# Patient Record
Sex: Female | Born: 1950 | Race: White | Hispanic: No | Smoking: Never smoker
Health system: Southern US, Community
[De-identification: ages and names within clinical notes are randomized; demographics above are authoritative.]

## PROBLEM LIST (undated history)

## (undated) DIAGNOSIS — R011 Cardiac murmur, unspecified: Secondary | ICD-10-CM

## (undated) DIAGNOSIS — F419 Anxiety disorder, unspecified: Secondary | ICD-10-CM

## (undated) DIAGNOSIS — G4730 Sleep apnea, unspecified: Secondary | ICD-10-CM

## (undated) DIAGNOSIS — G43909 Migraine, unspecified, not intractable, without status migrainosus: Secondary | ICD-10-CM

## (undated) DIAGNOSIS — K219 Gastro-esophageal reflux disease without esophagitis: Secondary | ICD-10-CM

## (undated) DIAGNOSIS — I10 Essential (primary) hypertension: Secondary | ICD-10-CM

## (undated) DIAGNOSIS — Z800 Family history of malignant neoplasm of digestive organs: Secondary | ICD-10-CM

## (undated) DIAGNOSIS — F32A Depression, unspecified: Secondary | ICD-10-CM

## (undated) DIAGNOSIS — M1990 Unspecified osteoarthritis, unspecified site: Secondary | ICD-10-CM

## (undated) DIAGNOSIS — F329 Major depressive disorder, single episode, unspecified: Secondary | ICD-10-CM

## (undated) DIAGNOSIS — E785 Hyperlipidemia, unspecified: Secondary | ICD-10-CM

## (undated) HISTORY — DX: Sleep apnea, unspecified: G47.30

## (undated) HISTORY — PX: OTHER SURGICAL HISTORY: SHX169

## (undated) HISTORY — DX: Depression, unspecified: F32.A

## (undated) HISTORY — DX: Cardiac murmur, unspecified: R01.1

## (undated) HISTORY — DX: Unspecified osteoarthritis, unspecified site: M19.90

## (undated) HISTORY — DX: Anxiety disorder, unspecified: F41.9

## (undated) HISTORY — DX: Gastro-esophageal reflux disease without esophagitis: K21.9

## (undated) HISTORY — DX: Major depressive disorder, single episode, unspecified: F32.9

## (undated) HISTORY — DX: Family history of malignant neoplasm of digestive organs: Z80.0

## (undated) HISTORY — DX: Essential (primary) hypertension: I10

## (undated) HISTORY — PX: CHOLECYSTECTOMY: SHX55

## (undated) HISTORY — DX: Migraine, unspecified, not intractable, without status migrainosus: G43.909

## (undated) HISTORY — PX: ABDOMINAL HYSTERECTOMY: SHX81

## (undated) HISTORY — DX: Hyperlipidemia, unspecified: E78.5

---

## 2001-04-30 ENCOUNTER — Encounter: Admission: RE | Admit: 2001-04-30 | Discharge: 2001-04-30 | Payer: Self-pay | Admitting: Family Medicine

## 2001-05-15 ENCOUNTER — Encounter: Admission: RE | Admit: 2001-05-15 | Discharge: 2001-05-15 | Payer: Self-pay | Admitting: Family Medicine

## 2001-08-15 ENCOUNTER — Other Ambulatory Visit: Admission: RE | Admit: 2001-08-15 | Discharge: 2001-08-15 | Payer: Self-pay | Admitting: Gynecology

## 2001-09-03 ENCOUNTER — Encounter: Admission: RE | Admit: 2001-09-03 | Discharge: 2001-09-03 | Payer: Self-pay | Admitting: Family Medicine

## 2002-05-06 ENCOUNTER — Encounter: Admission: RE | Admit: 2002-05-06 | Discharge: 2002-05-06 | Payer: Self-pay | Admitting: Family Medicine

## 2004-03-10 ENCOUNTER — Encounter: Admission: RE | Admit: 2004-03-10 | Discharge: 2004-03-10 | Payer: Self-pay | Admitting: Endocrinology

## 2004-03-16 ENCOUNTER — Encounter: Admission: RE | Admit: 2004-03-16 | Discharge: 2004-03-16 | Payer: Self-pay | Admitting: General Surgery

## 2004-12-26 ENCOUNTER — Other Ambulatory Visit: Admission: RE | Admit: 2004-12-26 | Discharge: 2004-12-26 | Payer: Self-pay | Admitting: Gynecology

## 2005-01-25 ENCOUNTER — Ambulatory Visit: Payer: Self-pay | Admitting: Internal Medicine

## 2005-01-31 ENCOUNTER — Ambulatory Visit: Payer: Self-pay | Admitting: Internal Medicine

## 2005-02-08 ENCOUNTER — Ambulatory Visit: Payer: Self-pay | Admitting: Internal Medicine

## 2005-09-19 ENCOUNTER — Ambulatory Visit: Payer: Self-pay | Admitting: Internal Medicine

## 2005-09-29 ENCOUNTER — Ambulatory Visit: Payer: Self-pay

## 2005-10-30 ENCOUNTER — Ambulatory Visit: Payer: Self-pay | Admitting: Internal Medicine

## 2006-07-26 DIAGNOSIS — M1990 Unspecified osteoarthritis, unspecified site: Secondary | ICD-10-CM | POA: Insufficient documentation

## 2006-07-26 DIAGNOSIS — I10 Essential (primary) hypertension: Secondary | ICD-10-CM

## 2007-07-30 LAB — CONVERTED CEMR LAB: Pap Smear: NORMAL

## 2007-08-07 ENCOUNTER — Ambulatory Visit: Payer: Self-pay | Admitting: Internal Medicine

## 2007-11-14 ENCOUNTER — Ambulatory Visit: Payer: Self-pay | Admitting: Internal Medicine

## 2007-11-14 LAB — CONVERTED CEMR LAB
ALT: 37 units/L — ABNORMAL HIGH (ref 0–35)
AST: 27 units/L (ref 0–37)
Albumin: 4 g/dL (ref 3.5–5.2)
Alkaline Phosphatase: 70 units/L (ref 39–117)
BUN: 10 mg/dL (ref 6–23)
Basophils Absolute: 0 10*3/uL (ref 0.0–0.1)
Basophils Relative: 0.6 % (ref 0.0–1.0)
Bilirubin, Direct: 0.1 mg/dL (ref 0.0–0.3)
CO2: 28 meq/L (ref 19–32)
CRP, High Sensitivity: <1 — ABNORMAL LOW (ref 0.00–5.00)
Calcium: 8.8 mg/dL (ref 8.4–10.5)
Chloride: 104 meq/L (ref 96–112)
Cholesterol: 179 mg/dL (ref 0–200)
Creatinine, Ser: 0.9 mg/dL (ref 0.4–1.2)
Eosinophils Absolute: 0.4 10*3/uL (ref 0.0–0.7)
Eosinophils Relative: 6.2 % — ABNORMAL HIGH (ref 0.0–5.0)
GFR calc Af Amer: 83 mL/min
GFR calc non Af Amer: 69 mL/min
Glucose, Bld: 108 mg/dL — ABNORMAL HIGH (ref 70–99)
HCT: 42.9 % (ref 36.0–46.0)
HDL: 39.9 mg/dL (ref >39.0)
Hemoglobin: 14.6 g/dL (ref 12.0–15.0)
LDL Cholesterol: 127 mg/dL — ABNORMAL HIGH (ref 0–99)
Lymphocytes Relative: 29.2 % (ref 12.0–46.0)
MCHC: 33.9 g/dL (ref 30.0–36.0)
MCV: 92.8 fL (ref 78.0–100.0)
Monocytes Absolute: 0.6 10*3/uL (ref 0.1–1.0)
Monocytes Relative: 8.9 % (ref 3.0–12.0)
Neutro Abs: 3.5 10*3/uL (ref 1.4–7.7)
Neutrophils Relative %: 55.1 % (ref 43.0–77.0)
Platelets: 326 10*3/uL (ref 150–400)
Potassium: 3.9 meq/L (ref 3.5–5.1)
RBC: 4.63 M/uL (ref 3.87–5.11)
RDW: 12.3 % (ref 11.5–14.6)
Sodium: 140 meq/L (ref 135–145)
TSH: 3.35 microintl units/mL (ref 0.35–5.50)
Total Bilirubin: 0.6 mg/dL (ref 0.3–1.2)
Total CHOL/HDL Ratio: 4.5
Total Protein: 6.7 g/dL (ref 6.0–8.3)
Triglycerides: 61 mg/dL (ref 0–149)
VLDL: 12 mg/dL (ref 0–40)
WBC: 6.3 10*3/uL (ref 4.5–10.5)

## 2007-11-17 ENCOUNTER — Encounter: Payer: Self-pay | Admitting: Internal Medicine

## 2007-12-05 ENCOUNTER — Ambulatory Visit: Payer: Self-pay | Admitting: Endocrinology

## 2009-12-31 ENCOUNTER — Ambulatory Visit (HOSPITAL_COMMUNITY): Admission: RE | Admit: 2009-12-31 | Discharge: 2009-12-31 | Payer: Self-pay | Admitting: Gynecology

## 2011-01-11 ENCOUNTER — Other Ambulatory Visit (HOSPITAL_COMMUNITY): Payer: Self-pay | Admitting: Gynecology

## 2011-01-11 DIAGNOSIS — Z1231 Encounter for screening mammogram for malignant neoplasm of breast: Secondary | ICD-10-CM

## 2011-01-19 ENCOUNTER — Ambulatory Visit (HOSPITAL_COMMUNITY)
Admission: RE | Admit: 2011-01-19 | Discharge: 2011-01-19 | Disposition: A | Discharge status: Home / Self Care | Payer: Self-pay | Source: Ambulatory Visit | Attending: Gynecology | Admitting: Gynecology

## 2011-01-19 DIAGNOSIS — Z1231 Encounter for screening mammogram for malignant neoplasm of breast: Secondary | ICD-10-CM

## 2014-07-27 ENCOUNTER — Ambulatory Visit (INDEPENDENT_AMBULATORY_CARE_PROVIDER_SITE_OTHER): Payer: No Typology Code available for payment source | Admitting: Family Medicine

## 2014-07-27 ENCOUNTER — Encounter: Payer: Self-pay | Admitting: Family Medicine

## 2014-07-27 VITALS — BP 144/92 | HR 72 | Temp 98.3°F | Ht 61.25 in | Wt 156.7 lb

## 2014-07-27 DIAGNOSIS — F329 Major depressive disorder, single episode, unspecified: Secondary | ICD-10-CM | POA: Insufficient documentation

## 2014-07-27 DIAGNOSIS — K219 Gastro-esophageal reflux disease without esophagitis: Secondary | ICD-10-CM

## 2014-07-27 DIAGNOSIS — F32A Depression, unspecified: Secondary | ICD-10-CM

## 2014-07-27 DIAGNOSIS — M1990 Unspecified osteoarthritis, unspecified site: Secondary | ICD-10-CM

## 2014-07-27 DIAGNOSIS — F419 Anxiety disorder, unspecified: Secondary | ICD-10-CM

## 2014-07-27 DIAGNOSIS — Z7189 Other specified counseling: Secondary | ICD-10-CM

## 2014-07-27 DIAGNOSIS — F418 Other specified anxiety disorders: Secondary | ICD-10-CM

## 2014-07-27 DIAGNOSIS — Z7689 Persons encountering health services in other specified circumstances: Secondary | ICD-10-CM

## 2014-07-27 DIAGNOSIS — I10 Essential (primary) hypertension: Secondary | ICD-10-CM

## 2014-07-27 MED ORDER — PAROXETINE HCL 10 MG PO TABS: 10.0000 mg | ORAL_TABLET | Freq: Every day | ORAL | Status: DC

## 2014-07-27 MED ORDER — LOSARTAN POTASSIUM-HCTZ 100-12.5 MG PO TABS: 1.0000 | ORAL_TABLET | Freq: Every day | ORAL | Status: DC

## 2014-07-27 NOTE — Progress Notes (Signed)
HPI:  Vicki Martin is here to establish care. Just moved back to [place] recently. Last PCP and physical: has been about three years ago - reports normal paps and mammos in the past.  Has the following chronic problems that require follow up and concerns today:  HTN: -reports long hx of HTN -meds: losartan-hctz 50-12.5 -denies: CP, SOB, DOE, swelling  HRT: -on estrogen-methyltestosterone 1.25-2.5 in the past after hysterectom - stopped a few months ago -s/p hysterectomy  GERD: -on protonix 40mg  daily -heartburn and acid daily despite this medication for the last few months -denies: dysphagia, vomiting, fevers, malaise  Dep/Anx: -husband with lung ca, family issues, and recent move - takes care of brother  -reports: lots of stress, poor sleep, depressed, worries a lot about everything -denies: thoughts self harm or SI  ROS negative for unless reported above: fevers, unintentional weight loss, hearing or vision loss, chest pain, palpitations, struggling to breath, hemoptysis, melena, hematochezia, hematuria, falls, loc, si, thoughts of self harm  Past Medical History  Diagnosis Date  . Hypertension   . Arthritis   . Depression   . GERD (gastroesophageal reflux disease)   . Heart murmur   . Sleep apnea     pt declines  . Migraines     Past Surgical History  Procedure Laterality Date  . Cholecystectomy    . Abdominal hysterectomy      reports complet but with ovaries retained    Family History  Problem Relation Age of Onset  . Rheum arthritis Mother   . Stomach cancer Mother   . Ovarian cancer Maternal Aunt   . Heart disease Father   . Hypertension Sister   . Lung cancer Brother     deceased  . Cancer Brother     deceased  . Hypertension Brother     History   Social History  . Marital Status: Married    Spouse Name: N/A  . Number of Children: N/A  . Years of Education: N/A   Social History Main Topics  . Smoking status: Never Smoker   .  Smokeless tobacco: Not on file  . Alcohol Use: No  . Drug Use: Not on file  . Sexual Activity: Not on file   Other Topics Concern  . 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  pantoprazole (PROTONIX) 40 MG tablet, , Disp: , Rfl:  .  losartan-hydrochlorothiazide (HYZAAR) 100-12.5 MG per tablet, Take 1 tablet by mouth daily., Disp: 30 tablet, Rfl: 3 .  PARoxetine (PAXIL) 10 MG tablet, Take 1 tablet (10 mg total) by mouth daily., Disp: 30 tablet, Rfl: 2  EXAM:  Filed Vitals:   07/27/14 1450  BP: 138/98  Pulse: 72  Temp: 98.3 F (36.8 C)    Body mass index is 29.36 kg/(m^2).  GENERAL: vitals reviewed and listed above, alert, oriented, appears well hydrated and in no acute distress  HEENT: atraumatic, conjunttiva clear, no obvious abnormalities on inspection of external nose and ears  NECK: no obvious masses on inspection  LUNGS: clear to auscultation bilaterally, no wheezes, rales or rhonchi, good air movement  CV: HRRR, no  peripheral edema  MS: moves all extremities without noticeable abnormality  PSYCH: pleasant and cooperative, no obvious depression or anxiety  ASSESSMENT AND PLAN:  Discussed the following assessment and plan:  Essential hypertension -discussed options and opted to increase medication -follow up in 1 month -lifestyle recs  Gastroesophageal reflux disease, esophagitis presence not specified -may be related to stress -lifestyle changes and trx for stress -GI referral if persists at close follow up  Anxiety and depression -discussed options, risks -she decided to try paxil -follow up in 1 month  Osteoarthritis, unspecified osteoarthritis type, unspecified site  Encounter  to establish care -We reviewed the PMH, PSH, FH, SH, Meds and Allergies. -We provided refills for any medications we will prescribe as needed. -We addressed current concerns per orders and patient instructions. -We have asked for records for pertinent exams, studies, vaccines and notes from previous providers. -We have advised patient to follow up per instructions below.  Needs HM exam, will do after address acute issues, advised to schedule as she leaves today  -Patient advised to return or notify a doctor immediately if symptoms worsen or persist or new concerns arise.  Patient Instructions  BEFORE YOU LEAVE: -follow up in 1 month, physical in 4 months  Start the paroxetine once daily for stress - do not stop this medication suddenly  Schedule mammogram  Take you blood pressure medications daily in the morning  We recommend the following healthy lifestyle measures: - eat a healthy diet consisting of lots of vegetables, fruits, beans, nuts, seeds, healthy meats such as white chicken and fish and whole grains.  - avoid fried foods, fast food, processed foods, sodas, red meet and other fattening foods.  - get a least 150 minutes of aerobic exercise per week.   Consider counseling  Gastroesophageal Reflux Disease, Adult Gastroesophageal reflux disease (GERD) happens when acid from your stomach flows up into the esophagus. When acid comes in contact with the esophagus, the acid causes soreness (inflammation) in the esophagus. Over time, GERD may create small holes (ulcers) in the lining of the esophagus. CAUSES   Increased body weight. This puts pressure on the stomach, making acid rise from the stomach into the esophagus.  Smoking. This increases acid production in the stomach.  Drinking alcohol. This causes decreased pressure in the lower esophageal sphincter (valve or ring of muscle between the esophagus and stomach), allowing acid from the stomach into the esophagus.  Late  evening meals and a full stomach. This increases pressure and acid production in the stomach.  A malformed lower esophageal sphincter. Sometimes, no cause is found. SYMPTOMS   Burning pain in the lower part of the mid-chest behind the breastbone and in the mid-stomach area. This may occur twice a week or more often.  Trouble swallowing.  Sore throat.  Dry cough.  Asthma-like symptoms including chest tightness, shortness of breath, or wheezing. DIAGNOSIS  Your caregiver may be able to diagnose GERD based on your symptoms. In some cases, X-rays and other tests may be done to check for complications or to check the condition of your stomach and esophagus. TREATMENT  Your caregiver may recommend over-the-counter or prescription medicines to help decrease acid production. Ask your caregiver before starting or adding any new medicines.  HOME CARE INSTRUCTIONS   Change the factors that you can control. Ask your caregiver for guidance concerning weight loss, quitting smoking, and alcohol consumption.  Avoid foods and drinks that make your symptoms worse, such as:  Caffeine or alcoholic drinks.  Chocolate.  Peppermint or mint flavorings.  Garlic and onions.  Spicy foods.  Citrus fruits, such as oranges, lemons, or limes.  Tomato-based foods such as sauce, chili, salsa, and pizza.  Fried and fatty foods.  Avoid lying down for the 3 hours prior to your bedtime or prior to taking a nap.  Eat small, frequent meals instead of large meals.  Wear loose-fitting clothing. Do not wear anything tight around your waist that causes pressure on your stomach.  Raise the head of your bed 6 to 8 inches with wood blocks to help you sleep. Extra pillows will not help.  Only take over-the-counter or prescription medicines for pain, discomfort, or fever as directed by your caregiver.  Do not take aspirin, ibuprofen, or other nonsteroidal anti-inflammatory drugs (NSAIDs). SEEK IMMEDIATE MEDICAL  CARE IF:   You have pain in your arms, neck, jaw, teeth, or back.  Your pain increases or changes in intensity or duration.  You develop nausea, vomiting, or sweating (diaphoresis).  You develop shortness of breath, or you faint.  Your vomit is green, yellow, black, or looks like coffee grounds or blood.  Your stool is red, bloody, or black. These symptoms could be signs of other problems, such as heart disease, gastric bleeding, or esophageal bleeding. MAKE SURE YOU:   Understand these instructions.  Will watch your condition.  Will get help right away if you are not doing well or get worse. Document Released: 02/22/2005 Document Revised: 08/07/2011 Document Reviewed: 12/02/2010 [facility] Patient Information 2015 [place], [place]. This information is not intended to replace advice given to you by your health care provider. Make sure you discuss any questions you have with your health care provider.       Colin Benton R.

## 2014-07-27 NOTE — Progress Notes (Signed)
Pre visit review using our clinic review tool, if applicable. No additional management support is needed unless otherwise documented below in the visit note. 

## 2014-07-27 NOTE — Patient Instructions (Signed)
BEFORE YOU LEAVE: -follow up in 1 month, physical in 4 months  Start the paroxetine once daily for stress - do not stop this medication suddenly  Schedule mammogram  Take you blood pressure medications daily in the morning  We recommend the following healthy lifestyle measures: - eat a healthy diet consisting of lots of vegetables, fruits, beans, nuts, seeds, healthy meats such as white chicken and fish and whole grains.  - avoid fried foods, fast food, processed foods, sodas, red meet and other fattening foods.  - get a least 150 minutes of aerobic exercise per week.   Consider counseling  Gastroesophageal Reflux Disease, Adult Gastroesophageal reflux disease (GERD) happens when acid from your stomach flows up into the esophagus. When acid comes in contact with the esophagus, the acid causes soreness (inflammation) in the esophagus. Over time, GERD may create small holes (ulcers) in the lining of the esophagus. CAUSES   Increased body weight. This puts pressure on the stomach, making acid rise from the stomach into the esophagus.  Smoking. This increases acid production in the stomach.  Drinking alcohol. This causes decreased pressure in the lower esophageal sphincter (valve or ring of muscle between the esophagus and stomach), allowing acid from the stomach into the esophagus.  Late evening meals and a full stomach. This increases pressure and acid production in the stomach.  A malformed lower esophageal sphincter. Sometimes, no cause is found. SYMPTOMS   Burning pain in the lower part of the mid-chest behind the breastbone and in the mid-stomach area. This may occur twice a week or more often.  Trouble swallowing.  Sore throat.  Dry cough.  Asthma-like symptoms including chest tightness, shortness of breath, or wheezing. DIAGNOSIS  Your caregiver may be able to diagnose GERD based on your symptoms. In some cases, X-rays and other tests may be done to check for  complications or to check the condition of your stomach and esophagus. TREATMENT  Your caregiver may recommend over-the-counter or prescription medicines to help decrease acid production. Ask your caregiver before starting or adding any new medicines.  HOME CARE INSTRUCTIONS   Change the factors that you can control. Ask your caregiver for guidance concerning weight loss, quitting smoking, and alcohol consumption.  Avoid foods and drinks that make your symptoms worse, such as:  Caffeine or alcoholic drinks.  Chocolate.  Peppermint or mint flavorings.  Garlic and onions.  Spicy foods.  Citrus fruits, such as oranges, lemons, or limes.  Tomato-based foods such as sauce, chili, salsa, and pizza.  Fried and fatty foods.  Avoid lying down for the 3 hours prior to your bedtime or prior to taking a nap.  Eat small, frequent meals instead of large meals.  Wear loose-fitting clothing. Do not wear anything tight around your waist that causes pressure on your stomach.  Raise the head of your bed 6 to 8 inches with wood blocks to help you sleep. Extra pillows will not help.  Only take over-the-counter or prescription medicines for pain, discomfort, or fever as directed by your caregiver.  Do not take aspirin, ibuprofen, or other nonsteroidal anti-inflammatory drugs (NSAIDs). SEEK IMMEDIATE MEDICAL CARE IF:   You have pain in your arms, neck, jaw, teeth, or back.  Your pain increases or changes in intensity or duration.  You develop nausea, vomiting, or sweating (diaphoresis).  You develop shortness of breath, or you faint.  Your vomit is green, yellow, black, or looks like coffee grounds or blood.  Your stool is red, bloody, or  black. These symptoms could be signs of other problems, such as heart disease, gastric bleeding, or esophageal bleeding. MAKE SURE YOU:   Understand these instructions.  Will watch your condition.  Will get help right away if you are not doing well  or get worse. Document Released: 02/22/2005 Document Revised: 08/07/2011 Document Reviewed: 12/02/2010 [facility] Patient Information 2015 [place], [place]. This information is not intended to replace advice given to you by your health care provider. Make sure you discuss any questions you have with your health care provider.

## 2014-07-28 ENCOUNTER — Telehealth: Payer: Self-pay | Admitting: Family Medicine

## 2014-07-28 NOTE — Telephone Encounter (Signed)
emmi emailed °

## 2014-08-10 ENCOUNTER — Telehealth: Payer: Self-pay | Admitting: Family Medicine

## 2014-08-10 NOTE — Telephone Encounter (Signed)
Pt needs new rx losartan-hctz 50-12.5mg  #90 with refills sent to [facility] [address]. Pt also take losartan-hctz 100-12.5mg .

## 2014-08-10 NOTE — Telephone Encounter (Signed)
Should only be taking the 100-12.5 that we sent at the last visit and she has refills of this. She should not be taking any other medications. Is suposed to have a f/u visit this month with me to recheck her blood pressure.

## 2014-08-11 NOTE — Telephone Encounter (Signed)
Patient informed. 

## 2014-08-25 ENCOUNTER — Ambulatory Visit: Payer: No Typology Code available for payment source | Admitting: Family Medicine

## 2014-08-27 ENCOUNTER — Other Ambulatory Visit: Payer: Self-pay | Admitting: Family Medicine

## 2014-08-27 MED ORDER — PANTOPRAZOLE SODIUM 40 MG PO TBEC: 40.0000 mg | DELAYED_RELEASE_TABLET | Freq: Every day | ORAL | Status: DC

## 2014-08-27 NOTE — Telephone Encounter (Signed)
Pt needs new pantoprazole 40 mg #90 w/refills send to [facility]

## 2014-08-27 NOTE — Telephone Encounter (Signed)
Rx sent 

## 2014-10-20 ENCOUNTER — Telehealth: Payer: Self-pay | Admitting: *Deleted

## 2014-10-20 DIAGNOSIS — Z1239 Encounter for other screening for malignant neoplasm of breast: Secondary | ICD-10-CM

## 2014-10-20 NOTE — Telephone Encounter (Signed)
Left message for pt to call back.  Need to know if pt had mammogram this year and exact date.  If not, okay to refer to [facility]?

## 2014-10-20 NOTE — Addendum Note (Signed)
Addended by: Townsend Roger D on: 10/20/2014 10:21 AM   Modules accepted: Orders

## 2014-10-20 NOTE — Telephone Encounter (Signed)
Pt states she has not had one in a couple of years.  Referral placed for mammogram

## 2014-10-29 ENCOUNTER — Other Ambulatory Visit: Payer: Self-pay | Admitting: Family Medicine

## 2014-11-25 ENCOUNTER — Encounter: Payer: No Typology Code available for payment source | Admitting: Family Medicine

## 2014-12-01 ENCOUNTER — Other Ambulatory Visit: Payer: Self-pay | Admitting: Family Medicine

## 2014-12-25 ENCOUNTER — Encounter: Payer: No Typology Code available for payment source | Admitting: Family Medicine

## 2014-12-31 ENCOUNTER — Other Ambulatory Visit: Payer: Self-pay | Admitting: Family Medicine

## 2015-02-08 ENCOUNTER — Other Ambulatory Visit: Payer: Self-pay | Admitting: Family Medicine

## 2015-03-13 ENCOUNTER — Other Ambulatory Visit: Payer: Self-pay | Admitting: Family Medicine

## 2015-04-17 ENCOUNTER — Other Ambulatory Visit: Payer: Self-pay | Admitting: Family Medicine

## 2015-05-18 ENCOUNTER — Other Ambulatory Visit: Payer: Self-pay | Admitting: Family Medicine

## 2015-06-23 ENCOUNTER — Other Ambulatory Visit: Payer: Self-pay | Admitting: Family Medicine

## 2015-07-13 ENCOUNTER — Telehealth: Payer: Self-pay | Admitting: Family Medicine

## 2015-07-13 NOTE — Telephone Encounter (Signed)
Patient is requesting to transfer from Dr. Maudie Mercury to Dr. Ronnald Ramp because [facility] is a closer location for her.

## 2015-07-14 NOTE — Telephone Encounter (Signed)
Ok with me, I've seen her once some time ago.

## 2015-07-15 NOTE — Telephone Encounter (Signed)
Ok with me 

## 2015-07-15 NOTE — Telephone Encounter (Signed)
Scheduled

## 2015-08-04 ENCOUNTER — Other Ambulatory Visit (INDEPENDENT_AMBULATORY_CARE_PROVIDER_SITE_OTHER): Payer: Medicare Other

## 2015-08-04 ENCOUNTER — Ambulatory Visit (INDEPENDENT_AMBULATORY_CARE_PROVIDER_SITE_OTHER): Payer: Medicare Other | Admitting: Internal Medicine

## 2015-08-04 ENCOUNTER — Encounter: Payer: Self-pay | Admitting: Internal Medicine

## 2015-08-04 VITALS — BP 122/86 | HR 80 | Temp 98.6°F | Ht 61.25 in | Wt 154.0 lb

## 2015-08-04 DIAGNOSIS — K2100 Gastro-esophageal reflux disease with esophagitis, without bleeding: Secondary | ICD-10-CM

## 2015-08-04 DIAGNOSIS — Z1211 Encounter for screening for malignant neoplasm of colon: Secondary | ICD-10-CM

## 2015-08-04 DIAGNOSIS — R011 Cardiac murmur, unspecified: Secondary | ICD-10-CM | POA: Insufficient documentation

## 2015-08-04 DIAGNOSIS — Z23 Encounter for immunization: Secondary | ICD-10-CM

## 2015-08-04 DIAGNOSIS — R0609 Other forms of dyspnea: Secondary | ICD-10-CM

## 2015-08-04 DIAGNOSIS — I447 Left bundle-branch block, unspecified: Secondary | ICD-10-CM

## 2015-08-04 DIAGNOSIS — E785 Hyperlipidemia, unspecified: Secondary | ICD-10-CM | POA: Insufficient documentation

## 2015-08-04 DIAGNOSIS — I10 Essential (primary) hypertension: Secondary | ICD-10-CM | POA: Diagnosis not present

## 2015-08-04 DIAGNOSIS — E781 Pure hyperglyceridemia: Secondary | ICD-10-CM

## 2015-08-04 DIAGNOSIS — R519 Headache, unspecified: Secondary | ICD-10-CM

## 2015-08-04 DIAGNOSIS — R51 Headache: Secondary | ICD-10-CM

## 2015-08-04 DIAGNOSIS — Z0000 Encounter for general adult medical examination without abnormal findings: Secondary | ICD-10-CM | POA: Insufficient documentation

## 2015-08-04 DIAGNOSIS — Z1159 Encounter for screening for other viral diseases: Secondary | ICD-10-CM | POA: Insufficient documentation

## 2015-08-04 DIAGNOSIS — R200 Anesthesia of skin: Secondary | ICD-10-CM

## 2015-08-04 DIAGNOSIS — R0600 Dyspnea, unspecified: Secondary | ICD-10-CM | POA: Insufficient documentation

## 2015-08-04 DIAGNOSIS — R208 Other disturbances of skin sensation: Secondary | ICD-10-CM

## 2015-08-04 LAB — LIPID PANEL
CHOL/HDL RATIO: 4
Cholesterol: 205 mg/dL — ABNORMAL HIGH (ref 0–200)
HDL: 49.7 mg/dL (ref >39.00)
NonHDL: 155.68
Triglycerides: 398 mg/dL — ABNORMAL HIGH (ref 0.0–149.0)
VLDL: 79.6 mg/dL — AB (ref 0.0–40.0)

## 2015-08-04 LAB — LDL CHOLESTEROL, DIRECT: Direct LDL: 99 mg/dL

## 2015-08-04 LAB — CBC WITH DIFFERENTIAL/PLATELET
Basophils Absolute: 0 10*3/uL (ref 0.0–0.1)
Basophils Relative: 0.5 % (ref 0.0–3.0)
EOS ABS: 0.3 10*3/uL (ref 0.0–0.7)
EOS PCT: 4.1 % (ref 0.0–5.0)
HCT: 42.3 % (ref 36.0–46.0)
HEMOGLOBIN: 14.5 g/dL (ref 12.0–15.0)
LYMPHS ABS: 1.9 10*3/uL (ref 0.7–4.0)
Lymphocytes Relative: 28 % (ref 12.0–46.0)
MCHC: 34.3 g/dL (ref 30.0–36.0)
MCV: 87.9 fl (ref 78.0–100.0)
MONO ABS: 0.6 10*3/uL (ref 0.1–1.0)
Monocytes Relative: 8.7 % (ref 3.0–12.0)
NEUTROS PCT: 58.7 % (ref 43.0–77.0)
Neutro Abs: 4.1 10*3/uL (ref 1.4–7.7)
Platelets: 354 10*3/uL (ref 150.0–400.0)
RBC: 4.81 Mil/uL (ref 3.87–5.11)
RDW: 13.5 % (ref 11.5–15.5)
WBC: 6.9 10*3/uL (ref 4.0–10.5)

## 2015-08-04 LAB — COMPREHENSIVE METABOLIC PANEL
ALBUMIN: 4.4 g/dL (ref 3.5–5.2)
ALK PHOS: 121 U/L — AB (ref 39–117)
ALT: 27 U/L (ref 0–35)
AST: 22 U/L (ref 0–37)
BUN: 10 mg/dL (ref 6–23)
CHLORIDE: 102 meq/L (ref 96–112)
CO2: 28 meq/L (ref 19–32)
CREATININE: 0.91 mg/dL (ref 0.40–1.20)
Calcium: 9.6 mg/dL (ref 8.4–10.5)
GFR: 65.95 mL/min (ref >60.00)
Glucose, Bld: 89 mg/dL (ref 70–99)
Potassium: 3.8 mEq/L (ref 3.5–5.1)
SODIUM: 139 meq/L (ref 135–145)
Total Bilirubin: 0.5 mg/dL (ref 0.2–1.2)
Total Protein: 7.1 g/dL (ref 6.0–8.3)

## 2015-08-04 LAB — BRAIN NATRIURETIC PEPTIDE: PRO B NATRI PEPTIDE: 20 pg/mL (ref 0.0–100.0)

## 2015-08-04 LAB — URINALYSIS, ROUTINE W REFLEX MICROSCOPIC
BILIRUBIN URINE: NEGATIVE
Ketones, ur: NEGATIVE
LEUKOCYTES UA: NEGATIVE
Nitrite: NEGATIVE
PH: 6.5 (ref 5.0–8.0)
Specific Gravity, Urine: <=1.005 — AB (ref 1.000–1.030)
TOTAL PROTEIN, URINE-UPE24: NEGATIVE
URINE GLUCOSE: NEGATIVE
Urobilinogen, UA: 0.2 (ref 0.0–1.0)

## 2015-08-04 LAB — TSH: TSH: 2.67 u[IU]/mL (ref 0.35–4.50)

## 2015-08-04 MED ORDER — DEXLANSOPRAZOLE 60 MG PO CPDR: 60.0000 mg | DELAYED_RELEASE_CAPSULE | Freq: Every day | ORAL | Status: DC

## 2015-08-04 NOTE — Progress Notes (Signed)
Subjective:  Patient ID: Vicki Martin, female    DOB: 14-Jan-1951  Age: 65 y.o. MRN: [id number]  CC: Annual Exam; Shortness of Breath; Hypertension; and Hyperlipidemia  NEW TO ME  HPI Vicki Martin presents for a CPX/AWV but she has multiple complaints.  She complains of a 3 month history of intermittent headaches that she describes as being in the frontal area more prominent on the left than the right. She describes very intermittent and very brief episodes of a stabbing sensation in her left lower forehead region that radiates into her left eye. The pain also occasionally radiates into her left ear. It also occasionally develops on the right side as well. She has had intermittent episodes of dizzinessl. She complains of bilateral extremity numbness but more severe on the left side than the right side. She denies ataxia, loss of vision, red eyes, weakness.  In addition she complains about a 2-3 month history of shortness of breath and dyspnea on exertion. She denies chest pain or syncope. She does have mild dizziness. She tells me that she has had a murmur since childhood but she doesn't know the cause of the murmur and doesn't think she's ever had an echocardiogram performed. She has a history of high blood pressure and tells me that her blood pressure has recently been well controlled on the combination of losartan and hydrochlorothiazide.  Additionally she complains of heartburn that is not well controlled with her current medication pantoprazole. She denies odynophagia, dysphagia, loss of appetite, or weight loss.  History Vicki Martin has a past medical history of Hypertension; Arthritis; Depression; GERD (gastroesophageal reflux disease); Heart murmur; Sleep apnea; and Migraines.   She has past surgical history that includes Cholecystectomy and Abdominal hysterectomy.   Her family history includes COPD in her brother; Cancer in her brother and mother; Early death in her brother; Heart  disease in her father; Hypertension in her brother and sister; Lung cancer in her brother; Ovarian cancer in her maternal aunt; Rheum arthritis in her mother; Stomach cancer in her mother. There is no history of Diabetes, Alcohol abuse, or Stroke.She reports that she has never smoked. She has never used smokeless tobacco. She reports that she does not drink alcohol or use illicit drugs.  Outpatient Prescriptions Prior to Visit  Medication Sig Dispense Refill  . losartan-hydrochlorothiazide (HYZAAR) 100-12.5 MG tablet TAKE ONE TABLET BY MOUTH ONCE DAILY 30 tablet 0  . PARoxetine (PAXIL) 10 MG tablet TAKE ONE TABLET BY MOUTH ONCE DAILY 30 tablet 0  . pantoprazole (PROTONIX) 40 MG tablet TAKE ONE TABLET BY MOUTH ONCE DAILY. NEED APPOINTMENT. 30 tablet 0   No facility-administered medications prior to visit.    ROS Review of Systems  Constitutional: Negative for fever, chills, diaphoresis, appetite change and fatigue.  HENT: Negative for congestion, sore throat, trouble swallowing and voice change.   Eyes: Positive for pain. Negative for photophobia, redness and visual disturbance.  Respiratory: Positive for shortness of breath. Negative for cough, choking, chest tightness, wheezing and stridor.   Cardiovascular: Negative.  Negative for chest pain, palpitations and leg swelling.  Gastrointestinal: Negative.  Negative for abdominal pain.  Endocrine: Negative.   Musculoskeletal: Negative.  Negative for back pain and neck pain.  Skin: Negative.  Negative for color change and rash.  Neurological: Positive for dizziness, numbness and headaches. Negative for tremors, seizures, syncope, facial asymmetry, speech difficulty, weakness and light-headedness.  Hematological: Negative.  Negative for adenopathy. Does not bruise/bleed easily.  Psychiatric/Behavioral: Negative.  Objective:  BP 122/86 mmHg  Pulse 80  Temp(Src) 98.6 F (37 C) (Oral)  Ht 5' 1.25" (1.556 m)  Wt 154 lb (69.854 kg)  BMI  28.85 kg/m2  SpO2 95%  Physical Exam  Constitutional: She is oriented to person, place, and time. She appears well-nourished.  Non-toxic appearance. She does not have a sickly appearance. She does not appear ill. No distress.  HENT:  Head: Normocephalic and atraumatic.  Mouth/Throat: Oropharynx is clear and moist. No oropharyngeal exudate.  Eyes: Conjunctivae and EOM are normal. Pupils are equal, round, and reactive to light. Right eye exhibits no discharge. Left eye exhibits no discharge. No scleral icterus.  Neck: Normal range of motion. Neck supple. No JVD present. No tracheal deviation present. No thyromegaly present.  Cardiovascular: Normal rate, regular rhythm, S1 normal, S2 normal and intact distal pulses.  Exam reveals no gallop, no S3 and no S4.   Murmur heard.  Decrescendo systolic murmur is present with a grade of 1/6   No diastolic murmur is present  Pulses:      Carotid pulses are 1+ on the right side, and 1+ on the left side.      Radial pulses are 1+ on the right side, and 1+ on the left side.       Femoral pulses are 1+ on the right side, and 1+ on the left side.      Popliteal pulses are 1+ on the right side, and 1+ on the left side.       Dorsalis pedis pulses are 1+ on the right side, and 1+ on the left side.       Posterior tibial pulses are 1+ on the right side, and 1+ on the left side.  EKG -  Sinus  Rhythm  -Left bundle branch block.   ABNORMAL    Pulmonary/Chest: Effort normal and breath sounds normal. No stridor. No respiratory distress. She has no wheezes. She has no rales. She exhibits no tenderness.  Abdominal: Soft. Bowel sounds are normal. She exhibits no distension and no mass. There is no tenderness. There is no rebound and no guarding.  Musculoskeletal: Normal range of motion. She exhibits no edema or tenderness.  Lymphadenopathy:    She has no cervical adenopathy.  Neurological: She is alert and oriented to person, place, and time. She has normal  strength. She displays no atrophy, no tremor and normal reflexes. No cranial nerve deficit or sensory deficit. She exhibits normal muscle tone. She displays a negative Romberg sign. She displays no seizure activity. Coordination and gait normal. She displays no Babinski's sign on the right side. She displays no Babinski's sign on the left side.  Reflex Scores:      Tricep reflexes are 0 on the right side and 0 on the left side.      Bicep reflexes are 0 on the right side and 0 on the left side.      Brachioradialis reflexes are 0 on the right side and 0 on the left side.      Patellar reflexes are 0 on the right side and 0 on the left side.      Achilles reflexes are 0 on the right side and 0 on the left side. Skin: Skin is warm and dry. No rash noted. She is not diaphoretic. No erythema. No pallor.  Psychiatric: She has a normal mood and affect. Her behavior is normal. Judgment and thought content normal.  Vitals reviewed.  Lab Results  Component Value  Date   WBC 6.9 08/04/2015   HGB 14.5 08/04/2015   HCT 42.3 08/04/2015   PLT 354.0 08/04/2015   GLUCOSE 89 08/04/2015   CHOL 205* 08/04/2015   TRIG 398.0* 08/04/2015   HDL 49.70 08/04/2015   LDLDIRECT 99.0 08/04/2015   LDLCALC 127* 11/14/2007   ALT 27 08/04/2015   AST 22 08/04/2015   NA 139 08/04/2015   K 3.8 08/04/2015   CL 102 08/04/2015   CREATININE 0.91 08/04/2015   BUN 10 08/04/2015   CO2 28 08/04/2015   TSH 2.67 08/04/2015    Assessment & Plan:   Vicki Martin was seen today for annual exam, shortness of breath, hypertension and hyperlipidemia.  Diagnoses and all orders for this visit:  Routine general medical examination at a health care facility  Need for hepatitis C screening test  Need for influenza vaccination -     Flu Vaccine QUAD 36+ mos IM  Need for Tdap vaccination -     Tdap vaccine greater than or equal to 7yo IM  Gastroesophageal reflux disease with esophagitis- I think Dexilant would be and more potent  proton pump inhibitor for her symptom relief. -     dexlansoprazole (DEXILANT) 60 MG capsule; Take 1 capsule (60 mg total) by mouth daily. -     CBC with Differential/Platelet; Future  Systolic murmur- she has symptoms and an abnormal EKG, I have asked her to follow-up with cardiology for further evaluation. She will need an echocardiogram. -     Brain natriuretic peptide; Future -     EKG 12-Lead -     Ambulatory referral to Cardiology  DOE (dyspnea on exertion)- her labs show no secondary causes for this, her EKG is abnormal and she has a murmur but her BNP is normal today so I don't think she has fluid overload, have asked her to see cardiology for further evaluations -     CBC with Differential/Platelet; Future -     Brain natriuretic peptide; Future -     Ambulatory referral to Cardiology  Hyperlipidemia with target LDL less than 130 -     Lipid panel; Future -     Comprehensive metabolic panel; Future -     TSH; Future  Essential hypertension- her blood pressures well controlled, electrolytes and renal function are stable. -     Urinalysis, Routine w reflex microscopic (not at [facility]); Future  Left bundle branch block (LBBB)- I have no old EKGs for comparison so I assume that this is new, I've asked her to have an evaluation with cardiology. -     Ambulatory referral to Cardiology  Intractable episodic headache, unspecified headache type- she will undergo an MRI testing to screen for mass, complications of an aneurysm, history of CNS bleeding, demyelinating disease. -     MR Brain Wo Contrast; Future  Numbness of upper extremity -     MR Brain Wo Contrast; Future  Screen for colon cancer -     Ambulatory referral to Gastroenterology  Hypertriglyceridemia - will start treating this with Vascepa -     Icosapent Ethyl (VASCEPA) 1 g CAPS; Take 2 capsules by mouth 2 (two) times daily.  Other orders -     Cancel: losartan-hydrochlorothiazide (HYZAAR) 100-12.5 MG tablet; Take 1  tablet by mouth daily.   I have discontinued Ms. Rizor's pantoprazole. I am also having her start on dexlansoprazole and Icosapent Ethyl. Additionally, I am having her maintain her PARoxetine, losartan-hydrochlorothiazide, and aspirin.  Meds ordered this encounter  Medications  . aspirin 81 MG tablet    Sig: Take 81 mg by mouth daily.  Marland Kitchen dexlansoprazole (DEXILANT) 60 MG capsule    Sig: Take 1 capsule (60 mg total) by mouth daily.    Dispense:  30 capsule    Refill:  5  . Icosapent Ethyl (VASCEPA) 1 g CAPS    Sig: Take 2 capsules by mouth 2 (two) times daily.    Dispense:  120 capsule    Refill:  11   See AVS for instructions about healthy living and anticipatory guidance.  Follow-up: Return in about 3 weeks (around 08/25/2015).  Scarlette Calico, MD

## 2015-08-04 NOTE — Progress Notes (Signed)
Pre visit review using our clinic review tool, if applicable. No additional management support is needed unless otherwise documented below in the visit note. 

## 2015-08-04 NOTE — Patient Instructions (Signed)

## 2015-08-05 ENCOUNTER — Encounter: Payer: Self-pay | Admitting: Internal Medicine

## 2015-08-05 DIAGNOSIS — E781 Pure hyperglyceridemia: Secondary | ICD-10-CM | POA: Insufficient documentation

## 2015-08-05 MED ORDER — ICOSAPENT ETHYL 1 G PO CAPS: 2.0000 | ORAL_CAPSULE | Freq: Two times a day (BID) | ORAL | Status: DC

## 2015-08-06 ENCOUNTER — Encounter: Payer: Self-pay | Admitting: Internal Medicine

## 2015-08-08 NOTE — Assessment & Plan Note (Signed)

## 2015-08-09 ENCOUNTER — Other Ambulatory Visit: Payer: Self-pay

## 2015-08-09 ENCOUNTER — Telehealth: Payer: Self-pay | Admitting: Internal Medicine

## 2015-08-09 MED ORDER — LOSARTAN POTASSIUM-HCTZ 100-12.5 MG PO TABS: 1.0000 | ORAL_TABLET | Freq: Every day | ORAL | Status: DC

## 2015-08-09 MED ORDER — PAROXETINE HCL 10 MG PO TABS: 10.0000 mg | ORAL_TABLET | Freq: Every day | ORAL | Status: DC

## 2015-08-09 NOTE — Telephone Encounter (Signed)
Refills have been sent.  

## 2015-08-09 NOTE — Telephone Encounter (Signed)
Pt states the pharmacy didn't get her losarton and the other 2 medications they did get are very expensive. Can you please call her to go over this with pt

## 2015-08-10 ENCOUNTER — Other Ambulatory Visit: Payer: Self-pay | Admitting: Internal Medicine

## 2015-08-10 MED ORDER — LOSARTAN POTASSIUM-HCTZ 100-12.5 MG PO TABS: 1.0000 | ORAL_TABLET | Freq: Every day | ORAL | Status: DC

## 2015-08-10 NOTE — Addendum Note (Signed)
Addended by: Earnstine Regal on: 08/10/2015 10:47 AM   Modules accepted: Orders

## 2015-08-12 ENCOUNTER — Encounter: Payer: Self-pay | Admitting: Internal Medicine

## 2015-08-12 ENCOUNTER — Other Ambulatory Visit: Payer: Self-pay | Admitting: Internal Medicine

## 2015-08-12 DIAGNOSIS — E781 Pure hyperglyceridemia: Secondary | ICD-10-CM

## 2015-08-12 DIAGNOSIS — Z01419 Encounter for gynecological examination (general) (routine) without abnormal findings: Secondary | ICD-10-CM | POA: Diagnosis not present

## 2015-08-12 DIAGNOSIS — K2100 Gastro-esophageal reflux disease with esophagitis, without bleeding: Secondary | ICD-10-CM

## 2015-08-12 DIAGNOSIS — N951 Menopausal and female climacteric states: Secondary | ICD-10-CM | POA: Diagnosis not present

## 2015-08-12 DIAGNOSIS — R1031 Right lower quadrant pain: Secondary | ICD-10-CM | POA: Diagnosis not present

## 2015-08-12 DIAGNOSIS — Z79890 Hormone replacement therapy (postmenopausal): Secondary | ICD-10-CM | POA: Diagnosis not present

## 2015-08-12 DIAGNOSIS — Z809 Family history of malignant neoplasm, unspecified: Secondary | ICD-10-CM | POA: Diagnosis not present

## 2015-08-12 DIAGNOSIS — Z6830 Body mass index (BMI) 30.0-30.9, adult: Secondary | ICD-10-CM | POA: Diagnosis not present

## 2015-08-12 DIAGNOSIS — Z1231 Encounter for screening mammogram for malignant neoplasm of breast: Secondary | ICD-10-CM | POA: Diagnosis not present

## 2015-08-12 MED ORDER — OMEPRAZOLE 40 MG PO CPDR: 40.0000 mg | DELAYED_RELEASE_CAPSULE | Freq: Every day | ORAL | Status: DC

## 2015-08-14 ENCOUNTER — Other Ambulatory Visit: Payer: Medicare Other

## 2015-08-15 ENCOUNTER — Ambulatory Visit
Admission: RE | Admit: 2015-08-15 | Discharge: 2015-08-15 | Disposition: A | Discharge status: Home / Self Care | Payer: Medicare Other | Source: Ambulatory Visit | Attending: Internal Medicine | Admitting: Internal Medicine

## 2015-08-15 DIAGNOSIS — R51 Headache: Secondary | ICD-10-CM | POA: Diagnosis not present

## 2015-08-15 DIAGNOSIS — R200 Anesthesia of skin: Secondary | ICD-10-CM

## 2015-08-15 DIAGNOSIS — R519 Headache, unspecified: Secondary | ICD-10-CM

## 2015-08-16 ENCOUNTER — Encounter: Payer: Self-pay | Admitting: Internal Medicine

## 2015-08-18 ENCOUNTER — Telehealth: Payer: Self-pay | Admitting: Genetic Counselor

## 2015-08-18 ENCOUNTER — Encounter: Payer: Self-pay | Admitting: Genetic Counselor

## 2015-08-18 NOTE — Telephone Encounter (Signed)
Verified insurance and address, lt mess and faxed referral letter to referring office; mailed out new pt packet; scheduled intake

## 2015-08-27 ENCOUNTER — Ambulatory Visit (AMBULATORY_SURGERY_CENTER): Payer: Self-pay | Admitting: *Deleted

## 2015-08-27 VITALS — Ht 64.0 in | Wt 157.4 lb

## 2015-08-27 DIAGNOSIS — Z1211 Encounter for screening for malignant neoplasm of colon: Secondary | ICD-10-CM

## 2015-08-27 MED ORDER — NA SULFATE-K SULFATE-MG SULF 17.5-3.13-1.6 GM/177ML PO SOLN: ORAL | Status: DC

## 2015-08-27 NOTE — Progress Notes (Signed)
Pt very nervous adout "being put to sleep".  I explained the sedation used and she has no further questions at this time  No egg or soy allergy  No anesthesia or intubation problems per pt  No diet medications taken

## 2015-09-01 ENCOUNTER — Ambulatory Visit (INDEPENDENT_AMBULATORY_CARE_PROVIDER_SITE_OTHER): Payer: Medicare Other | Admitting: Cardiovascular Disease

## 2015-09-01 ENCOUNTER — Encounter: Payer: Self-pay | Admitting: Cardiovascular Disease

## 2015-09-01 VITALS — BP 166/98 | HR 78 | Ht 64.0 in | Wt 157.4 lb

## 2015-09-01 DIAGNOSIS — R079 Chest pain, unspecified: Secondary | ICD-10-CM | POA: Diagnosis not present

## 2015-09-01 DIAGNOSIS — R011 Cardiac murmur, unspecified: Secondary | ICD-10-CM

## 2015-09-01 DIAGNOSIS — I10 Essential (primary) hypertension: Secondary | ICD-10-CM

## 2015-09-01 DIAGNOSIS — E785 Hyperlipidemia, unspecified: Secondary | ICD-10-CM | POA: Diagnosis not present

## 2015-09-01 DIAGNOSIS — R0609 Other forms of dyspnea: Secondary | ICD-10-CM | POA: Diagnosis not present

## 2015-09-01 MED ORDER — ATORVASTATIN CALCIUM 40 MG PO TABS: 40.0000 mg | ORAL_TABLET | Freq: Every day | ORAL | Status: DC

## 2015-09-01 NOTE — Progress Notes (Signed)
Cardiology Office Note   Date:  09/01/2015   ID:  Vicki Martin, DOB Aug 29, 1950, MRN [id number]  PCP:  Scarlette Calico, MD  Cardiologist:   Acie Fredrickson Wonda Cheng, MD   Chief Complaint  Patient presents with  . Heart Murmur   Problem List 1. Essential HTN 2. Murmur 3. Obstructive sleep apnea    -  Does not have  CPAP ( was not mentioned by her doctor )  4. Hyperlipidemia    History of Present Illness: Vicki Martin is a 65 y.o. female who presents for evaluation of heart murmur Also has had squeezing her chest for the past 3 months . Tightness  Not exertional , not related to twising of her torso .  Not related to exertion Lasts few seconds.   is associated with dyspnea.   Able to bring in the groceries without any problems  Gets SOB and dizzy when she walks too far when shopping   Has very high triglyceride levels.  Was not able to afford the medication .Marland Kitchen   Past Medical History  Diagnosis Date  . Hypertension   . Arthritis   . Depression   . GERD (gastroesophageal reflux disease)   . Heart murmur   . Migraines   . Anxiety   . Sleep apnea     pt declines CPAP  . Hyperlipidemia     no medications    Past Surgical History  Procedure Laterality Date  . Cholecystectomy    . Abdominal hysterectomy      reports complet but with ovaries retained  . Removed hip bone to replace vertebrae       Current Outpatient Prescriptions  Medication Sig Dispense Refill  . aspirin 81 MG tablet Take 81 mg by mouth daily.    . Biotin 5000 MCG TABS Take by mouth daily.    . Cyanocobalamin (VITAMIN B12 PO) Take 1 tablet by mouth daily.     . Flaxseed, Linseed, (FLAXSEED OIL PO) Take 1 tablet by mouth daily. With omega 3 1069m daily    . losartan-hydrochlorothiazide (HYZAAR) 100-12.5 MG tablet Take 1 tablet by mouth daily. 90 tablet 3  . Na Sulfate-K Sulfate-Mg Sulf (SUPREP BOWEL PREP) SOLN Suprep as directed, no substitutions 354 mL 0  . Omega-3 Fatty Acids (FISH OIL PO) Take  1 tablet by mouth daily. 1200 mg daily    . omeprazole (PRILOSEC) 40 MG capsule Take 1 capsule (40 mg total) by mouth daily. 90 capsule 3  . PARoxetine (PAXIL) 10 MG tablet Take 1 tablet (10 mg total) by mouth daily. 90 tablet 0  . POTASSIUM GLUCONATE PO Take 1 tablet by mouth daily.     .Marland Kitchenatorvastatin (LIPITOR) 40 MG tablet Take 1 tablet (40 mg total) by mouth daily. 30 tablet 11   No current facility-administered medications for this visit.    Allergies:   Codeine and Penicillins    Social History:  The patient  reports that she has never smoked. She has never used smokeless tobacco. She reports that she does not drink alcohol or use illicit drugs.   Family History:  The patient's family history includes COPD in her brother; Cancer in her brother and mother; Early death in her brother; Heart disease in her father; Hypertension in her brother and sister; Lung cancer in her brother; Ovarian cancer in her maternal aunt; Rheum arthritis in her mother; Stomach cancer in her maternal aunt and mother. There is no history of Diabetes, Alcohol abuse, Stroke, Colon cancer,  Esophageal cancer, or Rectal cancer.    ROS:  Please see the history of present illness.    Review of Systems: Constitutional:  denies fever, chills, diaphoresis, appetite change and fatigue.  HEENT: denies photophobia, eye pain, redness, hearing loss, ear pain, congestion, sore throat, rhinorrhea, sneezing, neck pain, neck stiffness and tinnitus.  Respiratory: denies SOB, DOE, cough, chest tightness, and wheezing.  Cardiovascular: admits to chest pain,    Gastrointestinal: denies nausea, vomiting, abdominal pain, diarrhea, constipation, blood in stool.  Genitourinary: denies dysuria, urgency, frequency, hematuria, flank pain and difficulty urinating.  Musculoskeletal: denies  myalgias, back pain, joint swelling, arthralgias and gait problem.   Skin: denies pallor, rash and wound.  Neurological: denies dizziness, seizures,  syncope, weakness, light-headedness, numbness and headaches.   Hematological: denies adenopathy, easy bruising, personal or family bleeding history.  Psychiatric/ Behavioral: denies suicidal ideation, mood changes, confusion, nervousness, sleep disturbance and agitation.       All other systems are reviewed and negative.    PHYSICAL EXAM: VS:  BP 166/98 mmHg  Pulse 78  Ht '5\' 4"'  (1.626 m)  Wt 157 lb 6.4 oz (71.396 kg)  BMI 27.00 kg/m2 , BMI Body mass index is 27 kg/(m^2). GEN: Well nourished, well developed, in no acute distress HEENT: normal Neck: no JVD, carotid bruits, or masses Cardiac: RRR; very soft systolic murmurs, rubs, or gallops,no edema  Respiratory:  clear to auscultation bilaterally, normal work of breathing GI: soft, nontender, nondistended, + BS MS: no deformity or atrophy Skin: warm and dry, no rash Neuro:  Strength and sensation are intact Psych: normal   EKG:  EKG is not ordered today.   Recent Labs: 08/04/2015: ALT 27; BUN 10; Creatinine, Ser 0.91; Hemoglobin 14.5; Platelets 354.0; Potassium 3.8; Pro B Natriuretic peptide (BNP) 20.0; Sodium 139; TSH 2.67    Lipid Panel    Component Value Date/Time   CHOL 205* 08/04/2015 1616   TRIG 398.0* 08/04/2015 1616   HDL 49.70 08/04/2015 1616   CHOLHDL 4 08/04/2015 1616   VLDL 79.6* 08/04/2015 1616   LDLCALC 127* 11/14/2007 0824   LDLDIRECT 99.0 08/04/2015 1616      Wt Readings from Last 3 Encounters:  09/01/15 157 lb 6.4 oz (71.396 kg)  08/27/15 157 lb 6.4 oz (71.396 kg)  08/04/15 154 lb (69.854 kg)      Other studies Reviewed: Additional studies/ records that were reviewed today include: . Review of the above records demonstrates:    ASSESSMENT AND PLAN:  1.  Chest pain  - has several risk factors Will get a Lexiscan myoview  2. Hyperlipidemia - was given a  Script for Vascepa - was too expensive Will try atorvastatin 40 mg a day . She needs to greatly improve her diet Eats lots of breat,  doughnuts, desserts Will add fenofibrate if trigs remain elevated.   3. Essential HTN:    Eats lots of salt.    Generally eats a very poor diet.   4. Murmur:   Has a soft systolic murmur.  She may have some mild valvular issues. Will likely get an echo later but at present , she needs to work on improvement of her diet, exercise regimine .   Current medicines are reviewed at length with the patient today.  The patient does not have concerns regarding medicines.  The following changes have been made:  no change  Labs/ tests ordered today include:   Orders Placed This Encounter  Procedures  . Lipid Profile  . Comp Met (  CMET)  . Myocardial Perfusion Imaging     Disposition:   FU with me in 6 month s      Nahser, Wonda Cheng, MD  09/01/2015 6:23 PM    [address] [place], [place], [place]  [address] Phone: [phone]; Fax: [phone]   [facility]  [address] [place], [place]  [address] [phone]   Fax [phone]

## 2015-09-01 NOTE — Patient Instructions (Signed)
Medication Instructions:  START Atorvastatin 40 mg once daily - in the evening   Labwork: Your physician recommends that you return for lab work in: 3 months (cholesterol, complete metabolic panel) You will need to FAST for this appointment - nothing to eat or drink after midnight the night before except water.   Testing/Procedures: Your physician has requested that you have a lexiscan myoview. For further information please visit [email or web address]. Please follow instruction sheet, as given.   Follow-Up: Your physician wants you to follow-up in: 6 months with Dr. Acie Fredrickson.  You will receive a reminder letter in the mail two months in advance. If you don't receive a letter, please call our office to schedule the follow-up appointment.   If you need a refill on your cardiac medications before your next appointment, please call your pharmacy.   Thank you for choosing [facility]! Christen Bame, RN [phone]

## 2015-09-06 ENCOUNTER — Telehealth (HOSPITAL_COMMUNITY): Payer: Self-pay | Admitting: *Deleted

## 2015-09-06 ENCOUNTER — Telehealth (HOSPITAL_COMMUNITY): Payer: Self-pay | Admitting: Radiology

## 2015-09-06 NOTE — Telephone Encounter (Signed)
Patient given detailed instructions per Myocardial Perfusion Study Information Sheet for the test on 09/08/2015 AT 7:15. Patient notified to arrive 15 minutes early and that it is imperative to arrive on time for appointment to keep from having the test rescheduled.  If you need to cancel or reschedule your appointment, please call the office within 24 hours of your appointment. Failure to do so may result in a cancellation of your appointment, and a $50 no show fee. Patient verbalized understanding.EHK

## 2015-09-06 NOTE — Telephone Encounter (Signed)
Left message on voicemail in reference to upcoming appointment scheduled for 09/08/15. Phone number given for a call back so details instructions can be given. Nicky Kras, Ranae Palms

## 2015-09-08 ENCOUNTER — Ambulatory Visit (HOSPITAL_COMMUNITY): Payer: Medicare Other | Attending: Cardiovascular Disease

## 2015-09-08 DIAGNOSIS — R0602 Shortness of breath: Secondary | ICD-10-CM | POA: Diagnosis not present

## 2015-09-08 DIAGNOSIS — R9439 Abnormal result of other cardiovascular function study: Secondary | ICD-10-CM | POA: Insufficient documentation

## 2015-09-08 DIAGNOSIS — I10 Essential (primary) hypertension: Secondary | ICD-10-CM | POA: Insufficient documentation

## 2015-09-08 DIAGNOSIS — R079 Chest pain, unspecified: Secondary | ICD-10-CM | POA: Insufficient documentation

## 2015-09-08 LAB — MYOCARDIAL PERFUSION IMAGING
CHL CUP NUCLEAR SSS: 10
CHL CUP RESTING HR STRESS: 64 {beats}/min
CSEPPHR: 104 {beats}/min
LV dias vol: 71 mL (ref 46–106)
LVSYSVOL: 30 mL
RATE: 0.35
SDS: 5
SRS: 5
TID: 0.96

## 2015-09-08 MED ORDER — TECHNETIUM TC 99M SESTAMIBI GENERIC - CARDIOLITE
31.5000 | Freq: Once | INTRAVENOUS | Status: AC | PRN
  Administered 2015-09-08: 32 via INTRAVENOUS

## 2015-09-08 MED ORDER — TECHNETIUM TC 99M SESTAMIBI GENERIC - CARDIOLITE
10.7000 | Freq: Once | INTRAVENOUS | Status: AC | PRN
  Administered 2015-09-08: 11 via INTRAVENOUS

## 2015-09-08 MED ORDER — REGADENOSON 0.4 MG/5ML IV SOLN
0.4000 mg | Freq: Once | INTRAVENOUS | Status: AC
  Administered 2015-09-08: 0.4 mg via INTRAVENOUS

## 2015-09-16 DIAGNOSIS — R1031 Right lower quadrant pain: Secondary | ICD-10-CM | POA: Diagnosis not present

## 2015-09-17 ENCOUNTER — Ambulatory Visit (AMBULATORY_SURGERY_CENTER): Payer: Medicare Other | Admitting: Internal Medicine

## 2015-09-17 ENCOUNTER — Encounter: Payer: Self-pay | Admitting: Internal Medicine

## 2015-09-17 VITALS — BP 105/55 | HR 60 | Temp 98.0°F | Resp 14 | Ht 64.0 in | Wt 157.0 lb

## 2015-09-17 DIAGNOSIS — K219 Gastro-esophageal reflux disease without esophagitis: Secondary | ICD-10-CM | POA: Diagnosis not present

## 2015-09-17 DIAGNOSIS — Z1211 Encounter for screening for malignant neoplasm of colon: Secondary | ICD-10-CM

## 2015-09-17 DIAGNOSIS — I10 Essential (primary) hypertension: Secondary | ICD-10-CM | POA: Diagnosis not present

## 2015-09-17 MED ORDER — SODIUM CHLORIDE 0.9 % IV SOLN: 500.0000 mL | INTRAVENOUS | Status: DC

## 2015-09-17 NOTE — Op Note (Signed)
[facility] Patient Name: Vicki Martin Procedure Date: 09/17/2015 2:55 PM MRN: [id number] Endoscopist: Docia Chuck. Henrene Pastor , MD Age: 65 Date of Birth: 1950/07/27 Gender: Female Procedure:                Colonoscopy Indications:              Screening for colorectal malignant neoplasm Medicines:                Monitored Anesthesia Care Procedure:                Pre-Anesthesia Assessment:                           - Prior to the procedure, a History and Physical                            was performed, and patient medications and                            allergies were reviewed. The patient's tolerance of                            previous anesthesia was also reviewed. The risks                            and benefits of the procedure and the sedation                            options and risks were discussed with the patient.                            All questions were answered, and informed consent                            was obtained. Prior Anticoagulants: The patient has                            taken no previous anticoagulant or antiplatelet                            agents. ASA Grade Assessment: II - A patient with                            mild systemic disease. After reviewing the risks                            and benefits, the patient was deemed in                            satisfactory condition to undergo the procedure.                           After obtaining informed consent, the colonoscope  was passed under direct vision. Throughout the                            procedure, the patient's blood pressure, pulse, and                            oxygen saturations were monitored continuously. The                            Model CF-HQ190L [id number]) scope was introduced                            through the anus and advanced to the the cecum,                            identified by the appendiceal orifice, IC valve and                          transillumination. The ileocecal valve, appendiceal                            orifice, and rectum were photographed. The quality                            of the bowel preparation was excellent. The                            colonoscopy was performed without difficulty. The                            patient tolerated the procedure well. The bowel                            preparation used was SUPREP. Scope In: 3:06:19 PM Scope Out: 3:21:03 PM Scope Withdrawal Time: 0 hours 10 minutes 30 seconds  Total Procedure Duration: 0 hours 14 minutes 44 seconds  Findings:                 The entire examined colon appeared normal on direct                            and retroflexion views. Complications:            No immediate complications. Estimated blood loss:                            None. Estimated Blood Loss:     Estimated blood loss: none. Impression:               - The entire examined colon is normal on direct and                            retroflexion views.                           - No specimens collected. Recommendation:           -  Repeat colonoscopy in 10 years for screening                            purposes.                           - Patient has a contact number available for                            emergencies. The signs and symptoms of potential                            delayed complications were discussed with the                            patient. Return to normal activities tomorrow.                            Written discharge instructions were provided to the                            patient.                           - Resume previous diet.                           - Continue present medications. Docia Chuck. Henrene Pastor, MD 09/17/2015 3:27:17 PM This report has been signed electronically. CC Letter to:             Janith Lima, MD

## 2015-09-17 NOTE — Progress Notes (Signed)
  [facility] Post-op Note  Patient: Vicki Martin  Procedure(s) Performed: colonoscopy  Patient Location: [facility] - Recovery Area  Anesthesia Type: Deep Sedation/Propofol  Level of Consciousness: awake, oriented and patient cooperative  Airway and Oxygen Therapy: Patient Spontanous Breathing  Post-op Pain: none  Post-op Assessment:  Post-op Vital signs reviewed, Patient's Cardiovascular Status Stable, Respiratory Function Stable, Patent Airway, No signs of Nausea or vomiting and Pain level controlled  Post-op Vital Signs: Reviewed and stable  Complications: No apparent anesthesia complications  Omayra Tulloch E 3:27 PM

## 2015-09-17 NOTE — Patient Instructions (Signed)
YOU HAD AN ENDOSCOPIC PROCEDURE TODAY AT [facility]:   Refer to the procedure report that was given to you for any specific questions about what was found during the examination.  If the procedure report does not answer your questions, please call your gastroenterologist to clarify.  If you requested that your care partner not be given the details of your procedure findings, then the procedure report has been included in a sealed envelope for you to review at your convenience later.  YOU SHOULD EXPECT: Some feelings of bloating in the abdomen. Passage of more gas than usual.  Walking can help get rid of the air that was put into your GI tract during the procedure and reduce the bloating. If you had a lower endoscopy (such as a colonoscopy or flexible sigmoidoscopy) you may notice spotting of blood in your stool or on the toilet paper. If you underwent a bowel prep for your procedure, you may not have a normal bowel movement for a few days.  Please Note:  You might notice some irritation and congestion in your nose or some drainage.  This is from the oxygen used during your procedure.  There is no need for concern and it should clear up in a day or so.  SYMPTOMS TO REPORT IMMEDIATELY:   Following lower endoscopy (colonoscopy or flexible sigmoidoscopy):  Excessive amounts of blood in the stool  Significant tenderness or worsening of abdominal pains  Swelling of the abdomen that is new, acute  Fever of 100F or higher  For urgent or emergent issues, a gastroenterologist can be reached at any hour by calling [phone].   DIET: Your first meal following the procedure should be a small meal and then it is ok to progress to your normal diet. Heavy or fried foods are harder to digest and may make you feel nauseous or bloated.  Likewise, meals heavy in dairy and vegetables can increase bloating.  Drink plenty of fluids but you should avoid alcoholic beverages for 24  hours.  ACTIVITY:  You should plan to take it easy for the rest of today and you should NOT DRIVE or use heavy machinery until tomorrow (because of the sedation medicines used during the test).    FOLLOW UP: Our staff will call the number listed on your records the next business day following your procedure to check on you and address any questions or concerns that you may have regarding the information given to you following your procedure. If we do not reach you, we will leave a message.  However, if you are feeling well and you are not experiencing any problems, there is no need to return our call.  We will assume that you have returned to your regular daily activities without incident.  If any biopsies were taken you will be contacted by phone or by letter within the next 1-3 weeks.  Please call us at [phone] if you have not heard about the biopsies in 3 weeks.    SIGNATURES/CONFIDENTIALITY: You and/or your care partner have signed paperwork which will be entered into your electronic medical record.  These signatures attest to the fact that that the information above on your After Visit Summary has been reviewed and is understood.  Full responsibility of the confidentiality of this discharge information lies with you and/or your care-partner.  Next routine colonoscopy 10 years.

## 2015-09-18 LAB — HM COLONOSCOPY

## 2015-09-20 ENCOUNTER — Encounter: Payer: Self-pay | Admitting: Genetic Counselor

## 2015-09-20 ENCOUNTER — Ambulatory Visit (HOSPITAL_BASED_OUTPATIENT_CLINIC_OR_DEPARTMENT_OTHER): Payer: Medicare Other | Admitting: Genetic Counselor

## 2015-09-20 ENCOUNTER — Other Ambulatory Visit: Payer: Medicare Other

## 2015-09-20 ENCOUNTER — Telehealth: Payer: Self-pay | Admitting: *Deleted

## 2015-09-20 DIAGNOSIS — Z800 Family history of malignant neoplasm of digestive organs: Secondary | ICD-10-CM | POA: Diagnosis not present

## 2015-09-20 LAB — HM COLONOSCOPY: HM Colonoscopy: ABNORMAL — AB

## 2015-09-20 NOTE — Telephone Encounter (Signed)
No answer, left message to call if questions or concerns. 

## 2015-09-20 NOTE — Progress Notes (Signed)
REFERRING PROVIDER: Janith Lima, MD [address] [place], [place] [address]  PRIMARY PROVIDER:  Scarlette Calico, MD  PRIMARY REASON FOR VISIT:  1. Family history of pancreatic cancer   2. Family history of stomach cancer      HISTORY OF PRESENT ILLNESS:   [name], a 65 y.o. female, was seen for a [facility] cancer genetics consultation at the request of Dr. Ronnald Ramp due to a family history of cancer.  [name] presents to clinic today to discuss the possibility of a hereditary predisposition to cancer, genetic testing, and to further clarify her future cancer risks, as well as potential cancer risks for family members. [name] is a 65 y.o. female with no personal history of cancer.    CANCER HISTORY:   No history exists.     HORMONAL RISK FACTORS:  Menarche was at age 32.  First live birth at age 22.  OCP use for approximately less than 5 years years.  Ovaries intact: yes.  Hysterectomy: yes.  Menopausal status: postmenopausal.  HRT use: 30 years. Colonoscopy: yes; normal. Mammogram within the last year: yes. Number of breast biopsies: 0. Up to date with pelvic exams:  yes. Any excessive radiation exposure in the past:  no  Past Medical History  Diagnosis Date  . Hypertension   . Arthritis   . Depression   . GERD (gastroesophageal reflux disease)   . Heart murmur   . Migraines   . Anxiety   . Sleep apnea     pt declines CPAP  . Hyperlipidemia     no medications  . Family history of pancreatic cancer   . Family history of stomach cancer     Past Surgical History  Procedure Laterality Date  . Cholecystectomy    . Abdominal hysterectomy      reports complet but with ovaries retained  . Removed hip bone to replace vertebrae      Social History   Social History  . Marital Status: Married    Spouse Name: N/A  . Number of Children: 3  . Years of Education: N/A   Social History Main Topics  . Smoking status: Never Smoker   . Smokeless  tobacco: Never Used  . Alcohol Use: No  . Drug Use: No  . Sexual Activity: Not Currently   Other Topics Concern  . 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  Significant diagnoses are listed below: Family History  Problem Relation Age of Onset  . Rheum arthritis Mother   . Stomach cancer Mother 22  . Pancreatic cancer Mother 51  . Stomach cancer Maternal Aunt   . Pancreatic cancer Maternal Aunt   . Heart disease Father   . Hypertension Sister   . Lung cancer Brother     deceased  . Early death Brother   . Liver cancer Brother   . Hypertension Brother   . COPD Brother   . Diabetes Neg Hx   . Alcohol abuse Neg Hx   . Stroke Neg Hx   . Colon cancer Neg Hx   .  Esophageal cancer Neg Hx   . Rectal cancer Neg Hx   . Cancer Maternal Uncle     NOS  . Cancer Maternal Aunt     NOS  . Lung cancer Other   . Ovarian cysts Daughter     The patient has two daughters and one son.  One daughter has ovarian cysts and is on a "watch list".  She had two sisters and five brothers.  One sister died at 34 month of age the other sister has a son who died of lung cancer.  One brother died of liver cancer and another brother died of lung cancer.  The patient does not have any information about her father's side of the family.  Her mother was diagnosed at age 39 with both pancreatic and stomach cancer.  Her mother had two sisters and two brothers.  One sister had pancreatic and stomach cancer, and the other sister had a cancer NOS.  One brother had cancer NOS.  Patient's maternal ancestors are of Caucasian descent, and paternal ancestors are of Caucasian descent. There is no reported Ashkenazi Jewish  ancestry. There is no known consanguinity.  GENETIC COUNSELING ASSESSMENT: [name] is a 65 y.o. female with a family history of stomach and pancreatic cancer which is somewhat suggestive of a familial vs sporadic diasese. We, therefore, discussed and recommended the following at today's visit.   DISCUSSION: We discussed that about 3% of stomach cancer and about 3-5% of pancreatic cancer is due to hereditary causes.  We reviewed the characteristics, features and inheritance patterns of hereditary cancer syndromes, including young ages of onset, related cancers and the number of individuals in a family who are affected. We discussed with Ms. Strum that the family history is not highly consistent with a familial hereditary cancer syndrome, and we feel she is at low risk to harbor a gene mutation associated with such a condition. Thus, we did not recommend any genetic testing, at this time, and recommended Ms. Morocco continue to follow the cancer screening guidelines given by her primary healthcare provider.  Ms. Guldan will look further into the cancer in her maternal aunt and uncle as well as the cancer that her brother died with to determine if any of these could be related and possibly increase her risk for a hereditary cancer syndrome.  She will contact me if she learns new information.  PLAN: We encouraged Ms. Zylstra to remain in contact with cancer genetics annually so that we can continuously update the family history and inform her of any changes in cancer genetics and testing that may be of benefit for this family.   Ms.  Anthis questions were answered to her satisfaction today. Our contact information was provided should additional questions or concerns arise. Thank you for the referral and allowing [place] to share in the care of your patient.   Karen P. Florene Glen, [place], [facility] Certified Genetic Counselor [email or web address] phone: [phone]  The patient was seen for a total  of 30 minutes in face-to-face genetic counseling.  This patient was discussed with Drs. Magrinat, Lindi Adie and/or Burr Medico who agrees with the above.    _______________________________________________________________________ For Office Staff:  Number of people involved in session: 1 Was an Intern/ student involved with case: no

## 2015-11-11 ENCOUNTER — Other Ambulatory Visit: Payer: Self-pay | Admitting: Internal Medicine

## 2015-12-01 ENCOUNTER — Other Ambulatory Visit (INDEPENDENT_AMBULATORY_CARE_PROVIDER_SITE_OTHER): Payer: Medicare Other | Admitting: *Deleted

## 2015-12-01 DIAGNOSIS — R079 Chest pain, unspecified: Secondary | ICD-10-CM | POA: Diagnosis not present

## 2015-12-01 DIAGNOSIS — E785 Hyperlipidemia, unspecified: Secondary | ICD-10-CM | POA: Diagnosis not present

## 2015-12-01 LAB — LIPID PANEL
CHOL/HDL RATIO: 2 ratio (ref <=5.0)
CHOLESTEROL: 100 mg/dL — AB (ref 125–200)
HDL: 51 mg/dL (ref >=46)
LDL Cholesterol: 28 mg/dL (ref <130)
Triglycerides: 105 mg/dL (ref <150)
VLDL: 21 mg/dL (ref <30)

## 2015-12-01 LAB — COMPREHENSIVE METABOLIC PANEL
ALBUMIN: 3.8 g/dL (ref 3.6–5.1)
ALK PHOS: 133 U/L — AB (ref 33–130)
ALT: 32 U/L — ABNORMAL HIGH (ref 6–29)
AST: 30 U/L (ref 10–35)
BILIRUBIN TOTAL: 0.5 mg/dL (ref 0.2–1.2)
BUN: 11 mg/dL (ref 7–25)
CALCIUM: 9 mg/dL (ref 8.6–10.4)
CO2: 24 mmol/L (ref 20–31)
Chloride: 104 mmol/L (ref 98–110)
Creat: 0.83 mg/dL (ref 0.50–0.99)
GLUCOSE: 94 mg/dL (ref 65–99)
Potassium: 3.8 mmol/L (ref 3.5–5.3)
Sodium: 140 mmol/L (ref 135–146)
Total Protein: 6.2 g/dL (ref 6.1–8.1)

## 2015-12-01 NOTE — Addendum Note (Signed)
Addended by: Eulis Foster on: 12/01/2015 02:57 PM   Modules accepted: Orders

## 2016-03-08 ENCOUNTER — Encounter: Payer: Self-pay | Admitting: Cardiovascular Disease

## 2016-03-09 ENCOUNTER — Other Ambulatory Visit (INDEPENDENT_AMBULATORY_CARE_PROVIDER_SITE_OTHER): Payer: Medicare Other

## 2016-03-09 ENCOUNTER — Encounter: Payer: Self-pay | Admitting: Family

## 2016-03-09 ENCOUNTER — Ambulatory Visit (INDEPENDENT_AMBULATORY_CARE_PROVIDER_SITE_OTHER): Payer: Medicare Other | Admitting: Family

## 2016-03-09 VITALS — BP 148/94 | HR 67 | Temp 98.3°F | Resp 16 | Ht 64.0 in | Wt 152.8 lb

## 2016-03-09 DIAGNOSIS — Z23 Encounter for immunization: Secondary | ICD-10-CM

## 2016-03-09 DIAGNOSIS — R200 Anesthesia of skin: Secondary | ICD-10-CM

## 2016-03-09 DIAGNOSIS — E538 Deficiency of other specified B group vitamins: Secondary | ICD-10-CM

## 2016-03-09 DIAGNOSIS — R7301 Impaired fasting glucose: Secondary | ICD-10-CM

## 2016-03-09 DIAGNOSIS — R202 Paresthesia of skin: Secondary | ICD-10-CM | POA: Diagnosis not present

## 2016-03-09 LAB — HEMOGLOBIN A1C: Hgb A1c MFr Bld: 5.5 % (ref 4.6–6.5)

## 2016-03-09 LAB — VITAMIN B12: Vitamin B-12: 656 pg/mL (ref 211–911)

## 2016-03-09 MED ORDER — GABAPENTIN 100 MG PO CAPS: 100.0000 mg | ORAL_CAPSULE | Freq: Every day | ORAL | 0 refills | Status: DC

## 2016-03-09 NOTE — Patient Instructions (Addendum)
Thank you for choosing [facility].  SUMMARY AND INSTRUCTIONS:  Medication:  Please start the gabapentin nightly for the numbness  Your prescription(s) have been submitted to your pharmacy or been printed and provided for you. Please take as directed and contact our office if you believe you are having problem(s) with the medication(s) or have any questions.  Labs:  Please stop by the lab on the lower level of the building for your blood work. Your results will be released to [facility] (or called to you) after review, usually within 72 hours after test completion. If any changes need to be made, you will be notified at that same time.  1.) The lab is open from 7:30am to 5:30 pm Monday-Friday 2.) No appointment is necessary 3.) Fasting (if needed) is 6-8 hours after food and drink; black coffee and water are okay   Follow up:  If your symptoms worsen or fail to improve, please contact our office for further instruction, or in case of emergency go directly to the emergency room at the closest medical facility.   Peripheral Neuropathy Peripheral neuropathy is a type of nerve damage. It affects nerves that carry signals between the spinal cord and other parts of the body. These are called peripheral nerves. With peripheral neuropathy, one nerve or a group of nerves may be damaged.  CAUSES  Many things can damage peripheral nerves. For some people with peripheral neuropathy, the cause is unknown. Some causes include:  Diabetes. This is the most common cause of peripheral neuropathy.  Injury to a nerve.  Pressure or stress on a nerve that lasts a long time.  Too little vitamin B. Alcoholism can lead to this.  Infections.  Autoimmune diseases, such as multiple sclerosis and systemic lupus erythematosus.  Inherited nerve diseases.  Some medicines, such as cancer drugs.  Toxic substances, such as lead and mercury.  Too little blood flowing to the legs.  Kidney  disease.  Thyroid disease. SIGNS AND SYMPTOMS  Different people have different symptoms. The symptoms you have will depend on which of your nerves is damaged. Common symptoms include:  Loss of feeling (numbness) in the feet and hands.  Tingling in the feet and hands.  Pain that burns.  Very sensitive skin.  Weakness.  Not being able to move a part of the body (paralysis).  Muscle twitching.  Clumsiness or poor coordination.  Loss of balance.  Not being able to control your bladder.  Feeling dizzy.  Sexual problems. DIAGNOSIS  Peripheral neuropathy is a symptom, not a disease. Finding the cause of peripheral neuropathy can be hard. To figure that out, your health care provider will take a medical history and do a physical exam. A neurological exam will also be done. This involves checking things affected by your brain, spinal cord, and nerves (nervous system). For example, your health care provider will check your reflexes, how you move, and what you can feel.  Other types of tests may also be ordered, such as:  Blood tests.  A test of the fluid in your spinal cord.  Imaging tests, such as CT scans or an MRI.  Electromyography (EMG). This test checks the nerves that control muscles.  Nerve conduction velocity tests. These tests check how fast messages pass through your nerves.  Nerve biopsy. A small piece of nerve is removed. It is then checked under a microscope. TREATMENT   Medicine is often used to treat peripheral neuropathy. Medicines may include:  Pain-relieving medicines. Prescription or over-the-counter medicine  may be suggested.  Antiseizure medicine. This may be used for pain.  Antidepressants. These also may help ease pain from neuropathy.  Lidocaine. This is a numbing medicine. You might wear a patch or be given a shot.  Mexiletine. This medicine is typically used to help control irregular heart rhythms.  Surgery. Surgery may be needed to relieve  pressure on a nerve or to destroy a nerve that is causing pain.  Physical therapy to help movement.  Assistive devices to help movement. HOME CARE INSTRUCTIONS   Only take over-the-counter or prescription medicines as directed by your health care provider. Follow the instructions carefully for any given medicines. Do not take any other medicines without first getting approval from your health care provider.  If you have diabetes, work closely with your health care provider to keep your blood sugar under control.  If you have numbness in your feet:  Check every day for signs of injury or infection. Watch for redness, warmth, and swelling.  Wear padded socks and comfortable shoes. These help protect your feet.  Do not do things that put pressure on your damaged nerve.  Do not smoke. Smoking keeps blood from getting to damaged nerves.  Avoid or limit alcohol. Too much alcohol can cause a lack of B vitamins. These vitamins are needed for healthy nerves.  Develop a good support system. Coping with peripheral neuropathy can be stressful. Talk to a mental health specialist or join a support group if you are struggling.  Follow up with your health care provider as directed. SEEK MEDICAL CARE IF:   You have new signs or symptoms of peripheral neuropathy.  You are struggling emotionally from dealing with peripheral neuropathy.  You have a fever. SEEK IMMEDIATE MEDICAL CARE IF:   You have an injury or infection that is not healing.  You feel very dizzy or begin vomiting.  You have chest pain.  You have trouble breathing.   This information is not intended to replace advice given to you by your health care provider. Make sure you discuss any questions you have with your health care provider.   Document Released: 05/05/2002 Document Revised: 01/25/2011 Document Reviewed: 01/20/2013 Elsevier Interactive Patient Education [employer].

## 2016-03-09 NOTE — Assessment & Plan Note (Signed)
Numbness and tingling of bilateral lower extremities greater on the left than on the right. Obtain A1c and B12 to rule out metabolic causes. Start gabapentin as needed for discomfort. May require additional referral to neurology for nerve conduction testing if metabolic blood work is negative.

## 2016-03-09 NOTE — Progress Notes (Signed)
Subjective:    Patient ID: Vicki Martin, female    DOB: June 13, 1950, 65 y.o.   MRN: [id number]  Chief Complaint  Patient presents with  . Foot Pain    x3 months that is now in both feet, stinging and burning in both feet, never been tested for diabetes    HPI:  Vicki Martin is a 65 y.o. female who  has a past medical history of Anxiety; Arthritis; Depression; Family history of pancreatic cancer; Family history of stomach cancer; GERD (gastroesophageal reflux disease); Heart murmur; Hyperlipidemia; Hypertension; Migraines; and Sleep apnea. and presents today for an office visit.   Foot pain - This is a new problem. Associated symptom of pain located in her bilateral feet and described as stinging and burning has been going on for about 3 months. There is also numbness and tingling. Denies any trauma or injury. Does have back pain on occasion. Left side is constant and the right side comes and goes. Severity of the symptoms is worse at night and does wake her from the sleep. Denies modifying factors that make it better or worse. Has tried a hot bath that seems to help with the throbbing.    Allergies  Allergen Reactions  . Codeine Other (See Comments)    Per patient "flips her out", sees things on the wall, sweats  . Penicillins Rash      Outpatient Medications Prior to Visit  Medication Sig Dispense Refill  . aspirin 81 MG tablet Take 81 mg by mouth daily.    Marland Kitchen atorvastatin (LIPITOR) 40 MG tablet Take 1 tablet (40 mg total) by mouth daily. 30 tablet 11  . Biotin 5000 MCG TABS Take by mouth daily.    . Cyanocobalamin (VITAMIN B12 PO) Take 1 tablet by mouth daily.     . Flaxseed, Linseed, (FLAXSEED OIL PO) Take 1 tablet by mouth daily. With omega 3 1000mg  daily    . losartan-hydrochlorothiazide (HYZAAR) 100-12.5 MG tablet Take 1 tablet by mouth daily. 90 tablet 3  . Omega-3 Fatty Acids (FISH OIL PO) Take 1 tablet by mouth daily. 1200 mg daily    . omeprazole (PRILOSEC) 40 MG  capsule Take 1 capsule (40 mg total) by mouth daily. 90 capsule 3  . PARoxetine (PAXIL) 10 MG tablet TAKE ONE TABLET BY MOUTH ONCE DAILY 90 tablet 3  . POTASSIUM GLUCONATE PO Take 1 tablet by mouth daily.      No facility-administered medications prior to visit.       Past Surgical History:  Procedure Laterality Date  . ABDOMINAL HYSTERECTOMY     reports complet but with ovaries retained  . CHOLECYSTECTOMY    . removed hip bone to replace vertebrae        Past Medical History:  Diagnosis Date  . Anxiety   . Arthritis   . Depression   . Family history of pancreatic cancer   . Family history of stomach cancer   . GERD (gastroesophageal reflux disease)   . Heart murmur   . Hyperlipidemia    no medications  . Hypertension   . Migraines   . Sleep apnea    pt declines CPAP      Review of Systems  Constitutional: Negative for chills and fever.  Eyes:       Negative for changes in vision.  Respiratory: Negative for chest tightness and shortness of breath.   Cardiovascular: Negative for chest pain.  Neurological: Positive for numbness.      Objective:  BP (!) 148/94 (BP Location: Left Arm, Patient Position: Sitting, Cuff Size: Normal)   Pulse 67   Temp 98.3 F (36.8 C) (Oral)   Resp 16   Ht 5\' 4"  (1.626 m)   Wt 152 lb 12.8 oz (69.3 kg)   SpO2 94%   BMI 26.23 kg/m  Nursing note and vital signs reviewed.  Physical Exam  Constitutional: She is oriented to person, place, and time. She appears well-developed and well-nourished. No distress.  Cardiovascular: Normal rate, regular rhythm, normal heart sounds and intact distal pulses.   Pulmonary/Chest: Effort normal and breath sounds normal.  Musculoskeletal:  Bilateral feet with no obvious deformity, discoloration, or edema. There is decreased sensation noted of bilateral feet to monofilament with left being greater than right. Vibration sensation is also decreased bilaterally. Distal pulses and sensation are  intact and appropriate.  Neurological: She is alert and oriented to person, place, and time.  Skin: Skin is warm and dry.  Psychiatric: She has a normal mood and affect. Her behavior is normal. Judgment and thought content normal.       Assessment & Plan:   Problem List Items Addressed This Visit      Other   Numbness and tingling of both lower extremities - Primary    Numbness and tingling of bilateral lower extremities greater on the left than on the right. Obtain A1c and B12 to rule out metabolic causes. Start gabapentin as needed for discomfort. May require additional referral to neurology for nerve conduction testing if metabolic blood work is negative.       Other Visit Diagnoses    Encounter for immunization       Relevant Orders   Flu vaccine HIGH DOSE PF (Completed)   Elevated fasting blood sugar       Relevant Orders   Hemoglobin A1c (Completed)   B12 deficiency       Relevant Orders   B12 (Completed)       I am having Vicki Martin start on gabapentin. I am also having her maintain her aspirin, losartan-hydrochlorothiazide, omeprazole, (Flaxseed, Linseed, (FLAXSEED OIL PO)), Omega-3 Fatty Acids (FISH OIL PO), Biotin, POTASSIUM GLUCONATE PO, Cyanocobalamin (VITAMIN B12 PO), atorvastatin, and PARoxetine.   Meds ordered this encounter  Medications  . gabapentin (NEURONTIN) 100 MG capsule    Sig: Take 1 capsule (100 mg total) by mouth at bedtime.    Dispense:  30 capsule    Refill:  0    Order Specific Question:   Supervising Provider    Answer:   Pricilla Holm A [id number]     Follow-up: Return if symptoms worsen or fail to improve.  Mauricio Po, FNP

## 2016-03-13 ENCOUNTER — Ambulatory Visit (INDEPENDENT_AMBULATORY_CARE_PROVIDER_SITE_OTHER): Payer: Medicare Other | Admitting: Internal Medicine

## 2016-03-13 ENCOUNTER — Encounter: Payer: Self-pay | Admitting: Internal Medicine

## 2016-03-13 ENCOUNTER — Ambulatory Visit: Payer: Medicare Other | Admitting: Internal Medicine

## 2016-03-13 VITALS — BP 140/90 | HR 73 | Temp 98.3°F | Resp 16 | Ht 64.0 in | Wt 155.0 lb

## 2016-03-13 DIAGNOSIS — M722 Plantar fascial fibromatosis: Secondary | ICD-10-CM

## 2016-03-13 MED ORDER — ETODOLAC 300 MG PO CAPS: 300.0000 mg | ORAL_CAPSULE | Freq: Two times a day (BID) | ORAL | 5 refills | Status: DC

## 2016-03-13 NOTE — Progress Notes (Signed)
Subjective:  Patient ID: Vicki Martin, female    DOB: 1951/03/14  Age: 65 y.o. MRN: [id number]  CC: Foot Pain   HPI Vicki Martin presents for Concerns about bilateral foot pain for about 2 months. She describes an aching and a stabbing sensation diffusely in her feet when she gets up in the morning and starts walking around barefooted on the cold floor. She says the pain is most prominent on the bottoms of her heels but it does radiate up the back of both ankles and then across the arches of both feet. She has tried Advil without much symptom relief. She was seen here week ago and the thought was that she had a neuropathy. She tried Neurontin but says it did not help her symptoms. Her blood sugars and B12 level were both normal.  Outpatient Medications Prior to Visit  Medication Sig Dispense Refill  . aspirin 81 MG tablet Take 81 mg by mouth daily.    Marland Kitchen atorvastatin (LIPITOR) 40 MG tablet Take 1 tablet (40 mg total) by mouth daily. 30 tablet 11  . Biotin 5000 MCG TABS Take by mouth daily.    . Cyanocobalamin (VITAMIN B12 PO) Take 1 tablet by mouth daily.     . Flaxseed, Linseed, (FLAXSEED OIL PO) Take 1 tablet by mouth daily. With omega 3 1000mg  daily    . losartan-hydrochlorothiazide (HYZAAR) 100-12.5 MG tablet Take 1 tablet by mouth daily. 90 tablet 3  . Omega-3 Fatty Acids (FISH OIL PO) Take 1 tablet by mouth daily. 1200 mg daily    . omeprazole (PRILOSEC) 40 MG capsule Take 1 capsule (40 mg total) by mouth daily. 90 capsule 3  . PARoxetine (PAXIL) 10 MG tablet TAKE ONE TABLET BY MOUTH ONCE DAILY 90 tablet 3  . POTASSIUM GLUCONATE PO Take 1 tablet by mouth daily.     Marland Kitchen gabapentin (NEURONTIN) 100 MG capsule Take 1 capsule (100 mg total) by mouth at bedtime. 30 capsule 0   No facility-administered medications prior to visit.     ROS Review of Systems  Constitutional: Negative.  Negative for activity change, appetite change, diaphoresis, fatigue and fever.  HENT: Negative.   Negative for sinus pressure and trouble swallowing.   Eyes: Negative.  Negative for visual disturbance.  Respiratory: Negative.  Negative for cough, choking, chest tightness, shortness of breath and stridor.   Cardiovascular: Negative.  Negative for chest pain, palpitations and leg swelling.  Gastrointestinal: Negative.  Negative for constipation.  Endocrine: Negative.   Genitourinary: Negative.  Negative for difficulty urinating.  Musculoskeletal: Positive for arthralgias. Negative for back pain, myalgias and neck pain.  Skin: Negative.   Allergic/Immunologic: Negative.   Neurological: Negative.  Negative for dizziness, weakness, numbness and headaches.  Hematological: Negative for adenopathy. Does not bruise/bleed easily.  Psychiatric/Behavioral: Negative.     Objective:  BP 140/90 (BP Location: Left Arm, Patient Position: Sitting, Cuff Size: Normal)   Pulse 73   Temp 98.3 F (36.8 C) (Oral)   Resp 16   Ht 5\' 4"  (1.626 m)   Wt 155 lb (70.3 kg)   SpO2 95%   BMI 26.61 kg/m   BP Readings from Last 3 Encounters:  03/13/16 140/90  03/09/16 (!) 148/94  09/17/15 (!) 105/55    Wt Readings from Last 3 Encounters:  03/13/16 155 lb (70.3 kg)  03/09/16 152 lb 12.8 oz (69.3 kg)  09/17/15 157 lb (71.2 kg)    Physical Exam  Constitutional: She is oriented to person, place, and  time. No distress.  HENT:  Mouth/Throat: Oropharynx is clear and moist. No oropharyngeal exudate.  Eyes: Conjunctivae are normal. Right eye exhibits no discharge. Left eye exhibits no discharge. No scleral icterus.  Neck: Normal range of motion. Neck supple. No JVD present. No tracheal deviation present. No thyromegaly present.  Cardiovascular: Normal rate, regular rhythm, normal heart sounds and intact distal pulses.  Exam reveals no gallop and no friction rub.   No murmur heard. Pulses:      Carotid pulses are 2+ on the right side, and 2+ on the left side.      Radial pulses are 2+ on the right side, and  2+ on the left side.       Femoral pulses are 1+ on the right side, and 0 on the left side.      Popliteal pulses are 1+ on the right side, and 1+ on the left side.       Dorsalis pedis pulses are 2+ on the right side, and 2+ on the left side.       Posterior tibial pulses are 1+ on the right side, and 1+ on the left side.  Pulmonary/Chest: Effort normal. No stridor. No respiratory distress. She has no wheezes. She has no rales. She exhibits no tenderness.  Abdominal: Soft. Bowel sounds are normal. She exhibits no distension and no mass. There is no tenderness. There is no rebound and no guarding.  Musculoskeletal: Normal range of motion. She exhibits no edema, tenderness or deformity.       Right foot: Normal. There is normal range of motion, no tenderness, no bony tenderness, no swelling, normal capillary refill and no deformity.       Left foot: Normal. There is normal range of motion, no tenderness, no bony tenderness, no swelling, normal capillary refill and no deformity.  Lymphadenopathy:    She has no cervical adenopathy.  Neurological: She is oriented to person, place, and time.  Skin: Skin is warm and dry. No rash noted. She is not diaphoretic. No erythema. No pallor.  Vitals reviewed.   Lab Results  Component Value Date   WBC 6.9 08/04/2015   HGB 14.5 08/04/2015   HCT 42.3 08/04/2015   PLT 354.0 08/04/2015   GLUCOSE 94 12/01/2015   CHOL 100 (L) 12/01/2015   TRIG 105 12/01/2015   HDL 51 12/01/2015   LDLDIRECT 99.0 08/04/2015   LDLCALC 28 12/01/2015   ALT 32 (H) 12/01/2015   AST 30 12/01/2015   NA 140 12/01/2015   K 3.8 12/01/2015   CL 104 12/01/2015   CREATININE 0.83 12/01/2015   BUN 11 12/01/2015   CO2 24 12/01/2015   TSH 2.67 08/04/2015   HGBA1C 5.5 03/09/2016    Mr Brain Wo Contrast  Result Date: 08/15/2015 CLINICAL DATA:  Headache and paresthesias. EXAM: MRI HEAD WITHOUT CONTRAST TECHNIQUE: Multiplanar, multiecho pulse sequences of the brain and surrounding  structures were obtained without intravenous contrast. COMPARISON:  None. FINDINGS: Calvarium and upper cervical spine: Prominent cervical facet arthropathy. No focal marrow lesion. Orbits: Negative. Sinuses and Mastoids: Clear. Brain: No specific explanation for headache. No acute remote infarct, hemorrhage, hydrocephalus, or mass lesion. No evidence of large vessel occlusion. Mild for age small foci of cerebral white matter signal abnormality with frontal predominance. This is likely related to chronic microvascular ischemia, especially given patient's history of hypertension, but complicated migraines would have the same appearance. No specific demyelinating pattern to explain paresthesias. IMPRESSION: 1. No specific or reversible abnormality. 2. Mild  white matter disease, likely secondary to patient's history of hypertension and migraine. Electronically Signed   By: Monte Fantasia M.D.   On: 08/15/2015 19:49    Assessment & Plan:   Abigal was seen today for foot pain.  Diagnoses and all orders for this visit:  Plantar fasciitis, bilateral- she will start treating this with high-dose anti-inflammatory therapy, I also given her patient education sheet regarding treatment modalities such as stretching and icing, she deferred on seeing a physical therapist today to be evaluated for orthotics, if symptoms don't improve soon then I may refer her to a podiatrist to consider steroid injections. -     etodolac (LODINE) 300 MG capsule; Take 1 capsule (300 mg total) by mouth 2 (two) times daily.   I have discontinued Ms. Crafts's gabapentin. I am also having her start on etodolac. Additionally, I am having her maintain her aspirin, losartan-hydrochlorothiazide, omeprazole, (Flaxseed, Linseed, (FLAXSEED OIL PO)), Omega-3 Fatty Acids (FISH OIL PO), Biotin, POTASSIUM GLUCONATE PO, Cyanocobalamin (VITAMIN B12 PO), atorvastatin, and PARoxetine.  Meds ordered this encounter  Medications  . etodolac (LODINE)  300 MG capsule    Sig: Take 1 capsule (300 mg total) by mouth 2 (two) times daily.    Dispense:  60 capsule    Refill:  5     Follow-up: Return in about 6 weeks (around 04/24/2016).  Scarlette Calico, MD

## 2016-03-13 NOTE — Patient Instructions (Signed)
Plantar Fasciitis Plantar fasciitis is a painful foot condition that affects the heel. It occurs when the band of tissue that connects the toes to the heel bone (plantar fascia) becomes irritated. This can happen after exercising too much or doing other repetitive activities (overuse injury). The pain from plantar fasciitis can range from mild irritation to severe pain that makes it difficult for you to walk or move. The pain is usually worse in the morning or after you have been sitting or lying down for a while. CAUSES This condition may be caused by:  Standing for long periods of time.  Wearing shoes that do not fit.  Doing high-impact activities, including running, aerobics, and ballet.  Being overweight.  Having an abnormal way of walking (gait).  Having tight calf muscles.  Having high arches in your feet.  Starting a new athletic activity. SYMPTOMS The main symptom of this condition is heel pain. Other symptoms include:  Pain that gets worse after activity or exercise.  Pain that is worse in the morning or after resting.  Pain that goes away after you walk for a few minutes. DIAGNOSIS This condition may be diagnosed based on your signs and symptoms. Your health care provider will also do a physical exam to check for:  A tender area on the bottom of your foot.  A high arch in your foot.  Pain when you move your foot.  Difficulty moving your foot. You may also need to have imaging studies to confirm the diagnosis. These can include:  X-rays.  Ultrasound.  MRI. TREATMENT  Treatment for plantar fasciitis depends on the severity of the condition. Your treatment may include:  Rest, ice, and over-the-counter pain medicines to manage your pain.  Exercises to stretch your calves and your plantar fascia.  A splint that holds your foot in a stretched, upward position while you sleep (night splint).  Physical therapy to relieve symptoms and prevent problems in the  future.  Cortisone injections to relieve severe pain.  Extracorporeal shock wave therapy (ESWT) to stimulate damaged plantar fascia with electrical impulses. It is often used as a last resort before surgery.  Surgery, if other treatments have not worked after 12 months. HOME CARE INSTRUCTIONS  Take medicines only as directed by your health care provider.  Avoid activities that cause pain.  Roll the bottom of your foot over a bag of ice or a bottle of cold water. Do this for 20 minutes, 3-4 times a day.  Perform simple stretches as directed by your health care provider.  Try wearing athletic shoes with air-sole or gel-sole cushions or soft shoe inserts.  Wear a night splint while sleeping, if directed by your health care provider.  Keep all follow-up appointments with your health care provider. PREVENTION   Do not perform exercises or activities that cause heel pain.  Consider finding low-impact activities if you continue to have problems.  Lose weight if you need to. The best way to prevent plantar fasciitis is to avoid the activities that aggravate your plantar fascia. SEEK MEDICAL CARE IF:  Your symptoms do not go away after treatment with home care measures.  Your pain gets worse.  Your pain affects your ability to move or do your daily activities.   This information is not intended to replace advice given to you by your health care provider. Make sure you discuss any questions you have with your health care provider.   Document Released: 02/07/2001 Document Revised: 02/03/2015 Document Reviewed: 03/25/2014 Elsevier   Interactive Patient Education 2016 [employer]vier Inc.  

## 2016-03-13 NOTE — Progress Notes (Signed)
Pre visit review using our clinic review tool, if applicable. No additional management support is needed unless otherwise documented below in the visit note. 

## 2016-03-21 ENCOUNTER — Ambulatory Visit (INDEPENDENT_AMBULATORY_CARE_PROVIDER_SITE_OTHER): Payer: Medicare Other | Admitting: Cardiovascular Disease

## 2016-03-21 ENCOUNTER — Encounter: Payer: Self-pay | Admitting: Cardiovascular Disease

## 2016-03-21 VITALS — BP 156/84 | HR 82 | Ht 62.0 in | Wt 155.8 lb

## 2016-03-21 DIAGNOSIS — I10 Essential (primary) hypertension: Secondary | ICD-10-CM | POA: Diagnosis not present

## 2016-03-21 DIAGNOSIS — I447 Left bundle-branch block, unspecified: Secondary | ICD-10-CM

## 2016-03-21 DIAGNOSIS — E785 Hyperlipidemia, unspecified: Secondary | ICD-10-CM | POA: Diagnosis not present

## 2016-03-21 MED ORDER — CARVEDILOL 6.25 MG PO TABS: 6.2500 mg | ORAL_TABLET | Freq: Two times a day (BID) | ORAL | 11 refills | Status: DC

## 2016-03-21 NOTE — Patient Instructions (Addendum)
Medication Instructions:  START Carvedilol (Coreg) 6.25 mg twice daily   Labwork: Your physician recommends that you return for lab work in: 6 months on the day of or a few days before your office visit with Dr. Acie Fredrickson.  You will need to FAST for this appointment - nothing to eat or drink after midnight the night before except water.    Testing/Procedures: None Ordered   Follow-Up: Your physician wants you to follow-up in: 6 months with Dr. Acie Fredrickson.  You will receive a reminder letter in the mail two months in advance. If you don't receive a letter, please call our office to schedule the follow-up appointment.   If you need a refill on your cardiac medications before your next appointment, please call your pharmacy.   Thank you for choosing [facility]! Christen Bame, RN [phone]

## 2016-03-21 NOTE — Progress Notes (Signed)
Cardiology Office Note   Date:  03/21/2016   ID:  Vicki Martin, DOB 19-Jun-1950, MRN [id number]  PCP:  Scarlette Calico, MD  Cardiologist:   Mertie Moores, MD   Chief Complaint  Patient presents with  . 6 month f/u   Problem List 1. Essential HTN 2. Murmur 3. Obstructive sleep apnea    -  Does not have  CPAP ( was not mentioned by her doctor )  4. Hyperlipidemia    Previous notes   Vicki Martin is a 65 y.o. female who presents for evaluation of heart murmur Also has had squeezing her chest for the past 3 months . Tightness  Not exertional , not related to twising of her torso .  Not related to exertion Lasts few seconds.   is associated with dyspnea.   Able to bring in the groceries without any problems  Gets SOB and dizzy when she walks too far when shopping   Has very high triglyceride levels.  Was not able to afford the medication ..   Oct. 24, 2017 Doing ok from a cardiac standpoint Has some occasional left sided chest pain .   Is not sleeping well.   She had a Myoview study in April, 2017 which revealed a apical defect that was most likely artifact. She has normal left ventricle systolic function. There is no evidence of ischemia. The study was intermediate risk.    Past Medical History:  Diagnosis Date  . Anxiety   . Arthritis   . Depression   . Family history of pancreatic cancer   . Family history of stomach cancer   . GERD (gastroesophageal reflux disease)   . Heart murmur   . Hyperlipidemia    no medications  . Hypertension   . Migraines   . Sleep apnea    pt declines CPAP    Past Surgical History:  Procedure Laterality Date  . ABDOMINAL HYSTERECTOMY     reports complet but with ovaries retained  . CHOLECYSTECTOMY    . 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 aspirin 81 MG tablet Take 81 mg by mouth daily.    Marland Kitchen atorvastatin (LIPITOR) 40 MG tablet Take 1  tablet (40 mg total) by mouth daily. 30 tablet 11  . Biotin 5000 MCG TABS Take by mouth daily.    . Cyanocobalamin (VITAMIN B12 PO) Take 1 tablet by mouth daily.     Marland Kitchen etodolac (LODINE) 300 MG capsule Take 1 capsule (300 mg total) by mouth 2 (two) times daily. 60 capsule 5  . etodolac (LODINE) 300 MG capsule Take 300 mg by mouth daily.    . Flaxseed, Linseed, (FLAXSEED OIL PO) Take 1 tablet by mouth daily. With omega 3 1000mg  daily    . losartan-hydrochlorothiazide (HYZAAR) 100-12.5 MG tablet Take 1 tablet by mouth daily. 90 tablet 3  . Omega-3 Fatty Acids (FISH OIL PO) Take 1 tablet by mouth daily. 1200 mg daily    . omeprazole (PRILOSEC) 40 MG capsule Take 1 capsule (40 mg total) by mouth daily. 90 capsule 3  . PARoxetine (PAXIL) 10 MG tablet TAKE ONE TABLET BY MOUTH ONCE DAILY 90 tablet 3  . POTASSIUM GLUCONATE PO Take 1 tablet by mouth daily.      No current facility-administered medications for this visit.     Allergies:   Codeine and Penicillins    Social History:  The patient  reports that she has never smoked. She has never used smokeless tobacco. She reports that she does not drink alcohol or use drugs.   Family History:  The patient's family history includes COPD in her brother; Cancer in her maternal aunt and maternal uncle; Early death in her brother; Heart disease in her father; Hypertension in her brother and sister; Liver cancer in her brother; Lung cancer in her brother and other; Ovarian cysts in her daughter; Pancreatic cancer in her maternal aunt; Pancreatic cancer (age of onset: 29) in her mother; Rheum arthritis in her mother; Stomach cancer in her maternal aunt; Stomach cancer (age of onset: 50) in her mother.    ROS:  Please see the history of present illness.    Review of Systems: Constitutional:  denies fever, chills, diaphoresis, appetite change and fatigue.  HEENT: denies photophobia, eye pain, redness, hearing loss, ear pain, congestion, sore throat,  rhinorrhea, sneezing, neck pain, neck stiffness and tinnitus.  Respiratory: denies SOB, DOE, cough, chest tightness, and wheezing.  Cardiovascular: admits to chest pain,    Gastrointestinal: denies nausea, vomiting, abdominal pain, diarrhea, constipation, blood in stool.  Genitourinary: denies dysuria, urgency, frequency, hematuria, flank pain and difficulty urinating.  Musculoskeletal: denies  myalgias, back pain, joint swelling, arthralgias and gait problem.   Skin: denies pallor, rash and wound.  Neurological: denies dizziness, seizures, syncope, weakness, light-headedness, numbness and headaches.   Hematological: denies adenopathy, easy bruising, personal or family bleeding history.  Psychiatric/ Behavioral: denies suicidal ideation, mood changes, confusion, nervousness, sleep disturbance and agitation.       All other systems are reviewed and negative.    PHYSICAL EXAM: VS:  BP (!) 156/84   Pulse 82   Ht 5\' 2"  (1.575 m)   Wt 155 lb 12.8 oz (70.7 kg)   SpO2 95%   BMI 28.50 kg/m  , BMI Body mass index is 28.5 kg/m. GEN: Well nourished, well developed, in no acute distress  HEENT: normal  Neck: no JVD, carotid bruits, or masses Cardiac: RRR; very soft systolic murmurs, rubs, or gallops,no edema  Respiratory:  clear to auscultation bilaterally, normal work of breathing GI: soft, nontender, nondistended, + BS MS: no deformity or atrophy  Skin: warm and dry, no rash Neuro:  Strength and sensation are intact Psych: normal   EKG:  EKG is not ordered today.   Recent Labs: 08/04/2015: Hemoglobin 14.5; Platelets 354.0; Pro B Natriuretic peptide (BNP) 20.0; TSH 2.67 12/01/2015: ALT 32; BUN 11; Creat 0.83; Potassium 3.8; Sodium 140    Lipid Panel    Component Value Date/Time   CHOL 100 (L) 12/01/2015 1457   TRIG 105 12/01/2015 1457   HDL 51 12/01/2015 1457   CHOLHDL 2.0 12/01/2015 1457   VLDL 21 12/01/2015 1457   LDLCALC 28 12/01/2015 1457   LDLDIRECT 99.0 08/04/2015 1616        Wt Readings from Last 3 Encounters:  03/21/16 155 lb 12.8 oz (70.7 kg)  03/13/16 155 lb (70.3 kg)  03/09/16 152 lb 12.8 oz (69.3 kg)      Other studies Reviewed: Additional studies/ records that were reviewed today include: . Review of the above records demonstrates:    ASSESSMENT AND PLAN:  1.  Chest pain  - has several risk factors Her Myoview study showed persistent anteroapical defect that was most likely breast artifact. She had normal left ventricle systolic function and the defect was not reversible. Will add Coreg 6. 25 BID   2. Hyperlipidemia - will recheck lipids at  her next visit    3. Essential HTN:    Eats lots of salt.    Generally eats a very poor diet.   4. Murmur:   Has a soft systolic murmur.  She may have some mild valvular issues. Will likely get an echo later but at present , she needs to work on improvement of her diet, exercise regimine .   Current medicines are reviewed at length with the patient today.  The patient does not have concerns regarding medicines.  The following changes have been made:  no change  Labs/ tests ordered today include:   No orders of the defined types were placed in this encounter.    Disposition:   FU with me in 6 month s      Mertie Moores, MD  03/21/2016 3:13 PM    [facility] [place], [place], [place]  [address] Phone: [phone]; Fax: [phone]

## 2016-06-26 ENCOUNTER — Telehealth: Payer: Self-pay | Admitting: Internal Medicine

## 2016-06-26 NOTE — Telephone Encounter (Signed)
Pt sent this message via the website:  Dr. Louie Bun, Please send a referral for me to see Dr. Ila Mcgill at [facility], [address], [place], [place] [address] - [phone]. My left foot is still giving me a great deal of pain and seems to be getting worse. The pain goes down the left edge of the left foot now and at times is really bad. I need to get some sort of relief with this condition. Thanks and if you need to get back to me you can call [phone] or [email or web address].

## 2016-06-27 NOTE — Telephone Encounter (Signed)
She needs to be seen so that I can know what diagnosis to use for a podiatry referral

## 2016-06-28 ENCOUNTER — Other Ambulatory Visit: Payer: Self-pay | Admitting: Internal Medicine

## 2016-06-28 DIAGNOSIS — M722 Plantar fascial fibromatosis: Secondary | ICD-10-CM

## 2016-06-28 NOTE — Telephone Encounter (Signed)
LVM for pt to call back as soon as possible.   RE: pt needs an appt.  

## 2016-06-28 NOTE — Telephone Encounter (Signed)
Pt called in, I told her she would need to schedule an appt with Dr. Ronnald Ramp but she said that he already diagnosed her with plantar fasciitis back in October, Dr. Ronnald Ramp placed referral to podiatry. MS

## 2016-07-18 ENCOUNTER — Encounter: Payer: Self-pay | Admitting: Podiatry

## 2016-07-18 ENCOUNTER — Ambulatory Visit (INDEPENDENT_AMBULATORY_CARE_PROVIDER_SITE_OTHER): Payer: Medicare Other

## 2016-07-18 ENCOUNTER — Ambulatory Visit (INDEPENDENT_AMBULATORY_CARE_PROVIDER_SITE_OTHER): Payer: Medicare Other | Admitting: Podiatry

## 2016-07-18 VITALS — BP 139/71 | HR 61 | Resp 16

## 2016-07-18 DIAGNOSIS — M722 Plantar fascial fibromatosis: Secondary | ICD-10-CM

## 2016-07-18 MED ORDER — MELOXICAM 15 MG PO TABS: 15.0000 mg | ORAL_TABLET | Freq: Every day | ORAL | 3 refills | Status: DC

## 2016-07-18 MED ORDER — METHYLPREDNISOLONE 4 MG PO TBPK: ORAL_TABLET | ORAL | 0 refills | Status: DC

## 2016-07-18 NOTE — Progress Notes (Signed)
   Subjective:    Patient ID: Vicki Martin, female    DOB: April 06, 1951, 66 y.o.   MRN: [id number]  HPI: She presents today as a 66 year old female with a chief complaint of a painful plantar heel bilaterally. She states that aching for the past several months left is worse than the right and noticed that the plantar lateral side of the left is a lot more sore recently. Mornings are particularly bad she saw her primary care provider who provided her with gabapentin for the plantar fasciitis with no help.    Review of Systems  All other systems reviewed and are negative.      Objective:   Physical Exam: Vital signs are stable she is alert and oriented 3. Pulses are palpable. Neurologic sensorium is intact. Deep tendon reflexes are intact. Muscle strength is intact. Orthopedic evaluation was resulted was distal to the ankle full range of motion without crepitation. Cutaneous evaluations traceable well-hydrated cutis no open lesions or wounds. She also has pain on palpation mucogingival of the left heel minimally so on the right heel. Radiographs taken today bilateral 3 views in the office demonstrating normal osseous architecture with exception of soft tissue increase in density of the plantar fascia or pain insertion site of the left heel much more so than the right.        Assessment & Plan:  Plantar fasciitis left over right.  Plan: Started on a Medrol Dosepak to be followed by meloxicam. Injected the left heel today with Kenalog and local anesthetic. Placed her plantar fascia brace to be followed by a night splint. Discussed appropriate shoe gear stretching exercises ice therapy issue modifications we discussed the etiology pathology conservative versus surgical therapies. I'll follow-up with her in 1 month.

## 2016-07-18 NOTE — Patient Instructions (Signed)

## 2016-07-25 ENCOUNTER — Encounter: Payer: Self-pay | Admitting: Cardiovascular Disease

## 2016-08-15 ENCOUNTER — Ambulatory Visit (INDEPENDENT_AMBULATORY_CARE_PROVIDER_SITE_OTHER): Payer: Medicare Other | Admitting: Podiatry

## 2016-08-15 ENCOUNTER — Encounter: Payer: Self-pay | Admitting: Podiatry

## 2016-08-15 DIAGNOSIS — M7752 Other enthesopathy of left foot: Secondary | ICD-10-CM | POA: Diagnosis not present

## 2016-08-15 DIAGNOSIS — M779 Enthesopathy, unspecified: Secondary | ICD-10-CM

## 2016-08-15 DIAGNOSIS — M778 Other enthesopathies, not elsewhere classified: Secondary | ICD-10-CM

## 2016-08-15 DIAGNOSIS — M722 Plantar fascial fibromatosis: Secondary | ICD-10-CM | POA: Diagnosis not present

## 2016-08-15 MED ORDER — CELECOXIB 200 MG PO CAPS: 200.0000 mg | ORAL_CAPSULE | Freq: Two times a day (BID) | ORAL | 1 refills | Status: DC

## 2016-08-15 NOTE — Progress Notes (Signed)
He presents today for follow-up of her plantar fasciitis. She states that the fasciitis is doing much better however the lateral aspect of her foot hurts. She does complain of some pain to the lateral aspect of the foot and also that the meloxicam bothers her as far as weight gain.  Objective: Vital signs are stable she is alert and oriented 3. Pulses are strongly palpable. She has no pain on palpation medial calcaneal tubercle she does have pain on palpation of the fourth and fifth metatarsal cuboid articulation.  Assessment: Lateral compensatory syndrome capsulitis. Plantar fasciitis resolving.  Plan: Injected the fourth fifth metatarsocuboid articulation area with Kenalog and local anesthetic she will continue all conservative therapies and I changed her meloxicam to Celebrex.

## 2016-08-18 ENCOUNTER — Other Ambulatory Visit: Payer: Self-pay | Admitting: Internal Medicine

## 2016-09-03 ENCOUNTER — Other Ambulatory Visit: Payer: Self-pay | Admitting: Internal Medicine

## 2016-09-03 ENCOUNTER — Other Ambulatory Visit: Payer: Self-pay | Admitting: Cardiovascular Disease

## 2016-09-03 DIAGNOSIS — K2100 Gastro-esophageal reflux disease with esophagitis, without bleeding: Secondary | ICD-10-CM

## 2016-09-14 ENCOUNTER — Ambulatory Visit (INDEPENDENT_AMBULATORY_CARE_PROVIDER_SITE_OTHER): Payer: Medicare Other | Admitting: Podiatry

## 2016-09-14 ENCOUNTER — Encounter: Payer: Self-pay | Admitting: Podiatry

## 2016-09-14 DIAGNOSIS — M722 Plantar fascial fibromatosis: Secondary | ICD-10-CM | POA: Diagnosis not present

## 2016-09-14 DIAGNOSIS — M7752 Other enthesopathy of left foot: Secondary | ICD-10-CM | POA: Diagnosis not present

## 2016-09-14 DIAGNOSIS — M779 Enthesopathy, unspecified: Secondary | ICD-10-CM

## 2016-09-14 DIAGNOSIS — M778 Other enthesopathies, not elsewhere classified: Secondary | ICD-10-CM

## 2016-09-14 NOTE — Progress Notes (Signed)
She presents today for follow-up of her plantar fasciitis she states that is proximally 90% better and she refers to her right heel.  Objective: Vital signs are stable she is alert and oriented 3 she still has pain on palpation medial calcaneal tubercle of the right heel.  Assessment: Plantar fasciitis right.  Plan: Reinjected the area today. She will continue her anti-inflammatories and follow-up with me in 1 month.

## 2016-10-26 ENCOUNTER — Ambulatory Visit: Payer: Medicare Other | Admitting: Podiatry

## 2016-11-18 ENCOUNTER — Other Ambulatory Visit: Payer: Self-pay | Admitting: Internal Medicine

## 2016-11-26 ENCOUNTER — Other Ambulatory Visit: Payer: Self-pay | Admitting: Internal Medicine

## 2016-12-01 ENCOUNTER — Other Ambulatory Visit: Payer: Self-pay | Admitting: Internal Medicine

## 2016-12-05 ENCOUNTER — Other Ambulatory Visit: Payer: Self-pay | Admitting: Internal Medicine

## 2016-12-05 NOTE — Telephone Encounter (Signed)
Called regarding these medications, appt set up for 7/16  [facility] on [address]

## 2016-12-11 ENCOUNTER — Encounter: Payer: Self-pay | Admitting: Internal Medicine

## 2016-12-11 ENCOUNTER — Other Ambulatory Visit (INDEPENDENT_AMBULATORY_CARE_PROVIDER_SITE_OTHER): Payer: Medicare Other

## 2016-12-11 ENCOUNTER — Ambulatory Visit (INDEPENDENT_AMBULATORY_CARE_PROVIDER_SITE_OTHER): Payer: Medicare Other | Admitting: Internal Medicine

## 2016-12-11 ENCOUNTER — Other Ambulatory Visit: Payer: Medicare Other

## 2016-12-11 VITALS — BP 130/84 | HR 65 | Temp 98.1°F | Resp 16 | Ht 62.0 in | Wt 162.2 lb

## 2016-12-11 DIAGNOSIS — F450 Somatization disorder: Secondary | ICD-10-CM | POA: Diagnosis not present

## 2016-12-11 DIAGNOSIS — E7800 Pure hypercholesterolemia, unspecified: Secondary | ICD-10-CM | POA: Diagnosis not present

## 2016-12-11 DIAGNOSIS — E2839 Other primary ovarian failure: Secondary | ICD-10-CM

## 2016-12-11 DIAGNOSIS — E781 Pure hyperglyceridemia: Secondary | ICD-10-CM

## 2016-12-11 DIAGNOSIS — G2581 Restless legs syndrome: Secondary | ICD-10-CM | POA: Diagnosis not present

## 2016-12-11 DIAGNOSIS — I10 Essential (primary) hypertension: Secondary | ICD-10-CM

## 2016-12-11 DIAGNOSIS — Z23 Encounter for immunization: Secondary | ICD-10-CM | POA: Diagnosis not present

## 2016-12-11 DIAGNOSIS — F32A Depression, unspecified: Secondary | ICD-10-CM

## 2016-12-11 DIAGNOSIS — F329 Major depressive disorder, single episode, unspecified: Secondary | ICD-10-CM | POA: Diagnosis not present

## 2016-12-11 DIAGNOSIS — Z0000 Encounter for general adult medical examination without abnormal findings: Secondary | ICD-10-CM

## 2016-12-11 LAB — COMPREHENSIVE METABOLIC PANEL
ALK PHOS: 109 U/L (ref 39–117)
ALT: 22 U/L (ref 0–35)
AST: 20 U/L (ref 0–37)
Albumin: 4 g/dL (ref 3.5–5.2)
BILIRUBIN TOTAL: 0.6 mg/dL (ref 0.2–1.2)
BUN: 14 mg/dL (ref 6–23)
CO2: 28 mEq/L (ref 19–32)
CREATININE: 0.93 mg/dL (ref 0.40–1.20)
Calcium: 9.2 mg/dL (ref 8.4–10.5)
Chloride: 105 mEq/L (ref 96–112)
GFR: 64.04 mL/min (ref >60.00)
GLUCOSE: 90 mg/dL (ref 70–99)
Potassium: 3.6 mEq/L (ref 3.5–5.1)
SODIUM: 141 meq/L (ref 135–145)
TOTAL PROTEIN: 6.4 g/dL (ref 6.0–8.3)

## 2016-12-11 LAB — CBC WITH DIFFERENTIAL/PLATELET
BASOS ABS: 0.1 10*3/uL (ref 0.0–0.1)
Basophils Relative: 1.5 % (ref 0.0–3.0)
Eosinophils Absolute: 0.2 10*3/uL (ref 0.0–0.7)
Eosinophils Relative: 3.1 % (ref 0.0–5.0)
HCT: 39.6 % (ref 36.0–46.0)
Hemoglobin: 13.3 g/dL (ref 12.0–15.0)
LYMPHS ABS: 1.3 10*3/uL (ref 0.7–4.0)
Lymphocytes Relative: 18.8 % (ref 12.0–46.0)
MCHC: 33.6 g/dL (ref 30.0–36.0)
MCV: 90.5 fl (ref 78.0–100.0)
MONO ABS: 0.7 10*3/uL (ref 0.1–1.0)
Monocytes Relative: 9.6 % (ref 3.0–12.0)
NEUTROS PCT: 67 % (ref 43.0–77.0)
Neutro Abs: 4.7 10*3/uL (ref 1.4–7.7)
PLATELETS: 287 10*3/uL (ref 150.0–400.0)
RBC: 4.38 Mil/uL (ref 3.87–5.11)
RDW: 13.3 % (ref 11.5–15.5)
WBC: 6.9 10*3/uL (ref 4.0–10.5)

## 2016-12-11 LAB — LIPID PANEL
Cholesterol: 131 mg/dL (ref 0–200)
HDL: 57.3 mg/dL (ref >39.00)
LDL Cholesterol: 40 mg/dL (ref 0–99)
NONHDL: 73.87
Total CHOL/HDL Ratio: 2
Triglycerides: 167 mg/dL — ABNORMAL HIGH (ref 0.0–149.0)
VLDL: 33.4 mg/dL (ref 0.0–40.0)

## 2016-12-11 MED ORDER — DULOXETINE HCL 30 MG PO CPEP: 30.0000 mg | ORAL_CAPSULE | Freq: Every day | ORAL | 3 refills | Status: DC

## 2016-12-11 MED ORDER — PREGABALIN 75 MG PO CAPS: 75.0000 mg | ORAL_CAPSULE | Freq: Every day | ORAL | 0 refills | Status: DC

## 2016-12-11 NOTE — Progress Notes (Signed)
Subjective:  Patient ID: Vicki Martin, female    DOB: 07-21-1950  Age: 66 y.o. MRN: [id number]  CC: Hypertension and Depression   HPI Vicki Martin presents for f/up - She complains of trouble sleeping. She has a history of restless leg syndrome and tells me that at night she can't relax the muscles in her arms and legs. She feels tension in her body and feels the urge to move her legs. It looks like she was previously prescribed gabapentin but she said it didn't help so she's not been taking it. She also struggles with chronic pain and depression. She wants to try different antidepressant. She complains of fatigue, anhedonia, irritability, and feeling hopeless and helpless. She denies SI or HI.  Outpatient Medications Prior to Visit  Medication Sig Dispense Refill  . aspirin 81 MG tablet Take 81 mg by mouth daily.    Marland Kitchen atorvastatin (LIPITOR) 40 MG tablet TAKE ONE TABLET BY MOUTH ONCE DAILY 90 tablet 3  . carvedilol (COREG) 6.25 MG tablet Take 1 tablet (6.25 mg total) by mouth 2 (two) times daily. 60 tablet 11  . celecoxib (CELEBREX) 200 MG capsule Take 1 capsule (200 mg total) by mouth 2 (two) times daily. 60 capsule 1  . losartan-hydrochlorothiazide (HYZAAR) 100-12.5 MG tablet TAKE 1 TABLET BY MOUTH ONCE DAILY 90 tablet 1  . omeprazole (PRILOSEC) 40 MG capsule TAKE ONE CAPSULE BY MOUTH ONCE DAILY 90 capsule 1  . gabapentin (NEURONTIN) 100 MG capsule Take 100 mg by mouth at bedtime.    . meloxicam (MOBIC) 15 MG tablet Take 1 tablet (15 mg total) by mouth daily. 30 tablet 3  . Multiple Vitamin (MULTIVITAMIN) capsule Take 1 capsule by mouth daily.    Marland Kitchen PARoxetine (PAXIL) 10 MG tablet TAKE ONE TABLET BY MOUTH ONCE DAILY 90 tablet 1   No facility-administered medications prior to visit.     ROS Review of Systems  Constitutional: Positive for fatigue. Negative for activity change, appetite change, diaphoresis and unexpected weight change.  HENT: Negative.   Eyes: Negative for visual  disturbance.  Respiratory: Negative for cough, chest tightness, shortness of breath and wheezing.   Cardiovascular: Negative for chest pain, palpitations and leg swelling.  Gastrointestinal: Negative for abdominal pain, constipation, diarrhea, nausea and vomiting.  Endocrine: Negative.   Genitourinary: Negative.  Negative for difficulty urinating.  Musculoskeletal: Positive for arthralgias and myalgias. Negative for neck pain and neck stiffness.  Skin: Negative.  Negative for color change and rash.  Allergic/Immunologic: Negative.   Neurological: Negative.  Negative for dizziness, weakness, numbness and headaches.  Hematological: Negative for adenopathy. Does not bruise/bleed easily.  Psychiatric/Behavioral: Positive for dysphoric mood and sleep disturbance. Negative for agitation, behavioral problems, confusion, decreased concentration, self-injury and suicidal ideas. The patient is not nervous/anxious.     Objective:  BP 130/84 (BP Location: Left Arm, Patient Position: Sitting, Cuff Size: Normal)   Pulse 65   Temp 98.1 F (36.7 C) (Oral)   Ht 5\' 2"  (1.575 m)   Wt 162 lb 4 oz (73.6 kg)   SpO2 99%   BMI 29.68 kg/m   BP Readings from Last 3 Encounters:  12/11/16 130/84  07/18/16 139/71  03/21/16 (!) 156/84    Wt Readings from Last 3 Encounters:  12/11/16 162 lb 4 oz (73.6 kg)  03/21/16 155 lb 12.8 oz (70.7 kg)  03/13/16 155 lb (70.3 kg)    Physical Exam  Constitutional: She is oriented to person, place, and time. No distress.  HENT:  Mouth/Throat: Oropharynx is clear and moist. No oropharyngeal exudate.  Eyes: Conjunctivae are normal. Right eye exhibits no discharge. Left eye exhibits no discharge. No scleral icterus.  Neck: Normal range of motion. Neck supple. No JVD present. No thyromegaly present.  Cardiovascular: Normal rate, regular rhythm and intact distal pulses.  Exam reveals no gallop.   No murmur heard. Pulmonary/Chest: Effort normal and breath sounds normal. No  respiratory distress. She has no wheezes. She has no rales. She exhibits no tenderness.  Abdominal: Soft. Bowel sounds are normal. She exhibits no distension and no mass. There is no tenderness. There is no rebound and no guarding.  Musculoskeletal: Normal range of motion. She exhibits no edema, tenderness or deformity.  Neurological: She is alert and oriented to person, place, and time.  Skin: Skin is warm and dry. No rash noted. She is not diaphoretic. No erythema. No pallor.  Psychiatric: Her behavior is normal. Thought content normal. Her mood appears not anxious. Her affect is not angry. Her speech is not rapid and/or pressured, not delayed and not tangential. She is not agitated, not aggressive, not slowed and not withdrawn. She exhibits a depressed mood. She expresses no homicidal and no suicidal ideation. She expresses no suicidal plans and no homicidal plans. She is attentive.  Vitals reviewed.   Lab Results  Component Value Date   WBC 6.9 12/11/2016   HGB 13.3 12/11/2016   HCT 39.6 12/11/2016   PLT 287.0 12/11/2016   GLUCOSE 90 12/11/2016   CHOL 131 12/11/2016   TRIG 167.0 (H) 12/11/2016   HDL 57.30 12/11/2016   LDLDIRECT 99.0 08/04/2015   LDLCALC 40 12/11/2016   ALT 22 12/11/2016   AST 20 12/11/2016   NA 141 12/11/2016   K 3.6 12/11/2016   CL 105 12/11/2016   CREATININE 0.93 12/11/2016   BUN 14 12/11/2016   CO2 28 12/11/2016   TSH 3.98 12/11/2016   HGBA1C 5.5 03/09/2016    Mr Brain Wo Contrast  Result Date: 08/15/2015 CLINICAL DATA:  Headache and paresthesias. EXAM: MRI HEAD WITHOUT CONTRAST TECHNIQUE: Multiplanar, multiecho pulse sequences of the brain and surrounding structures were obtained without intravenous contrast. COMPARISON:  None. FINDINGS: Calvarium and upper cervical spine: Prominent cervical facet arthropathy. No focal marrow lesion. Orbits: Negative. Sinuses and Mastoids: Clear. Brain: No specific explanation for headache. No acute remote infarct,  hemorrhage, hydrocephalus, or mass lesion. No evidence of large vessel occlusion. Mild for age small foci of cerebral white matter signal abnormality with frontal predominance. This is likely related to chronic microvascular ischemia, especially given patient's history of hypertension, but complicated migraines would have the same appearance. No specific demyelinating pattern to explain paresthesias. IMPRESSION: 1. No specific or reversible abnormality. 2. Mild white matter disease, likely secondary to patient's history of hypertension and migraine. Electronically Signed   By: Monte Fantasia M.D.   On: 08/15/2015 19:49    Assessment & Plan:   Vicki Martin was seen today for hypertension and depression.  Diagnoses and all orders for this visit:  Estrogen deficiency -     DG Bone Density; Future  Essential hypertension- her blood pressure is adequately well controlled. She has borderline low potassium so Avastin to start potassium replacement therapy. -     Comprehensive metabolic panel; Future -     CBC with Differential/Platelet; Future -     Thyroid Panel With TSH; Future -     potassium chloride SA (K-DUR,KLOR-CON) 20 MEQ tablet; Take 1 tablet (20 mEq total) by mouth 2 (two)  times daily.  Pure hypercholesterolemia- she has achieved her LDL goal is doing well on the statin. -     Lipid panel; Future -     Thyroid Panel With TSH; Future  Hypertriglyceridemia- improvement noted, she will continue to work on her lifestyle modifications. -     Lipid panel; Future  Routine general medical examination at a health care facility  Restless legs syndrome (RLS) -     pregabalin (LYRICA) 75 MG capsule; Take 1 capsule (75 mg total) by mouth at bedtime.  Depression with somatization -     DULoxetine (CYMBALTA) 30 MG capsule; Take 1 capsule (30 mg total) by mouth daily.  Need for vaccination for Strep pneumoniae -     Pneumococcal conjugate vaccine 13-valent   I have discontinued Ms. Jester's  gabapentin, multivitamin, meloxicam, and PARoxetine. I am also having her start on DULoxetine, pregabalin, and potassium chloride SA. Additionally, I am having her maintain her aspirin, carvedilol, celecoxib, atorvastatin, omeprazole, and losartan-hydrochlorothiazide.  Meds ordered this encounter  Medications  . DULoxetine (CYMBALTA) 30 MG capsule    Sig: Take 1 capsule (30 mg total) by mouth daily.    Dispense:  30 capsule    Refill:  3  . pregabalin (LYRICA) 75 MG capsule    Sig: Take 1 capsule (75 mg total) by mouth at bedtime.    Dispense:  42 capsule    Refill:  0  . potassium chloride SA (K-DUR,KLOR-CON) 20 MEQ tablet    Sig: Take 1 tablet (20 mEq total) by mouth 2 (two) times daily.    Dispense:  60 tablet    Refill:  3     Follow-up: Return in about 3 weeks (around 01/01/2017).  Scarlette Calico, MD

## 2016-12-11 NOTE — Patient Instructions (Signed)

## 2016-12-12 ENCOUNTER — Ambulatory Visit (INDEPENDENT_AMBULATORY_CARE_PROVIDER_SITE_OTHER)
Admission: RE | Admit: 2016-12-12 | Discharge: 2016-12-12 | Disposition: A | Discharge status: Home / Self Care | Payer: Medicare Other | Source: Ambulatory Visit | Attending: Internal Medicine | Admitting: Internal Medicine

## 2016-12-12 ENCOUNTER — Encounter: Payer: Self-pay | Admitting: Internal Medicine

## 2016-12-12 DIAGNOSIS — E2839 Other primary ovarian failure: Secondary | ICD-10-CM

## 2016-12-12 LAB — THYROID PANEL WITH TSH
FREE THYROXINE INDEX: 2.5 (ref 1.4–3.8)
T3 Uptake: 26 % (ref 22–35)
T4 TOTAL: 9.5 ug/dL (ref 4.5–12.0)
TSH: 3.98 m[IU]/L

## 2016-12-12 MED ORDER — POTASSIUM CHLORIDE CRYS ER 20 MEQ PO TBCR: 20.0000 meq | EXTENDED_RELEASE_TABLET | Freq: Two times a day (BID) | ORAL | 3 refills | Status: DC

## 2016-12-14 ENCOUNTER — Encounter: Payer: Self-pay | Admitting: Internal Medicine

## 2016-12-14 LAB — HM DEXA SCAN

## 2016-12-15 ENCOUNTER — Other Ambulatory Visit: Payer: Self-pay

## 2017-01-01 ENCOUNTER — Encounter: Payer: Self-pay | Admitting: Internal Medicine

## 2017-01-01 ENCOUNTER — Ambulatory Visit (INDEPENDENT_AMBULATORY_CARE_PROVIDER_SITE_OTHER): Payer: Medicare Other | Admitting: Internal Medicine

## 2017-01-01 VITALS — BP 130/82 | HR 82 | Temp 98.5°F | Resp 16 | Ht 62.0 in | Wt 160.0 lb

## 2017-01-01 DIAGNOSIS — Z0000 Encounter for general adult medical examination without abnormal findings: Secondary | ICD-10-CM

## 2017-01-01 DIAGNOSIS — G2581 Restless legs syndrome: Secondary | ICD-10-CM

## 2017-01-01 DIAGNOSIS — I10 Essential (primary) hypertension: Secondary | ICD-10-CM

## 2017-01-01 DIAGNOSIS — F329 Major depressive disorder, single episode, unspecified: Secondary | ICD-10-CM | POA: Diagnosis not present

## 2017-01-01 DIAGNOSIS — F450 Somatization disorder: Secondary | ICD-10-CM

## 2017-01-01 MED ORDER — PREGABALIN 75 MG PO CAPS: 150.0000 mg | ORAL_CAPSULE | Freq: Every day | ORAL | 1 refills | Status: DC

## 2017-01-01 MED ORDER — DULOXETINE HCL 60 MG PO CPEP: 60.0000 mg | ORAL_CAPSULE | Freq: Every day | ORAL | 1 refills | Status: DC

## 2017-01-01 NOTE — Progress Notes (Addendum)
Subjective:   Vicki Martin is a 66 y.o. female who presents for Medicare Annual (Subsequent) preventive examination.  Review of Systems:  No ROS.  Medicare Wellness Visit. Additional risk factors are reflected in the social history.  Cardiac Risk Factors include: advanced age (>75men, >57 women);dyslipidemia;hypertension Sleep patterns: has frequent nighttime awakenings, has daytime sleepiness, gets up 1 times nightly to void and sleeps 4-5 hours nightly. Patient reports insomnia issues, discussed recommended sleep tips and stress reduction tips.  Home Safety/Smoke Alarms: Feels safe in home. Smoke alarms in place.  Living environment; residence and Firearm Safety: 1-story house/ trailer, no firearms. Lives with husband, no needs for DME, good support system Seat Belt Safety/Bike Helmet: Wears seat belt.   Counseling:   Eye Exam-  Dental- appointment every 6 months [place]  Female:   Pap- N/A       Mammo- Last 01/19/16,  BI-RADS category 1: negative       Dexa scan- Last 12/14/16, normal       CCS- Last 09/20/15, recall 10 years     Objective:     Vitals: BP 130/82 (BP Location: Left Arm, Patient Position: Sitting, Cuff Size: Normal)   Pulse 82   Temp 98.5 F (36.9 C) (Oral)   Resp 16   Ht 5\' 2"  (1.575 m)   Wt 160 lb (72.6 kg)   SpO2 97%   BMI 29.26 kg/m   Body mass index is 29.26 kg/m.   Tobacco History  Smoking Status  . Never Smoker  Smokeless Tobacco  . Never Used     Counseling given: Not Answered   Past Medical History:  Diagnosis Date  . Anxiety   . Arthritis   . Depression   . Family history of pancreatic cancer   . Family history of stomach cancer   . GERD (gastroesophageal reflux disease)   . Heart murmur   . Hyperlipidemia    no medications  . Hypertension   . Migraines   . Sleep apnea    pt declines CPAP   Past Surgical History:  Procedure Laterality Date  . ABDOMINAL HYSTERECTOMY     reports complet but with ovaries retained  .  CHOLECYSTECTOMY    . removed hip bone to replace vertebrae     Family History  Problem Relation Age of Onset  . Rheum arthritis Mother   . Stomach cancer Mother 72  . Pancreatic cancer Mother 95  . Stomach cancer Maternal Aunt   . Pancreatic cancer Maternal Aunt   . Heart disease Father   . Hypertension Sister   . Lung cancer Brother        deceased  . Early death Brother   . Liver cancer Brother   . Hypertension Brother   . COPD Brother   . Cancer Maternal Uncle        NOS  . Cancer Maternal Aunt        NOS  . Lung cancer Other   . Ovarian cysts Daughter   . Diabetes Neg Hx   . Alcohol abuse Neg Hx   . Stroke Neg Hx   . Colon cancer Neg Hx   . Esophageal cancer Neg Hx   . Rectal cancer Neg Hx    History  Sexual Activity  . Sexual activity: Not Currently    Outpatient Encounter Prescriptions as of 01/01/2017  Medication Sig  . aspirin 81 MG tablet Take 81 mg by mouth daily.  Marland Kitchen atorvastatin (LIPITOR) 40 MG tablet TAKE ONE  TABLET BY MOUTH ONCE DAILY  . carvedilol (COREG) 6.25 MG tablet Take 1 tablet (6.25 mg total) by mouth 2 (two) times daily.  . celecoxib (CELEBREX) 200 MG capsule Take 1 capsule (200 mg total) by mouth 2 (two) times daily.  Marland Kitchen losartan-hydrochlorothiazide (HYZAAR) 100-12.5 MG tablet TAKE 1 TABLET BY MOUTH ONCE DAILY  . omeprazole (PRILOSEC) 40 MG capsule TAKE ONE CAPSULE BY MOUTH ONCE DAILY  . potassium chloride SA (K-DUR,KLOR-CON) 20 MEQ tablet Take 1 tablet (20 mEq total) by mouth 2 (two) times daily.  . pregabalin (LYRICA) 75 MG capsule Take 2 capsules (150 mg total) by mouth at bedtime.  . [DISCONTINUED] DULoxetine (CYMBALTA) 30 MG capsule Take 1 capsule (30 mg total) by mouth daily.  . [DISCONTINUED] pregabalin (LYRICA) 75 MG capsule Take 1 capsule (75 mg total) by mouth at bedtime.  . DULoxetine (CYMBALTA) 60 MG capsule Take 1 capsule (60 mg total) by mouth daily.   No facility-administered encounter medications on file as of 01/01/2017.      Activities of Daily Living In your present state of health, do you have any difficulty performing the following activities: 01/01/2017  Hearing? N  Vision? N  Difficulty concentrating or making decisions? N  Walking or climbing stairs? N  Dressing or bathing? N  Doing errands, shopping? N  Preparing Food and eating ? N  Using the Toilet? N  In the past six months, have you accidently leaked urine? N  Do you have problems with loss of bowel control? N  Managing your Medications? N  Managing your Finances? N  Housekeeping or managing your Housekeeping? N  Some recent data might be hidden    Patient Care Team: Janith Lima, MD as PCP - General (Internal Medicine)    Assessment:    Physical assessment deferred to PCP.  Exercise Activities and Dietary recommendations Current Exercise Habits: The patient does not participate in regular exercise at present (patient states she is planning to start to walk in her neighborhood), Exercise limited by: None identified  Diet (meal preparation, eat out, water intake, caffeinated beverages, dairy products, fruits and vegetables): in general, a "healthy" diet  , well balanced, low fat/ cholesterol, low salt  Encouraged patient to increase daily water intake.   Goals    . Enjoy life and family          Continue to see my grandchildren as often as possible and work in my garden.      Fall Risk Fall Risk  01/01/2017 08/08/2015  Falls in the past year? No No   Depression Screen PHQ 2/9 Scores 01/01/2017 12/11/2016 08/08/2015  PHQ - 2 Score 2 4 0  PHQ- 9 Score 8 18 -     Cognitive Function       Ad8 score reviewed for issues:  Issues making decisions: no  Less interest in hobbies / activities: no  Repeats questions, stories (family complaining): no  Trouble using ordinary gadgets (microwave, computer, phone):no  Forgets the month or year: no  Mismanaging finances: no  Remembering appts: no  Daily problems with thinking  and/or memory: no Ad8 score is= 0    Immunization History  Administered Date(s) Administered  . Influenza, High Dose Seasonal PF 03/09/2016  . Influenza,inj,Quad PF,36+ Mos 08/04/2015  . Pneumococcal Conjugate-13 12/11/2016  . Td 07/27/2001  . Tdap 08/04/2015   Screening Tests Health Maintenance  Topic Date Due  . Hepatitis C Screening  [birth year]  . MAMMOGRAM  01/18/2013  . INFLUENZA VACCINE  12/27/2016  . PNA vac Low Risk Adult (2 of 2 - PPSV23) 12/11/2017  . TETANUS/TDAP  08/03/2025  . COLONOSCOPY  09/19/2025  . DEXA SCAN  Completed      Plan:    Continue doing brain stimulating activities (puzzles, reading, adult coloring books, staying active) to keep memory sharp.   Continue to eat heart healthy diet (full of fruits, vegetables, whole grains, lean protein, water--limit salt, fat, and sugar intake) and increase physical activity as tolerated.  I have personally reviewed and noted the following in the patient's chart:   . Medical and social history . Use of alcohol, tobacco or illicit drugs  . Current medications and supplements . Functional ability and status . Nutritional status . Physical activity . Advanced directives . List of other physicians . Vitals . Screenings to include cognitive, depression, and falls . Referrals and appointments  In addition, I have reviewed and discussed with patient certain preventive protocols, quality metrics, and best practice recommendations. A written personalized care plan for preventive services as well as general preventive health recommendations were provided to patient.     Michiel Cowboy, RN  01/01/2017   Medical screening examination/treatment/procedure(s) were performed by non-physician practitioner and as supervising physician I was immediately available for consultation/collaboration. I agree with above. Scarlette Calico, MD

## 2017-01-01 NOTE — Progress Notes (Signed)
Pre visit review using our clinic review tool, if applicable. No additional management support is needed unless otherwise documented below in the visit note. 

## 2017-01-01 NOTE — Patient Instructions (Addendum)
Continue doing brain stimulating activities (puzzles, reading, adult coloring books, staying active) to keep memory sharp.   Continue to eat heart healthy diet (full of fruits, vegetables, whole grains, lean protein, water--limit salt, fat, and sugar intake) and increase physical activity as tolerated.   Vicki Martin , Thank you for taking time to come for your Medicare Wellness Visit. I appreciate your ongoing commitment to your health goals. Please review the following plan we discussed and let me know if I can assist you in the future.   These are the goals we discussed: Goals    . Enjoy life and family          Continue to see my grandchildren as often as possible and work in my garden.       This is a list of the screening recommended for you and due dates:  Health Maintenance  Topic Date Due  .  Hepatitis C: One time screening is recommended by Center for Disease Control  (CDC) for  adults born from [birth year] through 1965.   [birth year]  . Mammogram  01/18/2013  . Flu Shot  12/27/2016  . Pneumonia vaccines (2 of 2 - PPSV23) 12/11/2017  . Tetanus Vaccine  08/03/2025  . Colon Cancer Screening  09/19/2025  . DEXA scan (bone density measurement)  Completed      Major Depressive Disorder, Adult Major depressive disorder (MDD) is a mental health condition. It may also be called clinical depression or unipolar depression. MDD usually causes feelings of sadness, hopelessness, or helplessness. MDD can also cause physical symptoms. It can interfere with work, school, relationships, and other everyday activities. MDD may be mild, moderate, or severe. It may occur once (single episode major depressive disorder) or it may occur multiple times (recurrent major depressive disorder). What are the causes? The exact cause of this condition is not known. MDD is most likely caused by a combination of things, which may include:  Genetic factors. These are traits that are passed along from parent to  child.  Individual factors. Your personality, your behavior, and the way you handle your thoughts and feelings may contribute to MDD. This includes personality traits and behaviors learned from others.  Physical factors, such as: ? Differences in the part of your brain that controls emotion. This part of your brain may be different than it is in people who do not have MDD. ? Long-term (chronic) medical or psychiatric illnesses.  Social factors. Traumatic experiences or major life changes may play a role in the development of MDD.  What increases the risk? This condition is more likely to develop in women. The following factors may also make you more likely to develop MDD:  A family history of depression.  Troubled family relationships.  Abnormally low levels of certain brain chemicals.  Traumatic events in childhood, especially abuse or the loss of a parent.  Being under a lot of stress, or long-term stress, especially from upsetting life experiences or losses.  A history of: ? Chronic physical illness. ? Other mental health disorders. ? Substance abuse.  Poor living conditions.  Experiencing social exclusion or discrimination on a regular basis.  What are the signs or symptoms? The main symptoms of MDD typically include:  Constant depressed or irritable mood.  Loss of interest in things and activities.  MDD symptoms may also include:  Sleeping or eating too much or too little.  Unexplained weight change.  Fatigue or low energy.  Feelings of worthlessness or guilt.  Difficulty thinking  clearly or making decisions.  Thoughts of suicide or of harming others.  Physical agitation or weakness.  Isolation.  Severe cases of MDD may also occur with other symptoms, such as:  Delusions or hallucinations, in which you imagine things that are not real (psychotic depression).  Low-level depression that lasts at least a year (chronic depression or persistent depressive  disorder).  Extreme sadness and hopelessness (melancholic depression).  Trouble speaking and moving (catatonic depression).  How is this diagnosed? This condition may be diagnosed based on:  Your symptoms.  Your medical history, including your mental health history. This may involve tests to evaluate your mental health. You may be asked questions about your lifestyle, including any drug and alcohol use, and how long you have had symptoms of MDD.  A physical exam.  Blood tests to rule out other conditions.  You must have a depressed mood and at least four other MDD symptoms most of the day, nearly every day in the same 2-week timeframe before your health care provider can confirm a diagnosis of MDD. How is this treated? This condition is usually treated by mental health professionals, such as psychologists, psychiatrists, and clinical social workers. You may need more than one type of treatment. Treatment may include:  Psychotherapy. This is also called talk therapy or counseling. Types of psychotherapy include: ? Cognitive behavioral therapy (CBT). This type of therapy teaches you to recognize unhealthy feelings, thoughts, and behaviors, and replace them with positive thoughts and actions. ? Interpersonal therapy (IPT). This helps you to improve the way you relate to and communicate with others. ? Family therapy. This treatment includes members of your family.  Medicine to treat anxiety and depression, or to help you control certain emotions and behaviors.  Lifestyle changes, such as: ? Limiting alcohol and drug use. ? Exercising regularly. ? Getting plenty of sleep. ? Making healthy eating choices. ? Spending more time outdoors.  Treatments involving stimulation of the brain can be used in situations with extremely severe symptoms, or when medicine or other therapies do not work over time. These treatments include electroconvulsive therapy, transcranial magnetic stimulation, and  vagal nerve stimulation. Follow these instructions at home: Activity  Return to your normal activities as told by your health care provider.  Exercise regularly and spend time outdoors as told by your health care provider. General instructions  Take over-the-counter and prescription medicines only as told by your health care provider.  Do not drink alcohol. If you drink alcohol, limit your alcohol intake to no more than 1 drink a day for nonpregnant women and 2 drinks a day for men. One drink equals 12 oz of beer, 5 oz of wine, or 1 oz of hard liquor. Alcohol can affect any antidepressant medicines you are taking. Talk to your health care provider about your alcohol use.  Eat a healthy diet and get plenty of sleep.  Find activities that you enjoy doing, and make time to do them.  Consider joining a support group. Your health care provider may be able to recommend a support group.  Keep all follow-up visits as told by your health care provider. This is important. Where to find more information: [employer] on Mental Illness  www.nami.org  U.[place]. National Institute of Mental Health  https://carter.com/  National Suicide Prevention Lifeline  1-800-273-TALK (763) 821-9409). This is free, 24-hour help.  Contact a health care provider if:  Your symptoms get worse.  You develop new symptoms. Get help right away if:  You self-harm.  You have serious thoughts about hurting yourself or others.  You see, hear, taste, smell, or feel things that are not present (hallucinate). This information is not intended to replace advice given to you by your health care provider. Make sure you discuss any questions you have with your health care provider. Document Released: 09/09/2012 Document Revised: 01/20/2016 Document Reviewed: 11/24/2015 Elsevier Interactive Patient Education  2017 [employer].

## 2017-01-01 NOTE — Progress Notes (Signed)
Subjective:  Patient ID: Vicki Martin, female    DOB: 01-13-1951  Age: 66 y.o. MRN: [id number]  CC: Hypertension and Medicare Wellness   HPI Vicki Martin presents for f/up - She tells me that her sleep symptoms have improved. She continues to complain of jerking and excessive movements during the night with insomnia. She wants to try a higher dose of Lyrica. She also tells me her mood is starting to improve and she wants to try a higher dose of duloxetine. She offers no new complaints today.  Outpatient Medications Prior to Visit  Medication Sig Dispense Refill  . aspirin 81 MG tablet Take 81 mg by mouth daily.    Marland Kitchen atorvastatin (LIPITOR) 40 MG tablet TAKE ONE TABLET BY MOUTH ONCE DAILY 90 tablet 3  . carvedilol (COREG) 6.25 MG tablet Take 1 tablet (6.25 mg total) by mouth 2 (two) times daily. 60 tablet 11  . celecoxib (CELEBREX) 200 MG capsule Take 1 capsule (200 mg total) by mouth 2 (two) times daily. 60 capsule 1  . losartan-hydrochlorothiazide (HYZAAR) 100-12.5 MG tablet TAKE 1 TABLET BY MOUTH ONCE DAILY 90 tablet 1  . omeprazole (PRILOSEC) 40 MG capsule TAKE ONE CAPSULE BY MOUTH ONCE DAILY 90 capsule 1  . potassium chloride SA (K-DUR,KLOR-CON) 20 MEQ tablet Take 1 tablet (20 mEq total) by mouth 2 (two) times daily. 60 tablet 3  . DULoxetine (CYMBALTA) 30 MG capsule Take 1 capsule (30 mg total) by mouth daily. 30 capsule 3  . pregabalin (LYRICA) 75 MG capsule Take 1 capsule (75 mg total) by mouth at bedtime. 42 capsule 0   No facility-administered medications prior to visit.     ROS Review of Systems  Constitutional: Negative.  Negative for appetite change, diaphoresis, fatigue and unexpected weight change.  HENT: Negative.  Negative for trouble swallowing.   Eyes: Negative for visual disturbance.  Respiratory: Negative for cough, chest tightness, shortness of breath and wheezing.   Cardiovascular: Negative for chest pain, palpitations and leg swelling.  Gastrointestinal:  Negative for abdominal pain, constipation, diarrhea, nausea and vomiting.  Endocrine: Negative.   Genitourinary: Negative.   Musculoskeletal: Negative.   Skin: Negative.   Allergic/Immunologic: Negative.   Neurological: Negative.  Negative for dizziness, weakness and numbness.  Hematological: Negative for adenopathy. Does not bruise/bleed easily.  Psychiatric/Behavioral: Positive for dysphoric mood and sleep disturbance. Negative for confusion, self-injury and suicidal ideas. The patient is not nervous/anxious.     Objective:  BP 130/82 (BP Location: Left Arm, Patient Position: Sitting, Cuff Size: Normal)   Pulse 82   Temp 98.5 F (36.9 C) (Oral)   Resp 16   Ht 5\' 2"  (1.575 m)   Wt 160 lb (72.6 kg)   SpO2 97%   BMI 29.26 kg/m   BP Readings from Last 3 Encounters:  01/01/17 130/82  12/11/16 130/84  07/18/16 139/71    Wt Readings from Last 3 Encounters:  01/01/17 160 lb (72.6 kg)  12/11/16 162 lb 4 oz (73.6 kg)  03/21/16 155 lb 12.8 oz (70.7 kg)    Physical Exam  Constitutional: She is oriented to person, place, and time. No distress.  HENT:  Mouth/Throat: Oropharynx is clear and moist. No oropharyngeal exudate.  Eyes: Conjunctivae are normal. Right eye exhibits no discharge. Left eye exhibits no discharge. No scleral icterus.  Neck: Normal range of motion. Neck supple. No JVD present. No thyromegaly present.  Cardiovascular: Normal rate, regular rhythm and intact distal pulses.  Exam reveals no gallop.  No murmur heard. Pulmonary/Chest: Effort normal and breath sounds normal. No respiratory distress. She has no wheezes. She has no rales. She exhibits no tenderness.  Abdominal: Soft. Bowel sounds are normal. She exhibits no distension and no mass. There is no tenderness. There is no rebound and no guarding.  Musculoskeletal: Normal range of motion. She exhibits no edema, tenderness or deformity.  Lymphadenopathy:    She has no cervical adenopathy.  Neurological: She is  alert and oriented to person, place, and time.  Skin: Skin is warm and dry. No rash noted. She is not diaphoretic. No erythema. No pallor.  Psychiatric: She has a normal mood and affect. Her behavior is normal. Judgment and thought content normal.  Vitals reviewed.   Lab Results  Component Value Date   WBC 6.9 12/11/2016   HGB 13.3 12/11/2016   HCT 39.6 12/11/2016   PLT 287.0 12/11/2016   GLUCOSE 90 12/11/2016   CHOL 131 12/11/2016   TRIG 167.0 (H) 12/11/2016   HDL 57.30 12/11/2016   LDLDIRECT 99.0 08/04/2015   LDLCALC 40 12/11/2016   ALT 22 12/11/2016   AST 20 12/11/2016   NA 141 12/11/2016   K 3.6 12/11/2016   CL 105 12/11/2016   CREATININE 0.93 12/11/2016   BUN 14 12/11/2016   CO2 28 12/11/2016   TSH 3.98 12/11/2016   HGBA1C 5.5 03/09/2016    Dg Bone Density  Result Date: 12/14/2016 Date of study: 12/12/2016 Exam: DUAL X-RAY ABSORPTIOMETRY (DXA) FOR BONE MINERAL DENSITY (BMD) Instrument: [employer] Provider: PCP Indication: screening for osteoporosis Comparison: none (please note that it is not possible to compare data from different instruments) Clinical data: Pt is a 66 y.o. female without previous fractures. On calcium and vitamin D. Results:  Lumbar spine (L1-L4) Femoral neck (FN) T-score -0.1 RFN:-0.6 LFN:-0.3 Assessment: the BMD is normal according to the [facility] classification for osteoporosis (see below). Fracture risk: low FRAX score: not calculated due to normal BMD Comments: the technical quality of the study is good Recommend optimizing calcium (1200 mg/day) and vitamin D (800 IU/day) intake. No pharmacological treatment is indicated. Followup: Repeat BMD is appropriate after 2 years. WHO criteria for diagnosis of osteoporosis in postmenopausal women and in men 32 y/o or older: - normal: T-score -1.0 to + 1.0 - osteopenia/low bone density: T-score between -2.5 and -1.0 - osteoporosis: T-score below -2.5 - severe osteoporosis: T-score below -2.5 with  history of fragility fracture Note: although not part of the WHO classification, the presence of a fragility fracture, regardless of the T-score, should be considered diagnostic of osteoporosis, provided other causes for the fracture have been excluded. Philemon Kingdom, MD [facility]    Assessment & Plan:   Vicki Martin was seen today for hypertension and medicare wellness.  Diagnoses and all orders for this visit:  Depression with somatization- I think she would benefit from a higher dose of duloxetine. -     DULoxetine (CYMBALTA) 60 MG capsule; Take 1 capsule (60 mg total) by mouth daily.  Restless legs syndrome (RLS)- I think she would benefit from a higher dose of Lyrica. -     pregabalin (LYRICA) 75 MG capsule; Take 2 capsules (150 mg total) by mouth at bedtime.  Encounter for Medicare annual wellness exam  Essential hypertension- her blood pressure is adequately well controlled.   I have discontinued Vicki Martin's DULoxetine. I have also changed her pregabalin. Additionally, I am having her start on DULoxetine. Lastly, I am having her maintain her aspirin, carvedilol,  celecoxib, atorvastatin, omeprazole, losartan-hydrochlorothiazide, and potassium chloride SA.  Meds ordered this encounter  Medications  . DULoxetine (CYMBALTA) 60 MG capsule    Sig: Take 1 capsule (60 mg total) by mouth daily.    Dispense:  90 capsule    Refill:  1  . pregabalin (LYRICA) 75 MG capsule    Sig: Take 2 capsules (150 mg total) by mouth at bedtime.    Dispense:  180 capsule    Refill:  1     Follow-up: Return in about 3 months (around 04/03/2017).  Scarlette Calico, MD

## 2017-01-02 ENCOUNTER — Other Ambulatory Visit: Payer: Self-pay | Admitting: Internal Medicine

## 2017-01-02 ENCOUNTER — Encounter: Payer: Self-pay | Admitting: Internal Medicine

## 2017-01-02 DIAGNOSIS — G2581 Restless legs syndrome: Secondary | ICD-10-CM

## 2017-01-02 MED ORDER — GABAPENTIN 300 MG PO CAPS: 300.0000 mg | ORAL_CAPSULE | Freq: Every day | ORAL | 1 refills | Status: DC

## 2017-02-09 ENCOUNTER — Encounter: Payer: Self-pay | Admitting: Cardiovascular Disease

## 2017-02-22 ENCOUNTER — Ambulatory Visit (INDEPENDENT_AMBULATORY_CARE_PROVIDER_SITE_OTHER): Payer: Medicare Other | Admitting: Cardiovascular Disease

## 2017-02-22 ENCOUNTER — Encounter: Payer: Self-pay | Admitting: Cardiovascular Disease

## 2017-02-22 VITALS — BP 128/72 | HR 86 | Ht 62.0 in | Wt 159.0 lb

## 2017-02-22 DIAGNOSIS — R9439 Abnormal result of other cardiovascular function study: Secondary | ICD-10-CM | POA: Diagnosis not present

## 2017-02-22 DIAGNOSIS — R079 Chest pain, unspecified: Secondary | ICD-10-CM | POA: Diagnosis not present

## 2017-02-22 DIAGNOSIS — I447 Left bundle-branch block, unspecified: Secondary | ICD-10-CM

## 2017-02-22 NOTE — Progress Notes (Signed)
Cardiology Office Note   Date:  02/22/2017   ID:  Vicki Martin, DOB 10/06/1950, MRN [id number]  PCP:  Janith Lima, MD  Cardiologist:   Mertie Moores, MD   No chief complaint on file.  Problem List 1. Essential HTN 2. Murmur 3. Obstructive sleep apnea    -  Does not have  CPAP ( was not mentioned by her doctor )  4. Hyperlipidemia    Previous notes   Vicki Martin is a 66 y.o. female who presents for evaluation of heart murmur Also has had squeezing her chest for the past 3 months . Tightness  Not exertional , not related to twising of her torso .  Not related to exertion Lasts few seconds.   is associated with dyspnea.   Able to bring in the groceries without any problems  Gets SOB and dizzy when she walks too far when shopping   Has very high triglyceride levels.  Was not able to afford the medication ..   Oct. 24, 2017 Doing ok from a cardiac standpoint Has some occasional left sided chest pain .   Is not sleeping well.   She had a Myoview study in April, 2017 which revealed a apical defect that was most likely artifact. She has normal left ventricle systolic function. There is no evidence of ischemia. The study was intermediate risk.  Sept. 27, 2018:   Vicki Martin is seen today  BP looks good Continues to have CP  Pain starts in her back and  radiates through to her chest  Gets throat tightness.   Associated with dyspnea,  Goes to her throat. Comes on with rest or exertion.   Has awakened her at night.   Does not get any regular exercise  Now has some knee issues and is not able to walk muc    Past Medical History:  Diagnosis Date  . Anxiety   . Arthritis   . Depression   . Family history of pancreatic cancer   . Family history of stomach cancer   . GERD (gastroesophageal reflux disease)   . Heart murmur   . Hyperlipidemia    no medications  . Hypertension   . Migraines   . Sleep apnea    pt declines CPAP    Past Surgical History:    Procedure Laterality Date  . ABDOMINAL HYSTERECTOMY     reports complet but with ovaries retained  . CHOLECYSTECTOMY    . 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 aspirin 81 MG tablet Take 81 mg by mouth daily.    Marland Kitchen atorvastatin (LIPITOR) 40 MG tablet TAKE ONE TABLET BY MOUTH ONCE DAILY 90 tablet 3  . carvedilol (COREG) 6.25 MG tablet Take 1 tablet (6.25 mg total) by mouth 2 (two) times daily. 60 tablet 11  . DULoxetine (CYMBALTA) 60 MG capsule Take 1 capsule (60 mg total) by mouth daily. 90 capsule 1  . gabapentin (NEURONTIN) 300 MG capsule Take 1 capsule (300 mg total) by mouth at bedtime. 90 capsule 1  . losartan-hydrochlorothiazide (HYZAAR) 100-12.5 MG tablet TAKE 1 TABLET BY MOUTH ONCE DAILY 90 tablet 1  . omeprazole (PRILOSEC) 40 MG capsule TAKE ONE CAPSULE BY MOUTH ONCE DAILY 90 capsule 1  . PARoxetine (PAXIL) 10 MG tablet Take 10 mg by mouth daily.    . potassium chloride SA (K-DUR,KLOR-CON) 20 MEQ tablet Take 1 tablet (20 mEq total)  by mouth 2 (two) times daily. 60 tablet 3   No current facility-administered medications for this visit.     Allergies:   Codeine and Penicillins    Social History:  The patient  reports that she has never smoked. She has never used smokeless tobacco. She reports that she does not drink alcohol or use drugs.   Family History:  The patient's family history includes COPD in her brother; Cancer in her maternal aunt and maternal uncle; Early death in her brother; Heart disease in her father; Hypertension in her brother and sister; Liver cancer in her brother; Lung cancer in her brother and other; Ovarian cysts in her daughter; Pancreatic cancer in her maternal aunt; Pancreatic cancer (age of onset: 32) in her mother; Rheum arthritis in her mother; Stomach cancer in her maternal aunt; Stomach cancer (age of onset: 59) in her mother.    ROS:  Please see the history of present  illness.    Review of Systems: Constitutional:  denies fever, chills, diaphoresis, appetite change and fatigue.  HEENT: denies photophobia, eye pain, redness, hearing loss, ear pain, congestion, sore throat, rhinorrhea, sneezing, neck pain, neck stiffness and tinnitus.  Respiratory: denies SOB, DOE, cough, chest tightness, and wheezing.  Cardiovascular: admits to chest pain,    Gastrointestinal: denies nausea, vomiting, abdominal pain, diarrhea, constipation, blood in stool.  Genitourinary: denies dysuria, urgency, frequency, hematuria, flank pain and difficulty urinating.  Musculoskeletal: denies  myalgias, back pain, joint swelling, arthralgias and gait problem.   Skin: denies pallor, rash and wound.  Neurological: denies dizziness, seizures, syncope, weakness, light-headedness, numbness and headaches.   Hematological: denies adenopathy, easy bruising, personal or family bleeding history.  Psychiatric/ Behavioral: denies suicidal ideation, mood changes, confusion, nervousness, sleep disturbance and agitation.       All other systems are reviewed and negative.    PHYSICAL EXAM:  Physical Exam: Blood pressure 128/72, pulse 86, height 5\' 2"  (1.575 m), weight 159 lb (72.1 kg), SpO2 96 %.  GEN:  Well nourished, well developed in no acute distress HEENT: Normal NECK: No JVD; No carotid bruits LYMPHATICS: No lymphadenopathy CARDIAC: RR, no murmurs, rubs, gallops RESPIRATORY:  Clear to auscultation without rales, wheezing or rhonchi  ABDOMEN: Soft, non-tender, non-distended MUSCULOSKELETAL:  No edema; No deformity  SKIN: Warm and dry NEUROLOGIC:  Alert and oriented x 3    EKG:  EKG is ordered today.   Normal sinus rhythm at 81. She has a left bundle branch block.  Recent Labs: 12/11/2016: ALT 22; BUN 14; Creatinine, Ser 0.93; Hemoglobin 13.3; Platelets 287.0; Potassium 3.6; Sodium 141; TSH 3.98    Lipid Panel    Component Value Date/Time   CHOL 131 12/11/2016 1456   TRIG  167.0 (H) 12/11/2016 1456   HDL 57.30 12/11/2016 1456   CHOLHDL 2 12/11/2016 1456   VLDL 33.4 12/11/2016 1456   LDLCALC 40 12/11/2016 1456   LDLDIRECT 99.0 08/04/2015 1616      Wt Readings from Last 3 Encounters:  02/22/17 159 lb (72.1 kg)  01/01/17 160 lb (72.6 kg)  12/11/16 162 lb 4 oz (73.6 kg)      Other studies Reviewed: Additional studies/ records that were reviewed today include: . Review of the above records demonstrates:    ASSESSMENT AND PLAN:  1.  Chest pain  - She continues to have a minute and episodes of chest discomfort. Stress Myoview study was intermediate risk. She had fact that was likely due to breast shadow.  We'll get a coronary CT  angiogram.  I'll plan on seeing her again in one year. If the CT angiogram is abnormal, will need set her up for heart cath.  2. Hyperlipidemia - will recheck lipids at her next visit    3. Essential HTN:    Eats lots of salt.    Generally eats a very poor diet.    Current medicines are reviewed at length with the patient today.  The patient does not have concerns regarding medicines.  The following changes have been made:  no change  Labs/ tests ordered today include:   No orders of the defined types were placed in this encounter.    Disposition:   FU with me in 6 months      Mertie Moores, MD  02/22/2017 3:21 PM    [facility] [place], [place], [place]  [address] Phone: [phone]; Fax: [phone]

## 2017-02-22 NOTE — Patient Instructions (Addendum)
Medication Instructions:  Your physician recommends that you continue on your current medications as directed. Please refer to the Current Medication list given to you today.   Labwork: Your physician recommends that you return for lab work prior to your Coronary CT: basic metabolic panel    Testing/Procedures: Your physician recommends that you get a Coronary CT.  Non-Cardiac CT scanning, (CAT scanning), is a noninvasive, special x-ray that produces cross-sectional images of the body using x-rays and a computer. CT scans help physicians diagnose and treat medical conditions. For some CT exams, a contrast material is used to enhance visibility in the area of the body being studied. CT scans provide greater clarity and reveal more details than regular x-ray exams. **Someone from our office will call you to schedule   Follow-Up: Your physician wants you to follow-up in: 1 year with Dr. Acie Fredrickson.  You will receive a reminder letter in the mail two months in advance. If you don't receive a letter, please call our office to schedule the follow-up appointment.   If you need a refill on your cardiac medications before your next appointment, please call your pharmacy.   Thank you for choosing [facility]! Christen Bame, RN [phone]

## 2017-03-14 ENCOUNTER — Other Ambulatory Visit: Payer: Self-pay | Admitting: Internal Medicine

## 2017-03-14 DIAGNOSIS — K2100 Gastro-esophageal reflux disease with esophagitis, without bleeding: Secondary | ICD-10-CM

## 2017-03-15 ENCOUNTER — Encounter: Payer: Self-pay | Admitting: Cardiovascular Disease

## 2017-04-02 ENCOUNTER — Other Ambulatory Visit: Payer: Medicare Other

## 2017-04-05 ENCOUNTER — Other Ambulatory Visit: Payer: Medicare Other | Admitting: *Deleted

## 2017-04-05 DIAGNOSIS — R079 Chest pain, unspecified: Secondary | ICD-10-CM | POA: Diagnosis not present

## 2017-04-05 DIAGNOSIS — R9439 Abnormal result of other cardiovascular function study: Secondary | ICD-10-CM

## 2017-04-05 DIAGNOSIS — I447 Left bundle-branch block, unspecified: Secondary | ICD-10-CM

## 2017-04-06 LAB — BASIC METABOLIC PANEL
BUN/Creatinine Ratio: 11 — ABNORMAL LOW (ref 12–28)
BUN: 10 mg/dL (ref 8–27)
CALCIUM: 9.1 mg/dL (ref 8.7–10.3)
CO2: 28 mmol/L (ref 20–29)
CREATININE: 0.9 mg/dL (ref 0.57–1.00)
Chloride: 104 mmol/L (ref 96–106)
GFR, EST AFRICAN AMERICAN: 77 mL/min/{1.73_m2} (ref >59)
GFR, EST NON AFRICAN AMERICAN: 67 mL/min/{1.73_m2} (ref >59)
Glucose: 93 mg/dL (ref 65–99)
Potassium: 4.1 mmol/L (ref 3.5–5.2)
Sodium: 142 mmol/L (ref 134–144)

## 2017-04-08 ENCOUNTER — Other Ambulatory Visit: Payer: Self-pay | Admitting: Cardiovascular Disease

## 2017-04-09 ENCOUNTER — Ambulatory Visit (HOSPITAL_COMMUNITY): Payer: Medicare Other

## 2017-04-09 ENCOUNTER — Ambulatory Visit (HOSPITAL_COMMUNITY)
Admission: RE | Admit: 2017-04-09 | Discharge: 2017-04-09 | Disposition: A | Discharge status: Home / Self Care | Payer: Medicare Other | Source: Ambulatory Visit | Attending: Cardiovascular Disease | Admitting: Cardiovascular Disease

## 2017-04-09 DIAGNOSIS — I447 Left bundle-branch block, unspecified: Secondary | ICD-10-CM | POA: Diagnosis not present

## 2017-04-09 DIAGNOSIS — R9439 Abnormal result of other cardiovascular function study: Secondary | ICD-10-CM | POA: Insufficient documentation

## 2017-04-09 DIAGNOSIS — R911 Solitary pulmonary nodule: Secondary | ICD-10-CM | POA: Diagnosis not present

## 2017-04-09 DIAGNOSIS — R079 Chest pain, unspecified: Secondary | ICD-10-CM | POA: Diagnosis not present

## 2017-04-09 DIAGNOSIS — R0789 Other chest pain: Secondary | ICD-10-CM | POA: Diagnosis not present

## 2017-04-09 MED ORDER — NITROGLYCERIN 0.4 MG SL SUBL
0.8000 mg | SUBLINGUAL_TABLET | Freq: Once | SUBLINGUAL | Status: AC
  Administered 2017-04-09: 0.8 mg via SUBLINGUAL
  Filled 2017-04-09: qty 25

## 2017-04-09 MED ORDER — NITROGLYCERIN 0.4 MG SL SUBL
SUBLINGUAL_TABLET | SUBLINGUAL | Status: AC
  Filled 2017-04-09: qty 2

## 2017-04-09 MED ORDER — METOPROLOL TARTRATE 5 MG/5ML IV SOLN
INTRAVENOUS | Status: AC
  Filled 2017-04-09: qty 10

## 2017-04-09 MED ORDER — IOPAMIDOL (ISOVUE-370) INJECTION 76%
INTRAVENOUS | Status: AC
  Filled 2017-04-09: qty 100

## 2017-04-09 MED ORDER — METOPROLOL TARTRATE 5 MG/5ML IV SOLN
5.0000 mg | INTRAVENOUS | Status: DC | PRN
  Administered 2017-04-09 (×2): 5 mg via INTRAVENOUS
  Filled 2017-04-09 (×2): qty 5

## 2017-04-09 NOTE — Progress Notes (Signed)
CT scan completed. Tolerated well. D/C home with husband. Awake and alert. In no distress,.

## 2017-04-11 ENCOUNTER — Telehealth: Payer: Self-pay | Admitting: Cardiovascular Disease

## 2017-04-11 NOTE — Telephone Encounter (Signed)
New message     Patient calling for CT results. Please call

## 2017-04-11 NOTE — Telephone Encounter (Signed)
I spoke with pt and reviewed results with her.

## 2017-04-29 ENCOUNTER — Other Ambulatory Visit: Payer: Self-pay | Admitting: Internal Medicine

## 2017-04-29 DIAGNOSIS — I10 Essential (primary) hypertension: Secondary | ICD-10-CM

## 2017-06-03 ENCOUNTER — Other Ambulatory Visit: Payer: Self-pay | Admitting: Internal Medicine

## 2017-07-02 ENCOUNTER — Other Ambulatory Visit: Payer: Self-pay | Admitting: Internal Medicine

## 2017-07-02 DIAGNOSIS — G2581 Restless legs syndrome: Secondary | ICD-10-CM

## 2017-07-17 DIAGNOSIS — H43392 Other vitreous opacities, left eye: Secondary | ICD-10-CM | POA: Diagnosis not present

## 2017-07-17 DIAGNOSIS — H35362 Drusen (degenerative) of macula, left eye: Secondary | ICD-10-CM | POA: Diagnosis not present

## 2017-07-17 DIAGNOSIS — H35033 Hypertensive retinopathy, bilateral: Secondary | ICD-10-CM | POA: Diagnosis not present

## 2017-07-17 DIAGNOSIS — H43812 Vitreous degeneration, left eye: Secondary | ICD-10-CM | POA: Diagnosis not present

## 2017-07-19 ENCOUNTER — Other Ambulatory Visit: Payer: Self-pay | Admitting: Internal Medicine

## 2017-07-19 DIAGNOSIS — F450 Somatization disorder: Principal | ICD-10-CM

## 2017-07-19 DIAGNOSIS — F329 Major depressive disorder, single episode, unspecified: Secondary | ICD-10-CM

## 2017-09-05 ENCOUNTER — Other Ambulatory Visit: Payer: Self-pay | Admitting: Internal Medicine

## 2017-09-05 ENCOUNTER — Telehealth: Payer: Self-pay | Admitting: Internal Medicine

## 2017-09-05 NOTE — Telephone Encounter (Signed)
-----   Message from Janith Lima, MD sent at 04/10/2017  7:28 AM EST ----- Regarding: CT scan Lung nodule

## 2017-09-06 ENCOUNTER — Other Ambulatory Visit: Payer: Self-pay | Admitting: Internal Medicine

## 2017-09-27 ENCOUNTER — Encounter: Payer: Self-pay | Admitting: Internal Medicine

## 2017-09-27 ENCOUNTER — Ambulatory Visit (INDEPENDENT_AMBULATORY_CARE_PROVIDER_SITE_OTHER): Payer: Medicare Other | Admitting: Internal Medicine

## 2017-09-27 ENCOUNTER — Other Ambulatory Visit (INDEPENDENT_AMBULATORY_CARE_PROVIDER_SITE_OTHER): Payer: Medicare Other

## 2017-09-27 VITALS — BP 150/80 | HR 93 | Temp 98.4°F | Resp 16 | Ht 62.0 in | Wt 159.5 lb

## 2017-09-27 DIAGNOSIS — I10 Essential (primary) hypertension: Secondary | ICD-10-CM

## 2017-09-27 DIAGNOSIS — R0609 Other forms of dyspnea: Secondary | ICD-10-CM | POA: Diagnosis not present

## 2017-09-27 DIAGNOSIS — R519 Headache, unspecified: Secondary | ICD-10-CM

## 2017-09-27 DIAGNOSIS — R51 Headache: Secondary | ICD-10-CM

## 2017-09-27 LAB — CBC WITH DIFFERENTIAL/PLATELET
BASOS ABS: 0.1 10*3/uL (ref 0.0–0.1)
Basophils Relative: 1 % (ref 0.0–3.0)
EOS ABS: 0.2 10*3/uL (ref 0.0–0.7)
Eosinophils Relative: 3.4 % (ref 0.0–5.0)
HEMATOCRIT: 38.4 % (ref 36.0–46.0)
HEMOGLOBIN: 12.9 g/dL (ref 12.0–15.0)
LYMPHS PCT: 21.4 % (ref 12.0–46.0)
Lymphs Abs: 1.4 10*3/uL (ref 0.7–4.0)
MCHC: 33.5 g/dL (ref 30.0–36.0)
MCV: 89.7 fl (ref 78.0–100.0)
Monocytes Absolute: 0.7 10*3/uL (ref 0.1–1.0)
Monocytes Relative: 10.9 % (ref 3.0–12.0)
Neutro Abs: 4.2 10*3/uL (ref 1.4–7.7)
Neutrophils Relative %: 63.3 % (ref 43.0–77.0)
Platelets: 301 10*3/uL (ref 150.0–400.0)
RBC: 4.28 Mil/uL (ref 3.87–5.11)
RDW: 13.2 % (ref 11.5–15.5)
WBC: 6.6 10*3/uL (ref 4.0–10.5)

## 2017-09-27 LAB — COMPREHENSIVE METABOLIC PANEL
ALBUMIN: 4.1 g/dL (ref 3.5–5.2)
ALT: 33 U/L (ref 0–35)
AST: 24 U/L (ref 0–37)
Alkaline Phosphatase: 144 U/L — ABNORMAL HIGH (ref 39–117)
BILIRUBIN TOTAL: 0.5 mg/dL (ref 0.2–1.2)
BUN: 13 mg/dL (ref 6–23)
CALCIUM: 9.2 mg/dL (ref 8.4–10.5)
CO2: 31 meq/L (ref 19–32)
CREATININE: 0.89 mg/dL (ref 0.40–1.20)
Chloride: 102 mEq/L (ref 96–112)
GFR: 67.21 mL/min (ref >60.00)
Glucose, Bld: 105 mg/dL — ABNORMAL HIGH (ref 70–99)
Potassium: 3.5 mEq/L (ref 3.5–5.1)
SODIUM: 140 meq/L (ref 135–145)
Total Protein: 6.7 g/dL (ref 6.0–8.3)

## 2017-09-27 LAB — BRAIN NATRIURETIC PEPTIDE: PRO B NATRI PEPTIDE: 19 pg/mL (ref 0.0–100.0)

## 2017-09-27 LAB — SEDIMENTATION RATE: SED RATE: 10 mm/h (ref 0–30)

## 2017-09-27 MED ORDER — TELMISARTAN-HCTZ 40-12.5 MG PO TABS: 1.0000 | ORAL_TABLET | Freq: Every day | ORAL | 1 refills | Status: DC

## 2017-09-27 NOTE — Patient Instructions (Signed)

## 2017-09-27 NOTE — Progress Notes (Signed)
Subjective:  Patient ID: Vicki Martin, female    DOB: [birth year]  Age: 67 y.o. MRN: [id number]  CC: Hypertension   HPI Alysen S Hutcherson presents for a 42-month history of feeling poorly.  She describes a nagging posterior headache with severe dizziness, nausea, diffuse weakness, and fatigue.  She has also had a mild increase in shortness of breath and dyspnea on exertion.  She denies paresthesias, vomiting, chest pain, diaphoresis, lightheadedness, vertigo, palpitations, or edema.  She does get the sensation that there are jerks throughout her body and her scalp.  She also tells me that she has ended up taking both duloxetine and paroxetine.  She is not sure how that happened.  She has not been taking losartan or hydrochlorothiazide because she says it was pulled from the market.  Outpatient Medications Prior to Visit  Medication Sig Dispense Refill  . aspirin 81 MG tablet Take 81 mg by mouth daily.    Marland Kitchen atorvastatin (LIPITOR) 40 MG tablet TAKE ONE TABLET BY MOUTH ONCE DAILY 90 tablet 3  . carvedilol (COREG) 6.25 MG tablet Take one (1) tablet (6.25 mg) by mouth twice daily. 180 tablet 3  . DULoxetine (CYMBALTA) 60 MG capsule TAKE 1 CAPSULE BY MOUTH ONCE DAILY 90 capsule 1  . gabapentin (NEURONTIN) 300 MG capsule TAKE 1 CAPSULE BY MOUTH AT BEDTIME 90 capsule 1  . KLOR-CON M20 20 MEQ tablet TAKE 1 TABLET BY MOUTH TWICE DAILY 60 tablet 3  . omeprazole (PRILOSEC) 40 MG capsule TAKE 1 CAPSULE BY MOUTH ONCE DAILY 90 capsule 1  . losartan-hydrochlorothiazide (HYZAAR) 100-12.5 MG tablet TAKE 1 TABLET BY MOUTH ONCE DAILY 90 tablet 0  . PARoxetine (PAXIL) 10 MG tablet Take 10 mg by mouth daily.    Marland Kitchen PARoxetine (PAXIL) 10 MG tablet TAKE 1 TABLET BY MOUTH ONCE DAILY 90 tablet 1   No facility-administered medications prior to visit.     ROS Review of Systems  Constitutional: Positive for fatigue. Negative for activity change, appetite change and unexpected weight change.  HENT: Negative.   Negative for drooling and trouble swallowing.   Eyes: Negative for photophobia, pain and visual disturbance.  Respiratory: Positive for shortness of breath. Negative for cough, chest tightness and wheezing.   Cardiovascular: Negative for chest pain, palpitations and leg swelling.  Gastrointestinal: Negative for abdominal pain, constipation, diarrhea, nausea and vomiting.  Endocrine: Negative.   Genitourinary: Negative.  Negative for difficulty urinating, dysuria and frequency.  Musculoskeletal: Negative.  Negative for arthralgias, back pain, gait problem, myalgias and neck pain.  Skin: Negative.  Negative for color change, pallor and rash.  Allergic/Immunologic: Negative.   Neurological: Positive for dizziness, weakness and headaches. Negative for tremors, speech difficulty, light-headedness and numbness.  Hematological: Negative for adenopathy. Does not bruise/bleed easily.  Psychiatric/Behavioral: Positive for dysphoric mood. Negative for confusion, decreased concentration, self-injury, sleep disturbance and suicidal ideas. The patient is nervous/anxious.     Objective:  BP (!) 150/80 (BP Location: Left Arm, Patient Position: Sitting, Cuff Size: Normal)   Pulse 93   Temp 98.4 F (36.9 C) (Oral)   Resp 16   Ht 5\' 2"  (1.575 m)   Wt 159 lb 8 oz (72.3 kg)   SpO2 96%   BMI 29.17 kg/m   BP Readings from Last 3 Encounters:  09/27/17 (!) 150/80  04/09/17 116/69  02/22/17 128/72    Wt Readings from Last 3 Encounters:  09/27/17 159 lb 8 oz (72.3 kg)  02/22/17 159 lb (72.1 kg)  01/01/17 160  lb (72.6 kg)    Physical Exam  Constitutional: She is oriented to person, place, and time. No distress.  HENT:  Mouth/Throat: Oropharynx is clear and moist. No oropharyngeal exudate.  Eyes: Conjunctivae are normal. Left eye exhibits no discharge.  Neck: Normal range of motion. Neck supple. No JVD present. No thyromegaly present.  Cardiovascular: Normal rate, regular rhythm and normal heart  sounds. Exam reveals no gallop and no friction rub.  No murmur heard. EKG--  Sinus  Rhythm  -Left bundle branch block.   ABNORMAL-no change from the prior EKG  Pulmonary/Chest: Effort normal and breath sounds normal. No stridor. No respiratory distress. She has no wheezes. She has no rales.  Abdominal: Soft. Bowel sounds are normal. She exhibits no mass. There is no tenderness.  Musculoskeletal: Normal range of motion. She exhibits no edema, tenderness or deformity.  Lymphadenopathy:    She has no cervical adenopathy.  Neurological: She is alert and oriented to person, place, and time. She displays normal reflexes. No cranial nerve deficit or sensory deficit. She exhibits normal muscle tone. Coordination normal.  Skin: Skin is warm and dry. She is not diaphoretic.  Vitals reviewed.   Lab Results  Component Value Date   WBC 6.6 09/27/2017   HGB 12.9 09/27/2017   HCT 38.4 09/27/2017   PLT 301.0 09/27/2017   GLUCOSE 105 (H) 09/27/2017   CHOL 131 12/11/2016   TRIG 167.0 (H) 12/11/2016   HDL 57.30 12/11/2016   LDLDIRECT 99.0 08/04/2015   LDLCALC 40 12/11/2016   ALT 33 09/27/2017   AST 24 09/27/2017   NA 140 09/27/2017   K 3.5 09/27/2017   CL 102 09/27/2017   CREATININE 0.89 09/27/2017   BUN 13 09/27/2017   CO2 31 09/27/2017   TSH 3.37 09/27/2017   HGBA1C 5.5 03/09/2016    Ct Coronary Morph W/cta Cor W/score W/ca W/cm &/or Wo/cm  Addendum Date: 04/09/2017   ADDENDUM REPORT: 04/09/2017 17:49 CLINICAL DATA:  67 year old female with atypical chest pain and equivocal stress test. EXAM: Cardiac/Coronary  CT TECHNIQUE: The patient was scanned on a [employer]. FINDINGS: A 120 kV prospective scan was triggered in the descending thoracic aorta at 111 HU's. Axial non-contrast 3 mm slices were carried out through the heart. The data set was analyzed on a dedicated work station and scored using the [place]. Gantry rotation speed was 250 msecs and collimation was .6 mm. 5  mg of iv Metoprolol and 0.8 mg of sl NTG was given. The 3D data set was reconstructed in 5% intervals of the 67-82 % of the R-R cycle. Diastolic phases were analyzed on a dedicated work station using MPR, MIP and VRT modes. The patient received 80 cc of contrast. Aorta:  Normal size.  No calcifications.  No dissection. Aortic Valve:  Trileaflet.  No calcifications. Coronary Arteries:  Normal coronary origin.  Right dominance. RCA is a large dominant artery that gives rise to PDA and PLVB. There is no plaque. Left main is a large artery that gives rise to LAD and LCX arteries. LAD is a large vessel that gives rise to two diagonal arteries and has no plaque. LCX is a non-dominant artery that gives rise to one large OM1 branch. There is no plaque. Other findings: Normal pulmonary vein drainage into the left atrium. Normal let atrial appendage without a thrombus. Normal size of the pulmonary artery. IMPRESSION: 1. Coronary calcium score of 0. This was 0 percentile for age and sex matched control. 2. Normal coronary  origin with right dominance. 3. No evidence of CAD. Electronically Signed   By: Ena Dawley   On: 04/09/2017 17:49   Result Date: 04/09/2017 EXAM: OVER-READ INTERPRETATION  CT CHEST The following report is an over-read performed by radiologist Dr. Collene Leyden [facility] [place] on 04/09/2017. This over-read does not include interpretation of cardiac or coronary anatomy or pathology. The coronary CTA interpretation by the cardiologist is attached. COMPARISON:  None. FINDINGS: Vascular: Heart is normal size.  Visualized aorta is normal caliber. Mediastinum/Nodes: No adenopathy in the lower mediastinum or hila. Small hiatal hernia. Lungs/Pleura: Small nodule in the right lower lobe posteriorly on image 36 measures 6 mm. Visualized lungs otherwise clear. No effusions. Upper Abdomen: Imaging into the upper abdomen shows no acute findings. Musculoskeletal: Chest wall soft tissues are unremarkable.  No acute bony abnormality. IMPRESSION: 6 mm nodule in the posterior right lower lobe. Non-contrast chest CT at 6-12 months is recommended. If the nodule is stable at time of repeat CT, then future CT at 18-24 months (from today's scan) is considered optional for low-risk patients, but is recommended for high-risk patients. This recommendation follows the consensus statement: Guidelines for Management of Incidental Pulmonary Nodules Detected on CT Images: From the Fleischner Society 2017; Radiology 2017; 284:228-243. Electronically Signed: By: Rolm Baptise M.D. On: 04/09/2017 15:30    2017-  Overall Study Impression There is a medium defect of moderate severity present in the apical septal, apical inferior, apical lateral and apex location. The defect is non-reversible. In the setting of normal LVF, this is consistent with attenuation artifact. No ischemia noted.   There is a small defect of mild severity present in the basal inferior and basal inferolateral location. The defect is reversible and could represent a small area of ischemia but could also be due to variations in diaphragmatic attenuation. This is an intermediate risk study. Overall left ventricular systolic function was normal. LV cavity size is normal. Nuclear stress EF: 58%. The left ventricular ejection fraction is normal (55-65%). There is no prior study for comparison.     Assessment & Plan:   Ansleigh was seen today for hypertension.  Diagnoses and all orders for this visit:  Essential hypertension-her blood pressure is not adequately well controlled.  Will restart the ARB/thiazide diuretic combination.  Her labs are negative for secondary causes or endorgan damage. -     Comprehensive metabolic panel; Future -     CBC with Differential/Platelet; Future -     Thyroid Panel With TSH; Future -     telmisartan-hydrochlorothiazide (MICARDIS HCT) 40-12.5 MG tablet; Take 1 tablet by mouth daily.  DOE (dyspnea on exertion)- Her EKG  reveals a persistent left bundle blanch block which is unchanged over the last few years.  She had a normal screening for ischemia 2 years ago.  I do not think her symptoms are related to cardiac ischemia and her BNP is normal so I do not think she is experiencing heart failure.  I think her symptoms are consistent with anxiety, obesity, and poor conditioning.  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 -     EKG 12-Lead -     CBC with Differential/Platelet; Future -     Brain natriuretic peptide; Future -     Thyroid Panel With TSH; Future  Intractable episodic headache, unspecified headache type- Her lab  work is negative for concerns for vasculitis.  I have asked her to undergo an MRI to see if there is a tumor, mass, bleed, or CVA. -     MR Brain Wo Contrast; Future -     Sedimentation rate; Future   I have discontinued Dorleen S. Sou's PARoxetine, PARoxetine, and losartan-hydrochlorothiazide. I am also having her start on telmisartan-hydrochlorothiazide. Additionally, I am having her maintain her aspirin, atorvastatin, omeprazole, carvedilol, KLOR-CON M20, gabapentin, and DULoxetine.  Meds ordered this encounter  Medications  . telmisartan-hydrochlorothiazide (MICARDIS HCT) 40-12.5 MG tablet    Sig: Take 1 tablet by mouth daily.    Dispense:  90 tablet    Refill:  1     Follow-up: Return in about 3 months (around 12/28/2017).  Scarlette Calico, MD

## 2017-09-28 ENCOUNTER — Encounter: Payer: Self-pay | Admitting: Internal Medicine

## 2017-09-28 LAB — THYROID PANEL WITH TSH
Free Thyroxine Index: 2.7 (ref 1.4–3.8)
T3 Uptake: 26 % (ref 22–35)
T4, Total: 10.2 ug/dL (ref 5.1–11.9)
TSH: 3.37 m[IU]/L (ref 0.40–4.50)

## 2017-09-29 ENCOUNTER — Other Ambulatory Visit: Payer: Self-pay | Admitting: Cardiovascular Disease

## 2017-09-29 ENCOUNTER — Other Ambulatory Visit: Payer: Self-pay | Admitting: Internal Medicine

## 2017-09-29 DIAGNOSIS — I10 Essential (primary) hypertension: Secondary | ICD-10-CM

## 2017-10-02 ENCOUNTER — Telehealth: Payer: Self-pay

## 2017-10-02 NOTE — Telephone Encounter (Addendum)
Key: [id number]

## 2017-10-10 ENCOUNTER — Telehealth: Payer: Self-pay | Admitting: Internal Medicine

## 2017-10-10 IMAGING — MR MR HEAD W/O CM
10 series · 48 of 48 positions shown · non-contrast
Comparison: None.

CLINICAL DATA: Headache and paresthesias.

EXAM:
MRI HEAD WITHOUT CONTRAST
TECHNIQUE: Multiplanar, multiecho pulse sequences of the brain and surrounding
structures were obtained without intravenous contrast.

[Series 5: T1 · sagittal · 4.0mm · 0.75mm/px · 3 of 28 slices shown (1 of 2)]
[im 1/28]
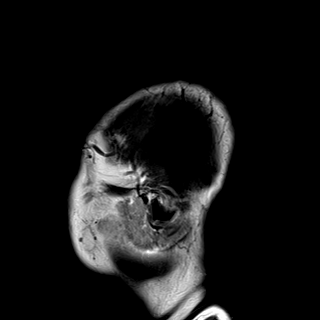
[im 14/28]
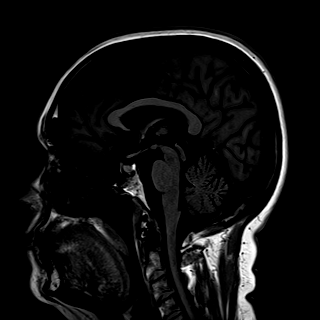
[im 28/28]
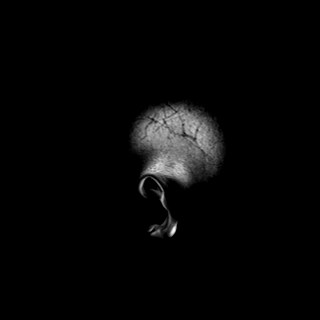

[Series 6: DWI · axial · 3.0mm · 1.44mm/px · z∈[-65,+76]mm · 7 of 88 slices shown (1 of 4)]
[im 1/88]
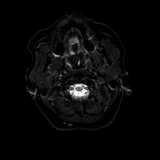
[im 15/88]
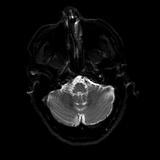
[im 30/88]
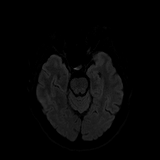
[im 44/88]
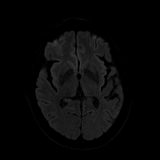
[im 59/88]
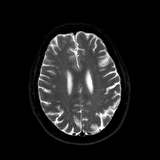
[im 73/88]
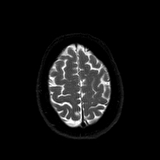
[im 88/88]
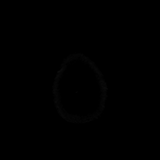

[Series 7: DWI · axial · 3.0mm · 1.44mm/px · z∈[-65,+76]mm · 4 of 44 slices shown (2 of 4)]
[im 1/44]
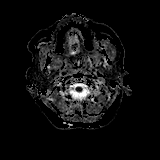
[im 15/44]
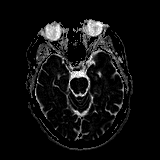
[im 29/44]
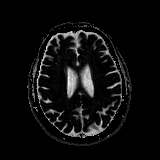
[im 44/44]
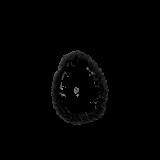

[Series 8: DWI · coronal · 5.0mm · 1.44mm/px · 5 of 60 slices shown (3 of 4)]
[im 1/60]
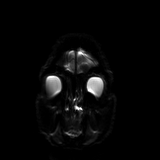
[im 15/60]
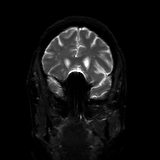
[im 30/60]
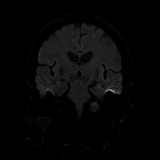
[im 45/60]
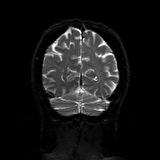
[im 60/60]
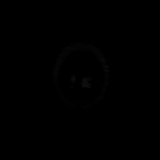

[Series 9: DWI · coronal · 5.0mm · 1.44mm/px · 3 of 30 slices shown (4 of 4)]
[im 1/30]
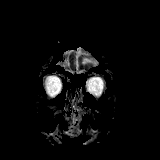
[im 15/30]
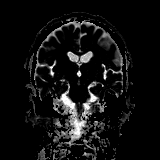
[im 30/30]
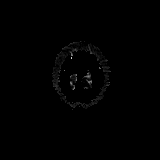

[Series 10: T2 · axial · 4.0mm · 0.36mm/px · z∈[-65,+75]mm · 2 of 28 slices shown (1 of 2)]
[im 1/28]
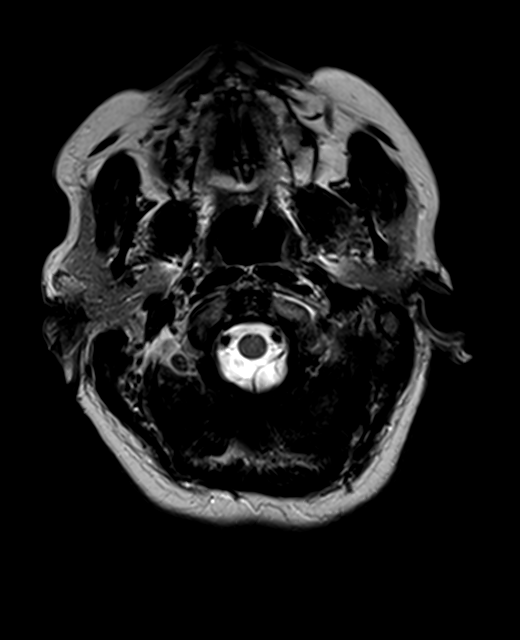
[im 28/28]
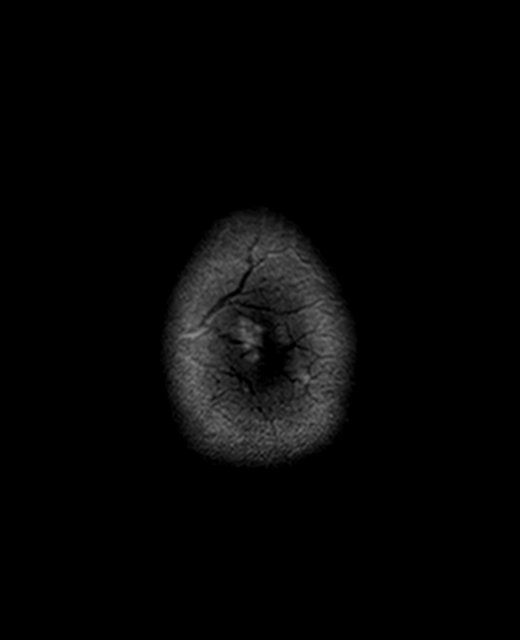

[Series 11: FLAIR · axial · 4.0mm · 0.72mm/px · z∈[-65,+75]mm · 2 of 28 slices shown]
[im 1/28]
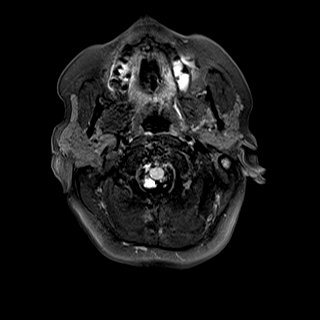
[im 28/28]
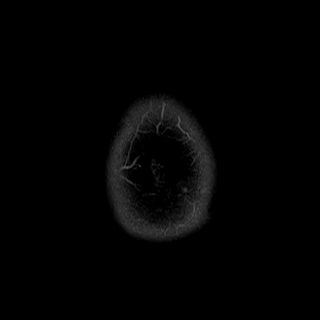

[Series 13: swi_images · axial · 1.5mm · 0.90mm/px · z∈[-66,+77]mm · 8 of 96 slices shown]
[im 1/96]
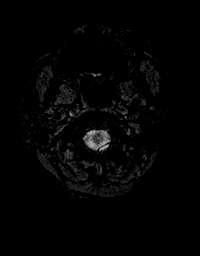
[im 14/96]
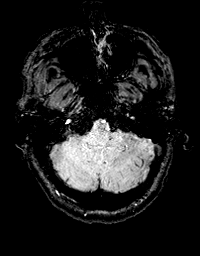
[im 28/96]
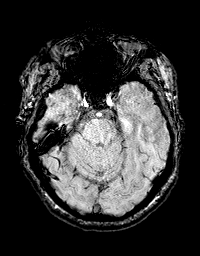
[im 41/96]
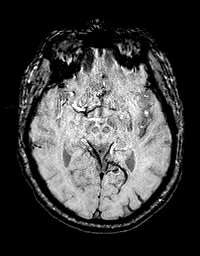
[im 55/96]
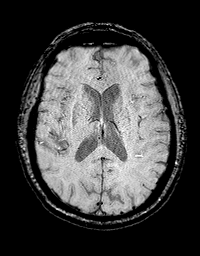
[im 68/96]
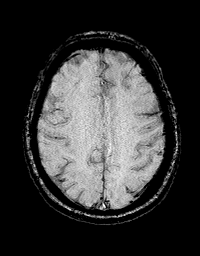
[im 82/96]
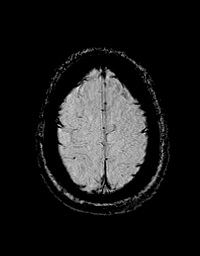
[im 96/96]
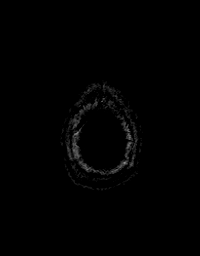

[Series 14: T1 · axial · 1.0mm · 0.90mm/px · z∈[-66,+77]mm · 12 of 144 slices shown (2 of 2)]
[im 1/144]
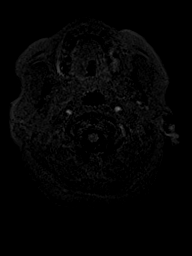
[im 14/144]
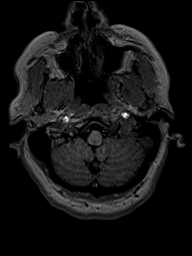
[im 27/144]
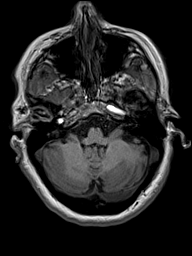
[im 40/144]
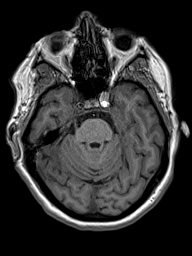
[im 53/144]
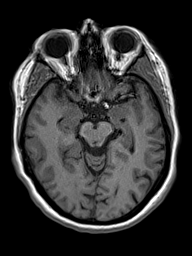
[im 66/144]
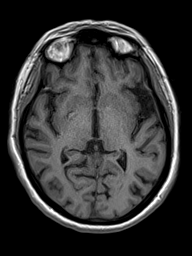
[im 79/144]
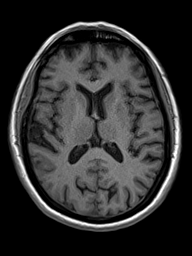
[im 92/144]
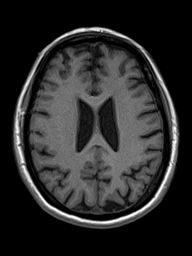
[im 105/144]
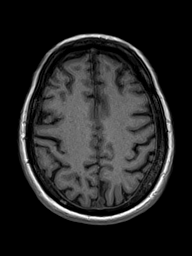
[im 118/144]
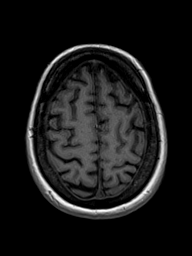
[im 131/144]
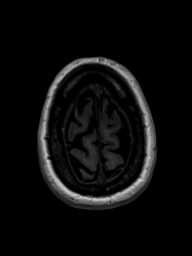
[im 144/144]
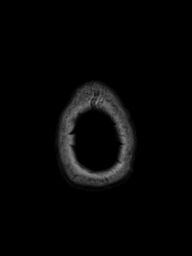

[Series 15: T2 · coronal · 4.5mm · 0.36mm/px · 2 of 28 slices shown (2 of 2)]
[im 1/28]
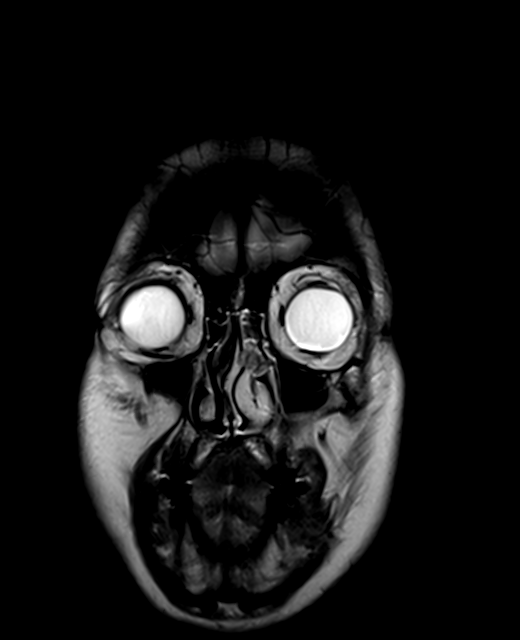
[im 28/28]
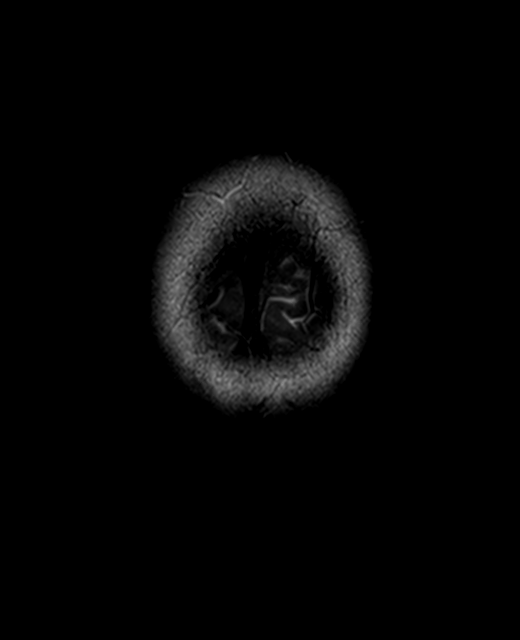

[48 of 48 positions shown; findings below may reference images not displayed]

FINDINGS: Calvarium and upper cervical spine: Prominent cervical facet
arthropathy. No focal marrow lesion.

Orbits: Negative.

Sinuses and Mastoids: Clear.

Brain: No specific explanation for headache. No acute remote
infarct, hemorrhage, hydrocephalus, or mass lesion. No evidence of
large vessel occlusion. Mild for age small foci of cerebral white
matter signal abnormality with frontal predominance. This is likely
related to chronic microvascular ischemia, especially given
patient's history of hypertension, but complicated migraines would
have the same appearance. No specific demyelinating pattern to
explain paresthesias.
IMPRESSION: 1. No specific or reversible abnormality.
2. Mild white matter disease, likely secondary to patient's history
of hypertension and migraine.

## 2017-10-10 MED ORDER — LOSARTAN POTASSIUM-HCTZ 100-12.5 MG PO TABS: 1.0000 | ORAL_TABLET | Freq: Every day | ORAL | 0 refills | Status: DC

## 2017-10-10 NOTE — Telephone Encounter (Signed)
I was not able to get the telmisartan approved. Is there an alternative medication.   Can you advise in PCP absence?

## 2017-10-10 NOTE — Telephone Encounter (Signed)
Insurance calling requesting call back regarding PA Ref#  [id number] Ph [phone]

## 2017-10-10 NOTE — Telephone Encounter (Signed)
I spoke with patient today and info given regarding new script being faxed in. I asked that she call us back if different script was too expensive.

## 2017-10-10 NOTE — Telephone Encounter (Signed)
Copied from [place] [phone]. Topic: Quick Communication - See Telephone Encounter >> Oct 10, 2017  2:42 PM Aurelio Brash B wrote: CRM for notification. See Telephone encounter for: 10/10/17.  Pt states  she was suppose to have a  new blood pressure medication sent in--- she states telmisartan-hydrochlorothiazide   is too expensive.  [place], [place] - [address] [phone] (Phone) [phone] (Fax)

## 2017-10-10 NOTE — Telephone Encounter (Signed)
She should be able to get Losartan HCT without any difficulty at this point. I am going to send that in for her. If she can't get it or it costs too much, let us know.

## 2017-10-11 NOTE — Telephone Encounter (Signed)
rx has been changed

## 2017-10-12 ENCOUNTER — Other Ambulatory Visit: Payer: Self-pay | Admitting: Internal Medicine

## 2017-10-12 DIAGNOSIS — K2100 Gastro-esophageal reflux disease with esophagitis, without bleeding: Secondary | ICD-10-CM

## 2017-10-25 ENCOUNTER — Ambulatory Visit
Admission: RE | Admit: 2017-10-25 | Discharge: 2017-10-25 | Disposition: A | Discharge status: Home / Self Care | Payer: Medicare Other | Source: Ambulatory Visit | Attending: Internal Medicine | Admitting: Internal Medicine

## 2017-10-25 DIAGNOSIS — R42 Dizziness and giddiness: Secondary | ICD-10-CM | POA: Diagnosis not present

## 2017-10-25 DIAGNOSIS — R51 Headache: Principal | ICD-10-CM

## 2017-10-25 DIAGNOSIS — R519 Headache, unspecified: Secondary | ICD-10-CM

## 2017-10-27 ENCOUNTER — Encounter: Payer: Self-pay | Admitting: Internal Medicine

## 2017-12-18 ENCOUNTER — Other Ambulatory Visit: Payer: Self-pay | Admitting: Family

## 2018-01-08 ENCOUNTER — Ambulatory Visit (INDEPENDENT_AMBULATORY_CARE_PROVIDER_SITE_OTHER): Payer: Medicare Other | Admitting: *Deleted

## 2018-01-08 VITALS — BP 124/82 | HR 70 | Resp 18 | Ht 62.0 in | Wt 159.0 lb

## 2018-01-08 DIAGNOSIS — Z23 Encounter for immunization: Secondary | ICD-10-CM | POA: Diagnosis not present

## 2018-01-08 DIAGNOSIS — Z0000 Encounter for general adult medical examination without abnormal findings: Secondary | ICD-10-CM | POA: Diagnosis not present

## 2018-01-08 NOTE — Progress Notes (Addendum)
Subjective:   Vicki Martin is a 67 y.o. female who presents for Medicare Annual (Subsequent) preventive examination.  Review of Systems:  No ROS.  Medicare Wellness Visit. Additional risk factors are reflected in the social history.  Cardiac Risk Factors include: advanced age (>58men, >26 women);dyslipidemia;hypertension Sleep patterns: has frequent nighttime awakenings, gets up 2 times nightly to void and sleeps 4-5 hours nightly. Patient reports insomnia issues, discussed recommended sleep tips and stress reduction tips.   Home Safety/Smoke Alarms: Feels safe in home. Smoke alarms in place.  Living environment; residence and Firearm Safety: 1-story house/ trailer, no firearms. Lives with husband, no needs for DME, limited support system Seat Belt Safety/Bike Helmet: Wears seat belt.      Objective:     Vitals: BP 124/82   Pulse 70   Resp 18   Ht 5\' 2"  (1.575 m)   Wt 159 lb (72.1 kg)   SpO2 98%   BMI 29.08 kg/m   Body mass index is 29.08 kg/m.  Advanced Directives 01/08/2018 01/01/2017 08/27/2015 08/08/2015  Does Patient Have a Medical Advance Directive? Yes Yes Yes No  Type of Paramedic of [place];Living will [place];Living will [place] in Chart? - No - copy requested - -  Would patient like information on creating a medical advance directive? - - - No - patient declined information    Tobacco Social History   Tobacco Use  Smoking Status Never Smoker  Smokeless Tobacco Never Used     Counseling given: Not Answered  Past Medical History:  Diagnosis Date  . Anxiety   . Arthritis   . Depression   . Family history of pancreatic cancer   . Family history of stomach cancer   . GERD (gastroesophageal reflux disease)   . Heart murmur   . Hyperlipidemia    no medications  . Hypertension   . Migraines   . Sleep apnea    pt declines CPAP   Past Surgical  History:  Procedure Laterality Date  . ABDOMINAL HYSTERECTOMY     reports complet but with ovaries retained  . CHOLECYSTECTOMY    . removed hip bone to replace vertebrae     Family History  Problem Relation Age of Onset  . Rheum arthritis Mother   . Stomach cancer Mother 46  . Pancreatic cancer Mother 4  . Stomach cancer Maternal Aunt   . Pancreatic cancer Maternal Aunt   . Heart disease Father   . Hypertension Sister   . Lung cancer Brother        deceased  . Early death Brother   . Liver cancer Brother   . Hypertension Brother   . COPD Brother   . Cancer Maternal Uncle        NOS  . Cancer Maternal Aunt        NOS  . Lung cancer Other   . Ovarian cysts Daughter   . Diabetes Neg Hx   . Alcohol abuse Neg Hx   . Stroke Neg Hx   . Colon cancer Neg Hx   . Esophageal cancer Neg Hx   . Rectal cancer Neg Hx    Social History   Socioeconomic History  . Marital status: Married    Spouse name: Not on file  . Number of children: 3  . Years of education: Not on file  . Highest education level: Not on file  Occupational History  .  Not on file  Social Needs  . Financial resource strain: Not hard at all  . Food insecurity:    Worry: Never true    Inability: Never true  . Transportation needs:    Medical: No    Non-medical: No  Tobacco Use  . Smoking status: Never Smoker  . Smokeless tobacco: Never Used  Substance and Sexual Activity  . Alcohol use: No    Alcohol/week: 0.0 standard drinks  . Drug use: No  . Sexual activity: Not Currently  Lifestyle  . Physical activity:    Days per week: 2 days    Minutes per session: 30 min  . Stress: Rather much  Relationships  . Social connections:    Talks on phone: More than three times a week    Gets together: More than three times a week    Attends religious service: Not on file    Active member of club or organization: Not on file    Attends meetings of clubs or organizations: Not on file    Relationship status:  Married  Other Topics Concern  . 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 atorvastatin (LIPITOR) 40 MG tablet TAKE 1 TABLET BY MOUTH ONCE DAILY  . carvedilol (COREG) 6.25 MG tablet Take one (1) tablet (6.25 mg) by mouth twice daily.  . DULoxetine (CYMBALTA) 60 MG capsule TAKE 1 CAPSULE BY MOUTH ONCE DAILY  . losartan-hydrochlorothiazide (HYZAAR) 100-12.5 MG tablet TAKE 1 TABLET BY MOUTH ONCE DAILY  . omeprazole (PRILOSEC) 40 MG capsule TAKE 1 CAPSULE BY MOUTH ONCE DAILY  . [DISCONTINUED] aspirin 81 MG tablet Take 81 mg by mouth daily.  . [DISCONTINUED] gabapentin (NEURONTIN) 300 MG capsule TAKE 1 CAPSULE BY MOUTH AT BEDTIME (Patient not taking: Reported on 01/08/2018)  . [DISCONTINUED] potassium chloride SA (K-DUR,KLOR-CON) 20 MEQ tablet TAKE 1 TABLET BY MOUTH TWICE DAILY (Patient not taking: Reported on 01/08/2018)   No facility-administered encounter medications on file as of 01/08/2018.     Activities of Daily Living In your present state of health, do you have any difficulty performing the following activities: 01/08/2018  Hearing? N  Vision? N  Difficulty concentrating or making decisions? N  Walking or climbing stairs? N  Dressing or bathing? N  Doing errands, shopping? N  Preparing Food and eating ? N  Using the Toilet? N  In the past six months, have you accidently leaked urine? Y  Do you have problems with loss of bowel control? N  Managing your Medications? N  Managing your Finances? N  Housekeeping or managing your Housekeeping? N  Some recent data might be hidden    Patient Care Team: Janith Lima, MD as PCP - General (Internal Medicine) Nahser, Wonda Cheng, MD as  Consulting Physician (Cardiology)    Assessment:   This is a routine wellness examination for [name]. Physical assessment deferred to PCP.   Exercise Activities and Dietary recommendations Current Exercise Habits: Home exercise routine, Type of exercise: walking, Time (Minutes): 30, Frequency (Times/Week): 3, Weekly Exercise (Minutes/Week): 90, Intensity: Mild, Exercise limited by: None identified  Diet (meal preparation, eat out,  water intake, caffeinated beverages, dairy products, fruits and vegetables): in general, a "healthy" diet  , well balanced   Reviewed heart healthy diet  Goals    . Enjoy life and family     Continue to see my grandchildren as often as possible and work in my garden.    . Patient Stated     Increase the amount that I walk around the neighborhood when the weather gets better and walk on my treadmill. I will decrease the amount fried foods I eat.        Fall Risk Fall Risk  01/08/2018 01/01/2017 12/15/2016 08/08/2015  Falls in the past year? No No No No  Comment - - Emmi Telephone Survey: data to providers prior to load -   Depression Screen PHQ 2/9 Scores 01/08/2018 09/27/2017 01/01/2017 12/11/2016  PHQ - 2 Score 1 5 2 4   PHQ- 9 Score 6 19 8 18      Cognitive Function       Ad8 score reviewed for issues:  Issues making decisions: no  Less interest in hobbies / activities: no  Repeats questions, stories (family complaining): no  Trouble using ordinary gadgets (microwave, computer, phone):no  Forgets the month or year: no  Mismanaging finances: no  Remembering appts: no  Daily problems with thinking and/or memory: no Ad8 score is= 0  Immunization History  Administered Date(s) Administered  . Influenza, High Dose Seasonal PF 03/09/2016  . Influenza,inj,Quad PF,6+ Mos 08/04/2015  . Pneumococcal Conjugate-13 12/11/2016  . Pneumococcal Polysaccharide-23 01/08/2018  . Td 07/27/2001  . Tdap 08/04/2015   Screening Tests Health Maintenance    Topic Date Due  . Hepatitis C Screening  30-Apr-1951  . MAMMOGRAM  01/18/2013  . PNA vac Low Risk Adult (2 of 2 - PPSV23) 12/11/2017  . INFLUENZA VACCINE  12/27/2017  . TETANUS/TDAP  08/03/2025  . COLONOSCOPY  09/19/2025  . DEXA SCAN  Completed      Plan:    Patient states she will call and make a mammogram appointment and request they sent the report to PCP.   Continue doing brain stimulating activities (puzzles, reading, adult coloring books, staying active) to keep memory sharp.   Continue to eat heart healthy diet (full of fruits, vegetables, whole grains, lean protein, water--limit salt, fat, and sugar intake) and increase physical activity as tolerated.  I have personally reviewed and noted the following in the patient's chart:   . Medical and social history . Use of alcohol, tobacco or illicit drugs  . Current medications and supplements . Functional ability and status . Nutritional status . Physical activity . Advanced directives . List of other physicians . Vitals . Screenings to include cognitive, depression, and falls . Referrals and appointments  In addition, I have reviewed and discussed with patient certain preventive protocols, quality metrics, and best practice recommendations. A written personalized care plan for preventive services as well as general preventive health recommendations were provided to patient.     Michiel Cowboy, RN  01/08/2018   Medical screening examination/treatment/procedure(s) were performed by non-physician practitioner and as supervising physician I was immediately available for consultation/collaboration. I agree with above. Scarlette Calico, MD

## 2018-01-08 NOTE — Patient Instructions (Addendum)
Continue doing brain stimulating activities (puzzles, reading, adult coloring books, staying active) to keep memory sharp.   Continue to eat heart healthy diet (full of fruits, vegetables, whole grains, lean protein, water--limit salt, fat, and sugar intake) and increase physical activity as tolerated.   Vicki Martin , Thank you for taking time to come for your Medicare Wellness Visit. I appreciate your ongoing commitment to your health goals. Please review the following plan we discussed and let me know if I can assist you in the future.   These are the goals we discussed: Goals    . Enjoy life and family     Continue to see my grandchildren as often as possible and work in my garden.    . Patient Stated     Increase the amount that I walk around the neighborhood when the weather gets better and walk on my treadmill. I will decrease the amount fried foods I eat.        This is a list of the screening recommended for you and due dates:  Health Maintenance  Topic Date Due  .  Hepatitis C: One time screening is recommended by Center for Disease Control  (CDC) for  adults born from [birth year] through 1965.   1950-07-01  . Mammogram  01/18/2013  . Pneumonia vaccines (2 of 2 - PPSV23) 12/11/2017  . Flu Shot  12/27/2017  . Tetanus Vaccine  08/03/2025  . Colon Cancer Screening  09/19/2025  . DEXA scan (bone density measurement)  Completed   Health Maintenance, Female Adopting a healthy lifestyle and getting preventive care can go a long way to promote health and wellness. Talk with your health care provider about what schedule of regular examinations is right for you. This is a good chance for you to check in with your provider about disease prevention and staying healthy. In between checkups, there are plenty of things you can do on your own. Experts have done a lot of research about which lifestyle changes and preventive measures are most likely to keep you healthy. Ask your health care provider  for more information. Weight and diet Eat a healthy diet  Be sure to include plenty of vegetables, fruits, low-fat dairy products, and lean protein.  Do not eat a lot of foods high in solid fats, added sugars, or salt.  Get regular exercise. This is one of the most important things you can do for your health. ? Most adults should exercise for at least 150 minutes each week. The exercise should increase your heart rate and make you sweat (moderate-intensity exercise). ? Most adults should also do strengthening exercises at least twice a week. This is in addition to the moderate-intensity exercise.  Maintain a healthy weight  Body mass index (BMI) is a measurement that can be used to identify possible weight problems. It estimates body fat based on height and weight. Your health care provider can help determine your BMI and help you achieve or maintain a healthy weight.  For females 26 years of age and older: ? A BMI below 18.5 is considered underweight. ? A BMI of 18.5 to 24.9 is normal. ? A BMI of 25 to 29.9 is considered overweight. ? A BMI of 30 and above is considered obese.  Watch levels of cholesterol and blood lipids  You should start having your blood tested for lipids and cholesterol at 67 years of age, then have this test every 5 years.  You may need to have your cholesterol levels  checked more often if: ? Your lipid or cholesterol levels are high. ? You are older than 67 years of age. ? You are at high risk for heart disease.  Cancer screening Lung Cancer  Lung cancer screening is recommended for adults 37-57 years old who are at high risk for lung cancer because of a history of smoking.  A yearly low-dose CT scan of the lungs is recommended for people who: ? Currently smoke. ? Have quit within the past 15 years. ? Have at least a 30-pack-year history of smoking. A pack year is smoking an average of one pack of cigarettes a day for 1 year.  Yearly screening should  continue until it has been 15 years since you quit.  Yearly screening should stop if you develop a health problem that would prevent you from having lung cancer treatment.  Breast Cancer  Practice breast self-awareness. This means understanding how your breasts normally appear and feel.  It also means doing regular breast self-exams. Let your health care provider know about any changes, no matter how small.  If you are in your 20s or 30s, you should have a clinical breast exam (CBE) by a health care provider every 1-3 years as part of a regular health exam.  If you are 66 or older, have a CBE every year. Also consider having a breast X-ray (mammogram) every year.  If you have a family history of breast cancer, talk to your health care provider about genetic screening.  If you are at high risk for breast cancer, talk to your health care provider about having an MRI and a mammogram every year.  Breast cancer gene (BRCA) assessment is recommended for women who have family members with BRCA-related cancers. BRCA-related cancers include: ? Breast. ? Ovarian. ? Tubal. ? Peritoneal cancers.  Results of the assessment will determine the need for genetic counseling and BRCA1 and BRCA2 testing.  Cervical Cancer Your health care provider may recommend that you be screened regularly for cancer of the pelvic organs (ovaries, uterus, and vagina). This screening involves a pelvic examination, including checking for microscopic changes to the surface of your cervix (Pap test). You may be encouraged to have this screening done every 3 years, beginning at age 52.  For women ages 59-65, health care providers may recommend pelvic exams and Pap testing every 3 years, or they may recommend the Pap and pelvic exam, combined with testing for human papilloma virus (HPV), every 5 years. Some types of HPV increase your risk of cervical cancer. Testing for HPV may also be done on women of any age with unclear Pap  test results.  Other health care providers may not recommend any screening for nonpregnant women who are considered low risk for pelvic cancer and who do not have symptoms. Ask your health care provider if a screening pelvic exam is right for you.  If you have had past treatment for cervical cancer or a condition that could lead to cancer, you need Pap tests and screening for cancer for at least 20 years after your treatment. If Pap tests have been discontinued, your risk factors (such as having a new sexual partner) need to be reassessed to determine if screening should resume. Some women have medical problems that increase the chance of getting cervical cancer. In these cases, your health care provider may recommend more frequent screening and Pap tests.  Colorectal Cancer  This type of cancer can be detected and often prevented.  Routine colorectal cancer screening  usually begins at 67 years of age and continues through 67 years of age.  Your health care provider may recommend screening at an earlier age if you have risk factors for colon cancer.  Your health care provider may also recommend using home test kits to check for hidden blood in the stool.  A small camera at the end of a tube can be used to examine your colon directly (sigmoidoscopy or colonoscopy). This is done to check for the earliest forms of colorectal cancer.  Routine screening usually begins at age 4.  Direct examination of the colon should be repeated every 5-10 years through 67 years of age. However, you may need to be screened more often if early forms of precancerous polyps or small growths are found.  Skin Cancer  Check your skin from head to toe regularly.  Tell your health care provider about any new moles or changes in moles, especially if there is a change in a mole's shape or color.  Also tell your health care provider if you have a mole that is larger than the size of a pencil eraser.  Always use  sunscreen. Apply sunscreen liberally and repeatedly throughout the day.  Protect yourself by wearing long sleeves, pants, a wide-brimmed hat, and sunglasses whenever you are outside.  Heart disease, diabetes, and high blood pressure  High blood pressure causes heart disease and increases the risk of stroke. High blood pressure is more likely to develop in: ? People who have blood pressure in the high end of the normal range (130-139/85-89 mm Hg). ? People who are overweight or obese. ? People who are African American.  If you are 49-42 years of age, have your blood pressure checked every 3-5 years. If you are 30 years of age or older, have your blood pressure checked every year. You should have your blood pressure measured twice-once when you are at a hospital or clinic, and once when you are not at a hospital or clinic. Record the average of the two measurements. To check your blood pressure when you are not at a hospital or clinic, you can use: ? An automated blood pressure machine at a pharmacy. ? A home blood pressure monitor.  If you are between 85 years and 38 years old, ask your health care provider if you should take aspirin to prevent strokes.  Have regular diabetes screenings. This involves taking a blood sample to check your fasting blood sugar level. ? If you are at a normal weight and have a low risk for diabetes, have this test once every three years after 67 years of age. ? If you are overweight and have a high risk for diabetes, consider being tested at a younger age or more often. Preventing infection Hepatitis B  If you have a higher risk for hepatitis B, you should be screened for this virus. You are considered at high risk for hepatitis B if: ? You were born in a country where hepatitis B is common. Ask your health care provider which countries are considered high risk. ? Your parents were born in a high-risk country, and you have not been immunized against hepatitis B  (hepatitis B vaccine). ? You have HIV or AIDS. ? You use needles to inject street drugs. ? You live with someone who has hepatitis B. ? You have had sex with someone who has hepatitis B. ? You get hemodialysis treatment. ? You take certain medicines for conditions, including cancer, organ transplantation, and autoimmune conditions.  Hepatitis C  Blood testing is recommended for: ? Everyone born from [birth year] through 1965. ? Anyone with known risk factors for hepatitis C.  Sexually transmitted infections (STIs)  You should be screened for sexually transmitted infections (STIs) including gonorrhea and chlamydia if: ? You are sexually active and are younger than 67 years of age. ? You are older than 67 years of age and your health care provider tells you that you are at risk for this type of infection. ? Your sexual activity has changed since you were last screened and you are at an increased risk for chlamydia or gonorrhea. Ask your health care provider if you are at risk.  If you do not have HIV, but are at risk, it may be recommended that you take a prescription medicine daily to prevent HIV infection. This is called pre-exposure prophylaxis (PrEP). You are considered at risk if: ? You are sexually active and do not regularly use condoms or know the HIV status of your partner(s). ? You take drugs by injection. ? You are sexually active with a partner who has HIV.  Talk with your health care provider about whether you are at high risk of being infected with HIV. If you choose to begin PrEP, you should first be tested for HIV. You should then be tested every 3 months for as long as you are taking PrEP. Pregnancy  If you are premenopausal and you may become pregnant, ask your health care provider about preconception counseling.  If you may become pregnant, take 400 to 800 micrograms (mcg) of folic acid every day.  If you want to prevent pregnancy, talk to your health care provider about  birth control (contraception). Osteoporosis and menopause  Osteoporosis is a disease in which the bones lose minerals and strength with aging. This can result in serious bone fractures. Your risk for osteoporosis can be identified using a bone density scan.  If you are 15 years of age or older, or if you are at risk for osteoporosis and fractures, ask your health care provider if you should be screened.  Ask your health care provider whether you should take a calcium or vitamin D supplement to lower your risk for osteoporosis.  Menopause may have certain physical symptoms and risks.  Hormone replacement therapy may reduce some of these symptoms and risks. Talk to your health care provider about whether hormone replacement therapy is right for you. Follow these instructions at home:  Schedule regular health, dental, and eye exams.  Stay current with your immunizations.  Do not use any tobacco products including cigarettes, chewing tobacco, or electronic cigarettes.  If you are pregnant, do not drink alcohol.  If you are breastfeeding, limit how much and how often you drink alcohol.  Limit alcohol intake to no more than 1 drink per day for nonpregnant women. One drink equals 12 ounces of beer, 5 ounces of wine, or 1 ounces of hard liquor.  Do not use street drugs.  Do not share needles.  Ask your health care provider for help if you need support or information about quitting drugs.  Tell your health care provider if you often feel depressed.  Tell your health care provider if you have ever been abused or do not feel safe at home. This information is not intended to replace advice given to you by your health care provider. Make sure you discuss any questions you have with your health care provider. Document Released: 11/28/2010 Document Revised: 10/21/2015 Document Reviewed: 02/16/2015  Chartered certified accountant Patient Education  [employer].

## 2018-02-07 ENCOUNTER — Other Ambulatory Visit: Payer: Self-pay | Admitting: Internal Medicine

## 2018-02-07 DIAGNOSIS — G2581 Restless legs syndrome: Secondary | ICD-10-CM

## 2018-02-15 DIAGNOSIS — Z1231 Encounter for screening mammogram for malignant neoplasm of breast: Secondary | ICD-10-CM | POA: Diagnosis not present

## 2018-02-15 LAB — HM MAMMOGRAPHY

## 2018-02-25 ENCOUNTER — Other Ambulatory Visit: Payer: Self-pay | Admitting: Internal Medicine

## 2018-02-25 ENCOUNTER — Encounter: Payer: Self-pay | Admitting: Internal Medicine

## 2018-02-25 DIAGNOSIS — F329 Major depressive disorder, single episode, unspecified: Secondary | ICD-10-CM

## 2018-02-25 DIAGNOSIS — F32A Depression, unspecified: Secondary | ICD-10-CM

## 2018-02-25 DIAGNOSIS — F450 Somatization disorder: Principal | ICD-10-CM

## 2018-02-25 NOTE — Telephone Encounter (Signed)
Patient scheduled oct 22

## 2018-02-25 NOTE — Telephone Encounter (Signed)
Pt is due for follow up per LOV with PCP.  I have sent in a 30 day supply. No refills until pt is seen.

## 2018-03-19 ENCOUNTER — Ambulatory Visit: Payer: Medicare Other | Admitting: Internal Medicine

## 2018-04-08 ENCOUNTER — Other Ambulatory Visit: Payer: Self-pay | Admitting: Internal Medicine

## 2018-04-08 ENCOUNTER — Ambulatory Visit: Payer: Medicare Other | Admitting: Internal Medicine

## 2018-04-08 DIAGNOSIS — F450 Somatization disorder: Principal | ICD-10-CM

## 2018-04-08 DIAGNOSIS — F329 Major depressive disorder, single episode, unspecified: Secondary | ICD-10-CM

## 2018-04-22 ENCOUNTER — Other Ambulatory Visit (INDEPENDENT_AMBULATORY_CARE_PROVIDER_SITE_OTHER): Payer: Medicare Other

## 2018-04-22 ENCOUNTER — Encounter: Payer: Self-pay | Admitting: Internal Medicine

## 2018-04-22 ENCOUNTER — Ambulatory Visit (INDEPENDENT_AMBULATORY_CARE_PROVIDER_SITE_OTHER): Payer: Medicare Other | Admitting: Internal Medicine

## 2018-04-22 VITALS — BP 140/80 | HR 66 | Temp 98.1°F | Ht 62.0 in | Wt 155.8 lb

## 2018-04-22 DIAGNOSIS — E781 Pure hyperglyceridemia: Secondary | ICD-10-CM

## 2018-04-22 DIAGNOSIS — E7800 Pure hypercholesterolemia, unspecified: Secondary | ICD-10-CM

## 2018-04-22 DIAGNOSIS — R918 Other nonspecific abnormal finding of lung field: Secondary | ICD-10-CM | POA: Diagnosis not present

## 2018-04-22 DIAGNOSIS — R911 Solitary pulmonary nodule: Secondary | ICD-10-CM

## 2018-04-22 DIAGNOSIS — I10 Essential (primary) hypertension: Secondary | ICD-10-CM

## 2018-04-22 DIAGNOSIS — Z23 Encounter for immunization: Secondary | ICD-10-CM | POA: Diagnosis not present

## 2018-04-22 LAB — BASIC METABOLIC PANEL
BUN: 11 mg/dL (ref 6–23)
CO2: 29 mEq/L (ref 19–32)
Calcium: 9.2 mg/dL (ref 8.4–10.5)
Chloride: 100 mEq/L (ref 96–112)
Creatinine, Ser: 0.93 mg/dL (ref 0.40–1.20)
GFR: 63.78 mL/min (ref >60.00)
GLUCOSE: 97 mg/dL (ref 70–99)
POTASSIUM: 3.5 meq/L (ref 3.5–5.1)
SODIUM: 137 meq/L (ref 135–145)

## 2018-04-22 LAB — LIPID PANEL
Cholesterol: 108 mg/dL (ref 0–200)
HDL: 48.8 mg/dL (ref >39.00)
LDL Cholesterol: 33 mg/dL (ref 0–99)
NONHDL: 59.31
Total CHOL/HDL Ratio: 2
Triglycerides: 134 mg/dL (ref 0.0–149.0)
VLDL: 26.8 mg/dL (ref 0.0–40.0)

## 2018-04-22 MED ORDER — ZOSTER VAC RECOMB ADJUVANTED 50 MCG/0.5ML IM SUSR: 0.5000 mL | Freq: Once | INTRAMUSCULAR | 1 refills | Status: AC

## 2018-04-22 NOTE — Patient Instructions (Signed)

## 2018-04-22 NOTE — Progress Notes (Signed)
Subjective:  Patient ID: Vicki Martin, female    DOB: Nov 23, 1950  Age: 67 y.o. MRN: [id number]  CC: Hypertension and Hyperlipidemia   HPI Vicki Martin for f/up - She feels well.  She has been able to lose 4 pounds since I last saw her.  She tells me her blood pressure has been well controlled.  Outpatient Medications Prior to Visit  Medication Sig Dispense Refill  . atorvastatin (LIPITOR) 40 MG tablet TAKE 1 TABLET BY MOUTH ONCE DAILY 90 tablet 1  . carvedilol (COREG) 6.25 MG tablet Take one (1) tablet (6.25 mg) by mouth twice daily. 180 tablet 3  . DULoxetine (CYMBALTA) 60 MG capsule TAKE 1 CAPSULE BY MOUTH ONCE DAILY 90 capsule 1  . gabapentin (NEURONTIN) 300 MG capsule TAKE 1 CAPSULE BY MOUTH AT BEDTIME 90 capsule 1  . losartan-hydrochlorothiazide (HYZAAR) 100-12.5 MG tablet TAKE 1 TABLET BY MOUTH ONCE DAILY 90 tablet 0  . omeprazole (PRILOSEC) 40 MG capsule TAKE 1 CAPSULE BY MOUTH ONCE DAILY 90 capsule 1   No facility-administered medications prior to visit.     ROS Review of Systems  Constitutional: Negative for diaphoresis and fatigue.  HENT: Negative.   Eyes: Negative for visual disturbance.  Respiratory: Negative for cough, chest tightness, shortness of breath and wheezing.   Cardiovascular: Negative for chest pain, palpitations and leg swelling.  Gastrointestinal: Negative for abdominal pain, constipation, diarrhea, nausea and vomiting.  Genitourinary: Negative.   Musculoskeletal: Negative.  Negative for arthralgias and myalgias.  Skin: Negative.   Neurological: Negative for weakness and light-headedness.  Hematological: Negative for adenopathy. Does not bruise/bleed easily.  Psychiatric/Behavioral: Negative.     Objective:  BP 140/80 (BP Location: Left Arm, Patient Position: Sitting, Cuff Size: Normal)   Pulse 66   Temp 98.1 F (36.7 C) (Oral)   Ht 5\' 2"  (1.575 m)   Wt 155 lb 12 oz (70.6 kg)   SpO2 93%   BMI 28.49 kg/m   BP Readings from  Last 3 Encounters:  04/22/18 140/80  01/08/18 124/82  09/27/17 (!) 150/80    Wt Readings from Last 3 Encounters:  04/22/18 155 lb 12 oz (70.6 kg)  01/08/18 159 lb (72.1 kg)  09/27/17 159 lb 8 oz (72.3 kg)    Physical Exam  Constitutional: She is oriented to person, place, and time. No distress.  HENT:  Mouth/Throat: Oropharynx is clear and moist. No oropharyngeal exudate.  Eyes: Conjunctivae are normal. No scleral icterus.  Neck: Normal range of motion. Neck supple. No JVD present. No thyromegaly present.  Cardiovascular: Normal rate and regular rhythm.  No murmur heard. Pulmonary/Chest: Effort normal and breath sounds normal. She has no wheezes. She has no rales.  Abdominal: Soft. Bowel sounds are normal. She exhibits no mass. There is no hepatosplenomegaly. There is no tenderness.  Musculoskeletal: Normal range of motion. She exhibits no edema, tenderness or deformity.  Lymphadenopathy:    She has no cervical adenopathy.  Neurological: She is alert and oriented to person, place, and time.  Skin: Skin is warm and dry. She is not diaphoretic. No pallor.    Lab Results  Component Value Date   WBC 6.6 09/27/2017   HGB 12.9 09/27/2017   HCT 38.4 09/27/2017   PLT 301.0 09/27/2017   GLUCOSE 97 04/22/2018   CHOL 108 04/22/2018   TRIG 134.0 04/22/2018   HDL 48.80 04/22/2018   LDLDIRECT 99.0 08/04/2015   LDLCALC 33 04/22/2018   ALT 33 09/27/2017   AST 24 09/27/2017  NA 137 04/22/2018   K 3.5 04/22/2018   CL 100 04/22/2018   CREATININE 0.93 04/22/2018   BUN 11 04/22/2018   CO2 29 04/22/2018   TSH 3.37 09/27/2017   HGBA1C 5.5 03/09/2016    Mr Brain Wo Contrast  Result Date: 10/25/2017 CLINICAL DATA:  Headache with dizziness, nausea, weakness and fatigue. Symptoms began several months ago. EXAM: MRI HEAD WITHOUT CONTRAST TECHNIQUE: Multiplanar, multiecho pulse sequences of the brain and surrounding structures were obtained without intravenous contrast. COMPARISON:   08/15/2015 FINDINGS: Brain: Diffusion imaging is normal. The brain shows mild age related generalized volume loss. There are mild chronic small-vessel ischemic changes of the hemispheric white matter, similar to the previous study. No cortical or large vessel territory abnormality. No mass lesion, hemorrhage, hydrocephalus or extra-axial collection. Vascular: Major vessels at the base of the brain show flow. Skull and upper cervical spine: Negative Sinuses/Orbits: Clear/normal Other: None IMPRESSION: No acute or reversible finding. No cause of the presenting symptoms is identified. Mild age related atrophy with mild small vessel change of the hemispheric white matter, not visibly progressive since 2017. Electronically Signed   By: Nelson Chimes M.D.   On: 10/25/2017 20:37    Assessment & Plan:   Karsynn was seen today for hypertension and hyperlipidemia.  Diagnoses and all orders for this visit:  Essential hypertension-  Her BP is well controlled.  Electrolytes and renal function are normal. -     Basic metabolic panel; Future  Hypertriglyceridemia- Her triglycerides are normal now.  She was praised for her lifestyle modifications. -     Lipid panel; Future  Pure hypercholesterolemia- She has achieved her LDL goal is doing well on the statin. -     Lipid panel; Future  Need for influenza vaccination -     Flu vaccine HIGH DOSE PF (Fluzone High dose)  Nodule of middle lobe of right lung- She is due for a one-year follow-up on this. -     CT Chest Wo Contrast; Future  Abnormal findings on diagnostic imaging of lung -     CT Chest Wo Contrast; Future  Need for shingles vaccine -     Zoster Vaccine Adjuvanted [facility]) injection; Inject 0.5 mLs into the muscle once for 1 dose.   I am having Vicki Martin start on Zoster Vaccine Adjuvanted. I am also having her maintain her carvedilol, atorvastatin, omeprazole, losartan-hydrochlorothiazide, gabapentin, and DULoxetine.  Meds ordered  this encounter  Medications  . Zoster Vaccine Adjuvanted [facility]) injection    Sig: Inject 0.5 mLs into the muscle once for 1 dose.    Dispense:  0.5 mL    Refill:  1     Follow-up: Return in about 6 months (around 10/21/2018).  Scarlette Calico, MD

## 2018-04-23 ENCOUNTER — Encounter: Payer: Self-pay | Admitting: Internal Medicine

## 2018-05-06 ENCOUNTER — Other Ambulatory Visit: Payer: Self-pay | Admitting: Internal Medicine

## 2018-05-16 ENCOUNTER — Ambulatory Visit (INDEPENDENT_AMBULATORY_CARE_PROVIDER_SITE_OTHER)
Admission: RE | Admit: 2018-05-16 | Discharge: 2018-05-16 | Disposition: A | Discharge status: Home / Self Care | Payer: Medicare Other | Source: Ambulatory Visit | Attending: Internal Medicine | Admitting: Internal Medicine

## 2018-05-16 DIAGNOSIS — R918 Other nonspecific abnormal finding of lung field: Secondary | ICD-10-CM | POA: Diagnosis not present

## 2018-05-16 DIAGNOSIS — R911 Solitary pulmonary nodule: Secondary | ICD-10-CM | POA: Diagnosis not present

## 2018-05-18 ENCOUNTER — Encounter: Payer: Self-pay | Admitting: Internal Medicine

## 2018-05-21 ENCOUNTER — Other Ambulatory Visit: Payer: Self-pay | Admitting: Internal Medicine

## 2018-05-21 ENCOUNTER — Other Ambulatory Visit: Payer: Self-pay | Admitting: Cardiovascular Disease

## 2018-05-21 DIAGNOSIS — K2100 Gastro-esophageal reflux disease with esophagitis, without bleeding: Secondary | ICD-10-CM

## 2018-06-14 ENCOUNTER — Other Ambulatory Visit: Payer: Self-pay | Admitting: Cardiovascular Disease

## 2018-06-22 ENCOUNTER — Other Ambulatory Visit: Payer: Self-pay | Admitting: Cardiovascular Disease

## 2018-07-10 ENCOUNTER — Other Ambulatory Visit: Payer: Self-pay | Admitting: Cardiovascular Disease

## 2018-07-23 ENCOUNTER — Other Ambulatory Visit: Payer: Self-pay | Admitting: Cardiovascular Disease

## 2018-07-25 ENCOUNTER — Other Ambulatory Visit: Payer: Self-pay | Admitting: *Deleted

## 2018-07-25 ENCOUNTER — Encounter: Payer: Self-pay | Admitting: Cardiovascular Disease

## 2018-07-25 ENCOUNTER — Ambulatory Visit (INDEPENDENT_AMBULATORY_CARE_PROVIDER_SITE_OTHER): Payer: Medicare Other | Admitting: Cardiovascular Disease

## 2018-07-25 VITALS — BP 118/76 | HR 77 | Ht 60.0 in | Wt 152.8 lb

## 2018-07-25 DIAGNOSIS — R0789 Other chest pain: Secondary | ICD-10-CM | POA: Diagnosis not present

## 2018-07-25 DIAGNOSIS — I10 Essential (primary) hypertension: Secondary | ICD-10-CM

## 2018-07-25 NOTE — Patient Instructions (Signed)
Medication Instructions:  Your physician recommends that you continue on your current medications as directed. Please refer to the Current Medication list given to you today.  If you need a refill on your cardiac medications before your next appointment, please call your pharmacy.   Lab work: None Ordered   Testing/Procedures: None Ordered    Follow-Up: At [facility], you and your health needs are our priority.  As part of our continuing mission to provide you with exceptional heart care, we have created designated Provider Care Teams.  These Care Teams include your primary Cardiologist (physician) and Advanced Practice Providers (APPs -  Physician Assistants and Nurse Practitioners) who all work together to provide you with the care you need, when you need it. You will need a follow up appointment in:   As Needed.   You may see Dr. Acie Fredrickson or one of the following Advanced Practice Providers on your designated Care Team: Richardson Dopp, PA-C [place], [place] . Daune Perch, NP

## 2018-07-25 NOTE — Progress Notes (Signed)
Cardiology Office Note   Date:  07/25/2018   ID:  Vicki Martin, DOB [birth year], MRN [id number]  PCP:  Janith Lima, MD  Cardiologist:   Mertie Moores, MD   Chief Complaint  Patient presents with  . Chest Pain   Problem List 1. Essential HTN 2. Murmur 3. Obstructive sleep apnea    -  Does not have  CPAP ( was not mentioned by her doctor )  4. Hyperlipidemia    Previous notes   Vicki Martin is a 68 y.o. female who presents for evaluation of heart murmur Also has had squeezing her chest for the past 3 months . Tightness  Not exertional , not related to twising of her torso .  Not related to exertion Lasts few seconds.   is associated with dyspnea.   Able to bring in the groceries without any problems  Gets SOB and dizzy when she walks too far when shopping   Has very high triglyceride levels.  Was not able to afford the medication ..   Oct. 24, 2017 Doing ok from a cardiac standpoint Has some occasional left sided chest pain .   Is not sleeping well.   She had a Myoview study in April, 2017 which revealed a apical defect that was most likely artifact. She has normal left ventricle systolic function. There is no evidence of ischemia. The study was intermediate risk.  Sept. 27, 2018:   Vicki Martin is seen today  BP looks good Continues to have CP  Pain starts in her back and  radiates through to her chest  Gets throat tightness.   Associated with dyspnea,  Goes to her throat. Comes on with rest or exertion.   Has awakened her at night.   Does not get any regular exercise  Now has some knee issues and is not able to walk much   Feb. 27, 2020   Vicki Martin was having some episodes of chest pain at her last office visit.  We obtained a CT coronary angiogram. The aorta was normal. Coronary calcium score was 0.  Was normal coronary arteries. Getting some exercise   Past Medical History:  Diagnosis Date  . Anxiety   . Arthritis   . Depression   . Family  history of pancreatic cancer   . Family history of stomach cancer   . GERD (gastroesophageal reflux disease)   . Heart murmur   . Hyperlipidemia    no medications  . Hypertension   . Migraines   . Sleep apnea    pt declines CPAP    Past Surgical History:  Procedure Laterality Date  . ABDOMINAL HYSTERECTOMY     reports complet but with ovaries retained  . CHOLECYSTECTOMY    . removed hip bone to replace vertebrae       Current Outpatient Medications  Medication Sig Dispense Refill  . atorvastatin (LIPITOR) 40 MG tablet Take 1 tablet (40 mg total) by mouth daily. 30 tablet 0  . carvedilol (COREG) 6.25 MG tablet TAKE 1 TABLET BY MOUTH TWICE DAILY. PLEASE MAKE  OVERDUE APPOINTMENT FOR FUTURE REFILLS [phone] 60 tablet 0  . DULoxetine (CYMBALTA) 60 MG capsule TAKE 1 CAPSULE BY MOUTH ONCE DAILY 90 capsule 1  . gabapentin (NEURONTIN) 300 MG capsule TAKE 1 CAPSULE BY MOUTH AT BEDTIME 90 capsule 1  . losartan-hydrochlorothiazide (HYZAAR) 100-12.5 MG tablet TAKE 1 TABLET BY MOUTH ONCE DAILY 90 tablet 1  . omeprazole (PRILOSEC) 40 MG capsule Take 1 capsule (40 mg  total) by mouth daily. 90 capsule 1   No current facility-administered medications for this visit.     Allergies:   Codeine and Penicillins    Social History:  The patient  reports that she has never smoked. She has never used smokeless tobacco. She reports that she does not drink alcohol or use drugs.   Family History:  The patient's family history includes COPD in her brother; Cancer in her maternal aunt and maternal uncle; Early death in her brother; Heart disease in her father; Hypertension in her brother and sister; Liver cancer in her brother; Lung cancer in her brother and another family member; Ovarian cysts in her daughter; Pancreatic cancer in her maternal aunt; Pancreatic cancer (age of onset: 44) in her mother; Rheum arthritis in her mother; Stomach cancer in her maternal aunt; Stomach cancer (age of onset: 5) in  her mother.    ROS:  Please see the history of present illness.    Physical Exam: Blood pressure 118/76, pulse 77, height 5' (1.524 m), weight 152 lb 12.8 oz (69.3 kg), SpO2 92 %.  GEN:  Well nourished, well developed in no acute distress HEENT: Normal NECK: No JVD; No carotid bruits LYMPHATICS: No lymphadenopathy CARDIAC: RRR , no murmurs, rubs, gallops RESPIRATORY:  Clear to auscultation without rales, wheezing or rhonchi  ABDOMEN: Soft, non-tender, non-distended MUSCULOSKELETAL:  No edema; No deformity  SKIN: Warm and dry NEUROLOGIC:  Alert and oriented x 3   EKG:   July 25, 2018: Normal sinus rhythm at 75 beats a minute.  Nonspecific IVCD/left bundle branch block  Recent Labs: 09/27/2017: ALT 33; Hemoglobin 12.9; Platelets 301.0; Pro B Natriuretic peptide (BNP) 19.0; TSH 3.37 04/22/2018: BUN 11; Creatinine, Ser 0.93; Potassium 3.5; Sodium 137    Lipid Panel    Component Value Date/Time   CHOL 108 04/22/2018 1627   TRIG 134.0 04/22/2018 1627   HDL 48.80 04/22/2018 1627   CHOLHDL 2 04/22/2018 1627   VLDL 26.8 04/22/2018 1627   LDLCALC 33 04/22/2018 1627   LDLDIRECT 99.0 08/04/2015 1616      Wt Readings from Last 3 Encounters:  07/25/18 152 lb 12.8 oz (69.3 kg)  04/22/18 155 lb 12 oz (70.6 kg)  01/08/18 159 lb (72.1 kg)      Other studies Reviewed: Additional studies/ records that were reviewed today include: . Review of the above records demonstrates:    ASSESSMENT AND PLAN:  1.  Chest pain  -non cardiac CP .   Coronary CT anginogram was normal -   Coronary calcium score of 0  Will have her follow up with her primary MD     2. Hyperlipidemia   - stable ,  Will follow up with her pirmary MD   3. Essential HTN:    - BP is well controlled.     Current medicines are reviewed at length with the patient today.  The patient does not have concerns regarding medicines.  The following changes have been made:  no change  Labs/ tests ordered today include:     No orders of the defined types were placed in this encounter.   Disposition:   FU with me as needed ,  Will have her follow up with her pirmary MD      Mertie Moores, MD  07/25/2018 2:53 PM    [address], [place], [place]  [address] Phone: [phone]; Fax: [phone]

## 2018-08-21 ENCOUNTER — Other Ambulatory Visit: Payer: Self-pay | Admitting: Internal Medicine

## 2018-08-21 NOTE — Telephone Encounter (Signed)
Requested medication (s) are due for refill today: Yes  Requested medication (s) are on the active medication list: Yes  Last refill:  07/10/18 and 07/23/18  Future visit scheduled: No  Notes to clinic:  Last refilled by another provider     Requested Prescriptions  Pending Prescriptions Disp Refills   atorvastatin (LIPITOR) 40 MG tablet 30 tablet 0    Sig: Take 1 tablet (40 mg total) by mouth daily.     Cardiovascular:  Antilipid - Statins Passed - 08/21/2018  4:16 PM      Passed - Total Cholesterol in normal range and within 360 days    Cholesterol  Date Value Ref Range Status  04/22/2018 108 0 - 200 mg/dL Final    Comment:    ATP III Classification       Desirable:  < 200 mg/dL               Borderline High:  200 - 239 mg/dL          High:  > = 240 mg/dL         Passed - LDL in normal range and within 360 days    LDL Cholesterol  Date Value Ref Range Status  04/22/2018 33 0 - 99 mg/dL Final         Passed - HDL in normal range and within 360 days    HDL  Date Value Ref Range Status  04/22/2018 48.80 >39.00 mg/dL Final         Passed - Triglycerides in normal range and within 360 days    Triglycerides  Date Value Ref Range Status  04/22/2018 134.0 0.0 - 149.0 mg/dL Final    Comment:    Normal:  <150 mg/dLBorderline High:  150 - 199 mg/dL         Passed - Patient is not pregnant      Passed - Valid encounter within last 12 months    Recent Outpatient Visits          4 months ago Essential hypertension   [place], Thomas L, MD   10 months ago Essential hypertension   [place], Thomas L, MD   1 year ago Depression with somatization   [place], Thomas L, MD   1 year ago Estrogen deficiency   [place], Thomas L, MD   2 years ago Plantar fasciitis, bilateral   [place], MD             carvedilol (COREG) 6.25 MG tablet 60 tablet 0    Sig: TAKE 1 TABLET BY MOUTH TWICE DAILY. PLEASE MAKE  OVERDUE APPOINTMENT FOR FUTURE REFILLS [phone]     Cardiovascular:  Beta Blockers Passed - 08/21/2018  4:16 PM      Passed - Last BP in normal range    BP Readings from Last 1 Encounters:  07/25/18 118/76         Passed - Last Heart Rate in normal range    Pulse Readings from Last 1 Encounters:  07/25/18 77         Passed - Valid encounter within last 6 months    Recent Outpatient Visits          4 months ago Essential hypertension   [place], Thomas L, MD   10 months ago Essential hypertension  [facility] -Mayer Camel, MD   1 year ago Depression with somatization   [facility] -Mayer Camel, MD   1 year ago Estrogen deficiency   [facility] -Mayer Camel, MD   2 years ago Plantar fasciitis, bilateral   [facility] -Mayer Camel, MD

## 2018-08-22 MED ORDER — ATORVASTATIN CALCIUM 40 MG PO TABS: 40.0000 mg | ORAL_TABLET | Freq: Every day | ORAL | 0 refills | Status: DC

## 2018-08-22 MED ORDER — CARVEDILOL 6.25 MG PO TABS: ORAL_TABLET | ORAL | 0 refills | Status: DC

## 2018-09-17 ENCOUNTER — Other Ambulatory Visit: Payer: Self-pay | Admitting: Internal Medicine

## 2018-09-17 DIAGNOSIS — G2581 Restless legs syndrome: Secondary | ICD-10-CM

## 2018-10-10 ENCOUNTER — Other Ambulatory Visit: Payer: Self-pay | Admitting: Internal Medicine

## 2018-10-15 ENCOUNTER — Other Ambulatory Visit: Payer: Self-pay | Admitting: Internal Medicine

## 2018-10-15 DIAGNOSIS — I10 Essential (primary) hypertension: Secondary | ICD-10-CM

## 2018-10-15 MED ORDER — CARVEDILOL 6.25 MG PO TABS: ORAL_TABLET | ORAL | 0 refills | Status: DC

## 2018-10-15 NOTE — Telephone Encounter (Signed)
Patient called regarding medication refill  Carvedilol 6.25 MG Patient is fine with PCP declining refill. But she would like to discuss what will happen when she stops taking medication.  She wants to make sure she will not go through any withdrawals  Patient call back# [phone]

## 2018-10-15 NOTE — Telephone Encounter (Addendum)
Pt informed of below. 6 mo f/u scheduled. COVID19 screened.

## 2018-10-15 NOTE — Telephone Encounter (Signed)
RX sent to [facility]

## 2018-10-24 ENCOUNTER — Ambulatory Visit (INDEPENDENT_AMBULATORY_CARE_PROVIDER_SITE_OTHER): Payer: Medicare Other | Admitting: Internal Medicine

## 2018-10-24 ENCOUNTER — Other Ambulatory Visit (INDEPENDENT_AMBULATORY_CARE_PROVIDER_SITE_OTHER): Payer: Medicare Other

## 2018-10-24 ENCOUNTER — Other Ambulatory Visit: Payer: Self-pay

## 2018-10-24 ENCOUNTER — Encounter: Payer: Self-pay | Admitting: Internal Medicine

## 2018-10-24 VITALS — BP 118/72 | HR 79 | Temp 98.4°F | Resp 16 | Ht 60.0 in | Wt 153.0 lb

## 2018-10-24 DIAGNOSIS — E781 Pure hyperglyceridemia: Secondary | ICD-10-CM | POA: Diagnosis not present

## 2018-10-24 DIAGNOSIS — K2100 Gastro-esophageal reflux disease with esophagitis, without bleeding: Secondary | ICD-10-CM

## 2018-10-24 DIAGNOSIS — Z1159 Encounter for screening for other viral diseases: Secondary | ICD-10-CM | POA: Diagnosis not present

## 2018-10-24 DIAGNOSIS — E7800 Pure hypercholesterolemia, unspecified: Secondary | ICD-10-CM

## 2018-10-24 DIAGNOSIS — I10 Essential (primary) hypertension: Secondary | ICD-10-CM

## 2018-10-24 LAB — CBC WITH DIFFERENTIAL/PLATELET
Basophils Absolute: 0.1 10*3/uL (ref 0.0–0.1)
Basophils Relative: 1.4 % (ref 0.0–3.0)
Eosinophils Absolute: 0.3 10*3/uL (ref 0.0–0.7)
Eosinophils Relative: 3.8 % (ref 0.0–5.0)
HCT: 39.9 % (ref 36.0–46.0)
Hemoglobin: 13.6 g/dL (ref 12.0–15.0)
Lymphocytes Relative: 23.1 % (ref 12.0–46.0)
Lymphs Abs: 1.8 10*3/uL (ref 0.7–4.0)
MCHC: 34.2 g/dL (ref 30.0–36.0)
MCV: 89 fl (ref 78.0–100.0)
Monocytes Absolute: 0.8 10*3/uL (ref 0.1–1.0)
Monocytes Relative: 10.4 % (ref 3.0–12.0)
Neutro Abs: 4.7 10*3/uL (ref 1.4–7.7)
Neutrophils Relative %: 61.3 % (ref 43.0–77.0)
Platelets: 286 10*3/uL (ref 150.0–400.0)
RBC: 4.48 Mil/uL (ref 3.87–5.11)
RDW: 13.3 % (ref 11.5–15.5)
WBC: 7.6 10*3/uL (ref 4.0–10.5)

## 2018-10-24 NOTE — Patient Instructions (Signed)

## 2018-10-24 NOTE — Progress Notes (Signed)
Subjective:  Patient ID: Vicki Martin, female    DOB: [birth year]  Age: 68 y.o. MRN: [id number]  CC: Hypertension and Hyperlipidemia   HPI Vicki Martin presents for f/up - She complains of intermittent episodes of dizziness and lightheadedness and is concerned that her blood pressure may be overcontrolled.  She denies CP, DOE, palpitations, edema, or fatigue.  Outpatient Medications Prior to Visit  Medication Sig Dispense Refill   atorvastatin (LIPITOR) 40 MG tablet Take 1 tablet (40 mg total) by mouth daily. 90 tablet 0   carvedilol (COREG) 6.25 MG tablet TAKE 1 TABLET BY MOUTH TWICE DAILY. 180 tablet 0   DULoxetine (CYMBALTA) 60 MG capsule TAKE 1 CAPSULE BY MOUTH ONCE DAILY 90 capsule 1   gabapentin (NEURONTIN) 300 MG capsule Take 1 capsule by mouth at bedtime 90 capsule 3   omeprazole (PRILOSEC) 40 MG capsule Take 1 capsule (40 mg total) by mouth daily. 90 capsule 1   losartan-hydrochlorothiazide (HYZAAR) 100-12.5 MG tablet TAKE 1 TABLET BY MOUTH ONCE DAILY 90 tablet 1   No facility-administered medications prior to visit.     ROS Review of Systems  Constitutional: Negative for diaphoresis and fatigue.  HENT: Negative.   Eyes: Negative for visual disturbance.  Respiratory: Negative for cough, chest tightness, shortness of breath and wheezing.   Cardiovascular: Negative for chest pain, palpitations and leg swelling.  Gastrointestinal: Negative for abdominal pain, constipation, diarrhea, nausea and vomiting.  Endocrine: Negative.   Genitourinary: Negative.  Negative for difficulty urinating and dysuria.  Musculoskeletal: Negative.  Negative for arthralgias and myalgias.  Skin: Negative for color change and pallor.  Neurological: Positive for dizziness and light-headedness. Negative for syncope, weakness and numbness.  Hematological: Negative for adenopathy. Does not bruise/bleed easily.  Psychiatric/Behavioral: Negative.     Objective:  BP 118/72 (BP Location:  Left Arm, Patient Position: Sitting, Cuff Size: Normal)    Pulse 79    Temp 98.4 F (36.9 C) (Oral)    Resp 16    Ht 5' (1.524 m)    Wt 153 lb (69.4 kg)    SpO2 95%    BMI 29.88 kg/m   BP Readings from Last 3 Encounters:  10/24/18 118/72  07/25/18 118/76  04/22/18 140/80    Wt Readings from Last 3 Encounters:  10/24/18 153 lb (69.4 kg)  07/25/18 152 lb 12.8 oz (69.3 kg)  04/22/18 155 lb 12 oz (70.6 kg)    Physical Exam Vitals signs reviewed.  Constitutional:      Appearance: She is not ill-appearing or diaphoretic.  HENT:     Nose: Nose normal.     Mouth/Throat:     Mouth: Mucous membranes are moist.     Pharynx: Oropharynx is clear.  Eyes:     General: No scleral icterus.    Conjunctiva/sclera: Conjunctivae normal.  Neck:     Musculoskeletal: Normal range of motion. No neck rigidity.  Cardiovascular:     Rate and Rhythm: Normal rate and regular rhythm.     Pulses: Normal pulses.     Heart sounds: No murmur. No gallop.   Pulmonary:     Effort: Pulmonary effort is normal.     Breath sounds: No stridor. No wheezing, rhonchi or rales.  Abdominal:     General: Bowel sounds are normal.     Palpations: There is no mass.     Tenderness: There is no abdominal tenderness. There is no guarding.  Musculoskeletal: Normal range of motion.  General: No swelling.     Right lower leg: No edema.     Left lower leg: No edema.  Lymphadenopathy:     Cervical: No cervical adenopathy.  Skin:    General: Skin is warm and dry.  Neurological:     General: No focal deficit present.  Psychiatric:        Mood and Affect: Mood normal.        Behavior: Behavior normal.     Lab Results  Component Value Date   WBC 7.6 10/24/2018   HGB 13.6 10/24/2018   HCT 39.9 10/24/2018   PLT 286.0 10/24/2018   GLUCOSE 95 10/24/2018   CHOL 120 10/24/2018   TRIG 201.0 (H) 10/24/2018   HDL 53.40 10/24/2018   LDLDIRECT 40.0 10/24/2018   LDLCALC 33 04/22/2018   ALT 22 10/24/2018   AST 26  10/24/2018   NA 135 10/24/2018   K 3.5 10/24/2018   CL 98 10/24/2018   CREATININE 1.15 10/24/2018   BUN 14 10/24/2018   CO2 25 10/24/2018   TSH 2.43 10/24/2018   HGBA1C 5.5 03/09/2016    Ct Chest Wo Contrast  Result Date: 05/17/2018 CLINICAL DATA:  Follow-up right lower lobe nodule EXAM: CT CHEST WITHOUT CONTRAST TECHNIQUE: Multidetector CT imaging of the chest was performed following the standard protocol without IV contrast. COMPARISON:  CTA of the chest from 04/09/2017 FINDINGS: Cardiovascular: Somewhat limited due to lack of IV contrast. Thoracic aorta is within normal limits. No aneurysmal dilatation or significant atherosclerotic calcifications are seen. No cardiac enlargement is noted. No significant coronary calcifications are noted. Mediastinum/Nodes: Thoracic inlet is within normal limits. No hilar or mediastinal adenopathy is noted. The esophagus is within normal limits. Lungs/Pleura: The lungs are well aerated bilaterally. Calcified granuloma is noted in the posterior aspect of the right upper lobe. In the right lower lobe laterally best seen on image number 90 of series 3 there is again noted a 6 mm nodule. The overall size is stable. Some slight increased irregularity of the borders is noted although this may be related to the thinner slice thickness. No other focal parenchymal nodules are seen. No infiltrate or sizable effusion is seen. Upper Abdomen: Visualized upper abdomen demonstrates changes of prior cholecystectomy. No other focal abnormality is seen. Musculoskeletal: Mild degenerative changes of the thoracic spine are noted. No acute abnormality is seen. IMPRESSION: Stable size of 6 mm nodule in the right lower lobe. Mild irregularity is noted of the margins of the nodule although this may be related to the thinner slice thickness. Follow-up examination in 12 months is recommended to assess for stability with thin slice sections. Changes consistent with prior granulomatous  disease. Electronically Signed   By: Inez Catalina M.D.   On: 05/17/2018 08:04    Assessment & Plan:   Vicki Martin was seen today for hypertension and hyperlipidemia.  Diagnoses and all orders for this visit:  Essential hypertension- Her blood pressure is over controlled and she is symptomatic.  I have asked her to stop taking the combination of an ARB and thiazide diuretic.  She will continue to monitor her blood pressure and if it rises above 140/90 then I will recommend restarting an ARB but not the thiazide diuretic.  I have asked her to stay on the current dose of carvedilol. -     CBC with Differential/Platelet; Future -     Comprehensive metabolic panel; Future -     TSH; Future  Gastroesophageal reflux disease with esophagitis- Her symptoms are  well controlled. -     CBC with Differential/Platelet; Future  Pure hypercholesterolemia- She has achieved her LDL goal and is doing well on the statin. -     Lipid panel; Future  Hypertriglyceridemia- Her triglycerides are very mildly elevated but do not warrant pharmacological intervention.  She will continue to work on her lifestyle modifications. -     Lipid panel; Future  Need for hepatitis C screening test -     Hepatitis C antibody; Future   I have discontinued Vicki Martin's losartan-hydrochlorothiazide. I am also having her maintain her DULoxetine, omeprazole, atorvastatin, gabapentin, and carvedilol.  No orders of the defined types were placed in this encounter.    Follow-up: Return in about 6 months (around 04/26/2019).  Scarlette Calico, MD

## 2018-10-25 LAB — COMPREHENSIVE METABOLIC PANEL
ALT: 22 U/L (ref 0–35)
AST: 26 U/L (ref 0–37)
Albumin: 4.1 g/dL (ref 3.5–5.2)
Alkaline Phosphatase: 117 U/L (ref 39–117)
BUN: 14 mg/dL (ref 6–23)
CO2: 25 mEq/L (ref 19–32)
Calcium: 9 mg/dL (ref 8.4–10.5)
Chloride: 98 mEq/L (ref 96–112)
Creatinine, Ser: 1.15 mg/dL (ref 0.40–1.20)
GFR: 46.9 mL/min — ABNORMAL LOW (ref >60.00)
Glucose, Bld: 95 mg/dL (ref 70–99)
Potassium: 3.5 mEq/L (ref 3.5–5.1)
Sodium: 135 mEq/L (ref 135–145)
Total Bilirubin: 0.6 mg/dL (ref 0.2–1.2)
Total Protein: 6.7 g/dL (ref 6.0–8.3)

## 2018-10-25 LAB — LIPID PANEL
Cholesterol: 120 mg/dL (ref 0–200)
HDL: 53.4 mg/dL (ref >39.00)
NonHDL: 66.44
Total CHOL/HDL Ratio: 2
Triglycerides: 201 mg/dL — ABNORMAL HIGH (ref 0.0–149.0)
VLDL: 40.2 mg/dL — ABNORMAL HIGH (ref 0.0–40.0)

## 2018-10-25 LAB — HEPATITIS C ANTIBODY
Hepatitis C Ab: NONREACTIVE
SIGNAL TO CUT-OFF: 0.01 (ref <1.00)

## 2018-10-25 LAB — TSH: TSH: 2.43 u[IU]/mL (ref 0.35–4.50)

## 2018-10-25 LAB — LDL CHOLESTEROL, DIRECT: Direct LDL: 40 mg/dL

## 2018-10-28 ENCOUNTER — Encounter: Payer: Self-pay | Admitting: Internal Medicine

## 2018-11-01 ENCOUNTER — Other Ambulatory Visit: Payer: Self-pay | Admitting: Internal Medicine

## 2018-11-01 DIAGNOSIS — F450 Somatization disorder: Secondary | ICD-10-CM

## 2018-11-01 DIAGNOSIS — F329 Major depressive disorder, single episode, unspecified: Secondary | ICD-10-CM

## 2018-12-02 ENCOUNTER — Other Ambulatory Visit: Payer: Self-pay | Admitting: Internal Medicine

## 2018-12-02 DIAGNOSIS — K2100 Gastro-esophageal reflux disease with esophagitis, without bleeding: Secondary | ICD-10-CM

## 2018-12-03 DIAGNOSIS — H9202 Otalgia, left ear: Secondary | ICD-10-CM | POA: Diagnosis not present

## 2018-12-03 DIAGNOSIS — R6884 Jaw pain: Secondary | ICD-10-CM | POA: Diagnosis not present

## 2019-01-28 ENCOUNTER — Other Ambulatory Visit: Payer: Self-pay | Admitting: Internal Medicine

## 2019-01-28 DIAGNOSIS — I10 Essential (primary) hypertension: Secondary | ICD-10-CM

## 2019-03-05 ENCOUNTER — Other Ambulatory Visit: Payer: Self-pay | Admitting: Internal Medicine

## 2019-03-05 DIAGNOSIS — K2100 Gastro-esophageal reflux disease with esophagitis, without bleeding: Secondary | ICD-10-CM

## 2019-03-22 ENCOUNTER — Other Ambulatory Visit: Payer: Self-pay | Admitting: Internal Medicine

## 2019-04-23 ENCOUNTER — Other Ambulatory Visit: Payer: Self-pay

## 2019-05-11 ENCOUNTER — Other Ambulatory Visit: Payer: Self-pay | Admitting: Internal Medicine

## 2019-05-11 DIAGNOSIS — F32A Depression, unspecified: Secondary | ICD-10-CM

## 2019-05-11 DIAGNOSIS — F329 Major depressive disorder, single episode, unspecified: Secondary | ICD-10-CM

## 2019-05-11 DIAGNOSIS — I10 Essential (primary) hypertension: Secondary | ICD-10-CM

## 2019-05-14 ENCOUNTER — Other Ambulatory Visit: Payer: Self-pay

## 2019-05-14 ENCOUNTER — Ambulatory Visit (INDEPENDENT_AMBULATORY_CARE_PROVIDER_SITE_OTHER): Payer: Medicare Other

## 2019-05-14 DIAGNOSIS — Z23 Encounter for immunization: Secondary | ICD-10-CM | POA: Diagnosis not present

## 2019-07-15 ENCOUNTER — Encounter: Payer: Self-pay | Admitting: Internal Medicine

## 2019-07-15 ENCOUNTER — Other Ambulatory Visit: Payer: Self-pay

## 2019-07-15 ENCOUNTER — Ambulatory Visit (INDEPENDENT_AMBULATORY_CARE_PROVIDER_SITE_OTHER): Payer: Medicare Other | Admitting: Internal Medicine

## 2019-07-15 VITALS — BP 172/94 | HR 69 | Temp 98.8°F | Resp 16 | Ht 60.0 in | Wt 141.4 lb

## 2019-07-15 DIAGNOSIS — I10 Essential (primary) hypertension: Secondary | ICD-10-CM | POA: Diagnosis not present

## 2019-07-15 DIAGNOSIS — F4322 Adjustment disorder with anxiety: Secondary | ICD-10-CM | POA: Diagnosis not present

## 2019-07-15 MED ORDER — CLONAZEPAM 1 MG PO TABS: 1.0000 mg | ORAL_TABLET | Freq: Two times a day (BID) | ORAL | 1 refills | Status: DC | PRN

## 2019-07-15 MED ORDER — INDAPAMIDE 1.25 MG PO TABS: 1.2500 mg | ORAL_TABLET | Freq: Every day | ORAL | 0 refills | Status: DC

## 2019-07-15 NOTE — Progress Notes (Signed)
Subjective:  Patient ID: Vicki Martin, female    DOB: November 22, 1950  Age: 69 y.o. MRN: [id number]  CC: Hypertension   This visit occurred during the SARS-CoV-2 public health emergency.  Safety protocols were in place, including screening questions prior to the visit, additional usage of staff PPE, and extensive cleaning of exam room while observing appropriate contact time as indicated for disinfecting solutions.   HPI Vicki Martin presents for f/up - Her husband recently died after a long battle with cancer.  She is struggling with the emotions related to this.  She complains of insomnia, anxiety, and feeling overwhelmed.  She also tells me her blood pressure has not been well controlled.  She thinks this is because she is not sleeping well.  She complains of mild, intermittent headaches.  She has had some nausea but denies vomiting, blurred vision, paresthesias, chest pain, or shortness of breath.  Outpatient Medications Prior to Visit  Medication Sig Dispense Refill  . atorvastatin (LIPITOR) 40 MG tablet Take 1 tablet by mouth once daily 90 tablet 1  . carvedilol (COREG) 6.25 MG tablet Take 1 tablet by mouth twice daily 180 tablet 0  . DULoxetine (CYMBALTA) 60 MG capsule Take 1 capsule by mouth once daily 90 capsule 0  . gabapentin (NEURONTIN) 300 MG capsule Take 1 capsule by mouth at bedtime 90 capsule 3  . omeprazole (PRILOSEC) 40 MG capsule Take 1 capsule by mouth once daily 90 capsule 1   No facility-administered medications prior to visit.    ROS Review of Systems  Constitutional: Positive for appetite change and unexpected weight change (wt loss). Negative for diaphoresis and fatigue.  HENT: Negative.   Eyes: Negative.   Respiratory: Negative for cough, chest tightness, shortness of breath and wheezing.   Gastrointestinal: Positive for nausea. Negative for abdominal pain, constipation, diarrhea and vomiting.  Endocrine: Negative.   Genitourinary: Negative.  Negative for  difficulty urinating.  Musculoskeletal: Negative for arthralgias, myalgias and neck pain.  Skin: Negative.   Neurological: Positive for headaches. Negative for dizziness, weakness, light-headedness and numbness.  Hematological: Negative for adenopathy. Does not bruise/bleed easily.  Psychiatric/Behavioral: Positive for dysphoric mood and sleep disturbance. Negative for agitation, behavioral problems, confusion, decreased concentration, hallucinations, self-injury and suicidal ideas. The patient is nervous/anxious.     Objective:  BP (!) 172/94 (BP Location: Left Arm, Patient Position: Sitting, Cuff Size: Normal)   Pulse 69   Temp 98.8 F (37.1 C) (Oral)   Resp 16   Ht 5' (1.524 m)   Wt 141 lb 6 oz (64.1 kg)   SpO2 95%   BMI 27.61 kg/m   BP Readings from Last 3 Encounters:  07/15/19 (!) 172/94  10/24/18 118/72  07/25/18 118/76    Wt Readings from Last 3 Encounters:  07/15/19 141 lb 6 oz (64.1 kg)  10/24/18 153 lb (69.4 kg)  07/25/18 152 lb 12.8 oz (69.3 kg)    Physical Exam Vitals reviewed.  Constitutional:      General: She is not in acute distress.    Appearance: Normal appearance. She is not ill-appearing, toxic-appearing or diaphoretic.  HENT:     Nose: Nose normal.     Mouth/Throat:     Mouth: Mucous membranes are moist.  Eyes:     General: No scleral icterus.    Extraocular Movements: Extraocular movements intact.     Conjunctiva/sclera: Conjunctivae normal.     Pupils: Pupils are equal, round, and reactive to light.  Cardiovascular:  Rate and Rhythm: Normal rate and regular rhythm.     Heart sounds: No murmur.     Comments: EKG - NSR No LVH LBBB - no significant change compared to the prior EKG Pulmonary:     Effort: Pulmonary effort is normal.     Breath sounds: No stridor. No wheezing, rhonchi or rales.  Abdominal:     General: Abdomen is flat. Bowel sounds are normal. There is no distension.     Palpations: Abdomen is soft. There is no  hepatomegaly or splenomegaly.     Tenderness: There is no abdominal tenderness.  Musculoskeletal:     Cervical back: Neck supple.     Right lower leg: No edema.     Left lower leg: No edema.  Lymphadenopathy:     Cervical: No cervical adenopathy.  Skin:    General: Skin is warm and dry.     Findings: No rash.  Neurological:     General: No focal deficit present.     Mental Status: She is alert and oriented to person, place, and time. Mental status is at baseline.     Motor: No weakness.     Coordination: Coordination normal.     Gait: Gait normal.     Deep Tendon Reflexes: Reflexes normal.  Psychiatric:        Attention and Perception: Attention and perception normal.        Mood and Affect: Mood is anxious and depressed. Affect is tearful. Affect is not labile, flat, angry or inappropriate.        Speech: Speech normal.        Behavior: Behavior is not agitated, slowed, aggressive or withdrawn. Behavior is cooperative.        Thought Content: Thought content normal. Thought content is not paranoid or delusional. Thought content does not include homicidal or suicidal ideation.        Cognition and Memory: Cognition normal.        Judgment: Judgment normal.     Lab Results  Component Value Date   WBC 7.6 10/24/2018   HGB 13.6 10/24/2018   HCT 39.9 10/24/2018   PLT 286.0 10/24/2018   GLUCOSE 95 10/24/2018   CHOL 120 10/24/2018   TRIG 201.0 (H) 10/24/2018   HDL 53.40 10/24/2018   LDLDIRECT 40.0 10/24/2018   LDLCALC 33 04/22/2018   ALT 22 10/24/2018   AST 26 10/24/2018   NA 135 10/24/2018   K 3.5 10/24/2018   CL 98 10/24/2018   CREATININE 1.15 10/24/2018   BUN 14 10/24/2018   CO2 25 10/24/2018   TSH 2.43 10/24/2018   HGBA1C 5.5 03/09/2016    CT Chest Wo Contrast  Result Date: 05/17/2018 CLINICAL DATA:  Follow-up right lower lobe nodule EXAM: CT CHEST WITHOUT CONTRAST TECHNIQUE: Multidetector CT imaging of the chest was performed following the standard protocol  without IV contrast. COMPARISON:  CTA of the chest from 04/09/2017 FINDINGS: Cardiovascular: Somewhat limited due to lack of IV contrast. Thoracic aorta is within normal limits. No aneurysmal dilatation or significant atherosclerotic calcifications are seen. No cardiac enlargement is noted. No significant coronary calcifications are noted. Mediastinum/Nodes: Thoracic inlet is within normal limits. No hilar or mediastinal adenopathy is noted. The esophagus is within normal limits. Lungs/Pleura: The lungs are well aerated bilaterally. Calcified granuloma is noted in the posterior aspect of the right upper lobe. In the right lower lobe laterally best seen on image number 90 of series 3 there is again noted a 6 mm nodule.  The overall size is stable. Some slight increased irregularity of the borders is noted although this may be related to the thinner slice thickness. No other focal parenchymal nodules are seen. No infiltrate or sizable effusion is seen. Upper Abdomen: Visualized upper abdomen demonstrates changes of prior cholecystectomy. No other focal abnormality is seen. Musculoskeletal: Mild degenerative changes of the thoracic spine are noted. No acute abnormality is seen. IMPRESSION: Stable size of 6 mm nodule in the right lower lobe. Mild irregularity is noted of the margins of the nodule although this may be related to the thinner slice thickness. Follow-up examination in 12 months is recommended to assess for stability with thin slice sections. Changes consistent with prior granulomatous disease. Electronically Signed   By: Inez Catalina M.D.   On: 05/17/2018 08:04    Assessment & Plan:   Vicki Martin was seen today for hypertension.  Diagnoses and all orders for this visit:  Essential hypertension- Her blood pressure is not adequately well controlled and she is symptomatic.  I have asked her to add indapamide to the carvedilol.  She will let me know if she develops any new or worsening symptoms. -      indapamide (LOZOL) 1.25 MG tablet; Take 1 tablet (1.25 mg total) by mouth daily.  Adjustment reaction with anxiety- She was not willing to consider starting an antidepressant.  Will try a short course of a benzodiazepine.  She will let me know if she develops any new or worsening symptoms. -     clonazePAM (KLONOPIN) 1 MG tablet; Take 1 tablet (1 mg total) by mouth 2 (two) times daily as needed for anxiety.   I am having Vicki Martin start on indapamide and clonazePAM. I am also having her maintain her gabapentin, omeprazole, atorvastatin, DULoxetine, and carvedilol.  Meds ordered this encounter  Medications  . indapamide (LOZOL) 1.25 MG tablet    Sig: Take 1 tablet (1.25 mg total) by mouth daily.    Dispense:  90 tablet    Refill:  0  . clonazePAM (KLONOPIN) 1 MG tablet    Sig: Take 1 tablet (1 mg total) by mouth 2 (two) times daily as needed for anxiety.    Dispense:  60 tablet    Refill:  1     Follow-up: Return in about 6 weeks (around 08/26/2019).  Scarlette Calico, MD

## 2019-07-15 NOTE — Patient Instructions (Signed)

## 2019-08-26 ENCOUNTER — Ambulatory Visit (INDEPENDENT_AMBULATORY_CARE_PROVIDER_SITE_OTHER): Payer: Medicare Other | Admitting: Internal Medicine

## 2019-08-26 ENCOUNTER — Other Ambulatory Visit: Payer: Self-pay

## 2019-08-26 ENCOUNTER — Encounter: Payer: Self-pay | Admitting: Internal Medicine

## 2019-08-26 VITALS — BP 126/76 | HR 63 | Temp 98.3°F | Resp 16 | Ht 60.0 in | Wt 141.0 lb

## 2019-08-26 DIAGNOSIS — R911 Solitary pulmonary nodule: Secondary | ICD-10-CM

## 2019-08-26 DIAGNOSIS — I10 Essential (primary) hypertension: Secondary | ICD-10-CM

## 2019-08-26 LAB — BASIC METABOLIC PANEL
BUN: 12 mg/dL (ref 6–23)
CO2: 32 mEq/L (ref 19–32)
Calcium: 9.3 mg/dL (ref 8.4–10.5)
Chloride: 99 mEq/L (ref 96–112)
Creatinine, Ser: 0.85 mg/dL (ref 0.40–1.20)
GFR: 66.31 mL/min (ref >60.00)
Glucose, Bld: 101 mg/dL — ABNORMAL HIGH (ref 70–99)
Potassium: 3.6 mEq/L (ref 3.5–5.1)
Sodium: 138 mEq/L (ref 135–145)

## 2019-08-26 MED ORDER — CARVEDILOL 6.25 MG PO TABS: 6.2500 mg | ORAL_TABLET | Freq: Two times a day (BID) | ORAL | 0 refills | Status: DC

## 2019-08-26 NOTE — Progress Notes (Signed)
Subjective:  Patient ID: Vicki Martin, female    DOB: 10/14/1950  Age: 69 y.o. MRN: [id number]  CC: Hypertension  This visit occurred during the SARS-CoV-2 public health emergency.  Safety protocols were in place, including screening questions prior to the visit, additional usage of staff PPE, and extensive cleaning of exam room while observing appropriate contact time as indicated for disinfecting solutions.   HPI Vicki Martin presents for f/up - She tells me her blood pressure has been well controlled.  She is active and denies any recent episodes of chest pain, shortness of breath, dizziness, lightheadedness, palpitations, edema, or fatigue.  Outpatient Medications Prior to Visit  Medication Sig Dispense Refill  . atorvastatin (LIPITOR) 40 MG tablet Take 1 tablet by mouth once daily 90 tablet 1  . clonazePAM (KLONOPIN) 1 MG tablet Take 1 tablet (1 mg total) by mouth 2 (two) times daily as needed for anxiety. 60 tablet 1  . DULoxetine (CYMBALTA) 60 MG capsule Take 1 capsule by mouth once daily 90 capsule 0  . gabapentin (NEURONTIN) 300 MG capsule Take 1 capsule by mouth at bedtime 90 capsule 3  . indapamide (LOZOL) 1.25 MG tablet Take 1 tablet (1.25 mg total) by mouth daily. 90 tablet 0  . omeprazole (PRILOSEC) 40 MG capsule Take 1 capsule by mouth once daily 90 capsule 1  . carvedilol (COREG) 6.25 MG tablet Take 1 tablet by mouth twice daily 180 tablet 0   No facility-administered medications prior to visit.    ROS Review of Systems  Constitutional: Negative for appetite change, diaphoresis, fatigue and unexpected weight change.  HENT: Negative.   Eyes: Negative for visual disturbance.  Respiratory: Negative for cough, chest tightness, shortness of breath and wheezing.   Cardiovascular: Negative for chest pain, palpitations and leg swelling.  Gastrointestinal: Negative for abdominal pain, constipation, diarrhea, nausea and vomiting.  Endocrine: Negative.   Genitourinary:  Negative.   Musculoskeletal: Negative.  Negative for arthralgias and myalgias.  Skin: Negative for color change and rash.  Neurological: Negative.  Negative for dizziness, weakness, light-headedness and headaches.  Hematological: Negative for adenopathy. Does not bruise/bleed easily.  Psychiatric/Behavioral: Negative.     Objective:  BP 126/76 (BP Location: Left Arm, Patient Position: Sitting, Cuff Size: Normal)   Pulse 63   Temp 98.3 F (36.8 C) (Oral)   Resp 16   Ht 5' (1.524 m)   Wt 141 lb (64 kg)   SpO2 96%   BMI 27.54 kg/m   BP Readings from Last 3 Encounters:  08/26/19 126/76  07/15/19 (!) 172/94  10/24/18 118/72    Wt Readings from Last 3 Encounters:  08/26/19 141 lb (64 kg)  07/15/19 141 lb 6 oz (64.1 kg)  10/24/18 153 lb (69.4 kg)    Physical Exam Vitals reviewed.  Constitutional:      Appearance: Normal appearance.  HENT:     Nose: Nose normal.     Mouth/Throat:     Mouth: Mucous membranes are moist.  Eyes:     General: No scleral icterus.    Conjunctiva/sclera: Conjunctivae normal.  Cardiovascular:     Rate and Rhythm: Normal rate and regular rhythm.     Heart sounds: No murmur.  Pulmonary:     Effort: Pulmonary effort is normal.     Breath sounds: No stridor. No wheezing or rhonchi.  Abdominal:     General: Abdomen is flat. Bowel sounds are normal. There is no distension.     Palpations: Abdomen is soft. There  is no hepatomegaly, splenomegaly or mass.     Tenderness: There is no abdominal tenderness.  Musculoskeletal:        General: Normal range of motion.     Cervical back: Neck supple.     Right lower leg: No edema.     Left lower leg: No edema.  Lymphadenopathy:     Cervical: No cervical adenopathy.  Skin:    General: Skin is warm and dry.  Neurological:     General: No focal deficit present.     Mental Status: She is alert.  Psychiatric:        Mood and Affect: Mood normal.        Behavior: Behavior normal.     Lab Results    Component Value Date   WBC 7.6 10/24/2018   HGB 13.6 10/24/2018   HCT 39.9 10/24/2018   PLT 286.0 10/24/2018   GLUCOSE 101 (H) 08/26/2019   CHOL 120 10/24/2018   TRIG 201.0 (H) 10/24/2018   HDL 53.40 10/24/2018   LDLDIRECT 40.0 10/24/2018   LDLCALC 33 04/22/2018   ALT 22 10/24/2018   AST 26 10/24/2018   NA 138 08/26/2019   K 3.6 08/26/2019   CL 99 08/26/2019   CREATININE 0.85 08/26/2019   BUN 12 08/26/2019   CO2 32 08/26/2019   TSH 2.43 10/24/2018   HGBA1C 5.5 03/09/2016    CT Chest Wo Contrast  Result Date: 05/17/2018 CLINICAL DATA:  Follow-up right lower lobe nodule EXAM: CT CHEST WITHOUT CONTRAST TECHNIQUE: Multidetector CT imaging of the chest was performed following the standard protocol without IV contrast. COMPARISON:  CTA of the chest from 04/09/2017 FINDINGS: Cardiovascular: Somewhat limited due to lack of IV contrast. Thoracic aorta is within normal limits. No aneurysmal dilatation or significant atherosclerotic calcifications are seen. No cardiac enlargement is noted. No significant coronary calcifications are noted. Mediastinum/Nodes: Thoracic inlet is within normal limits. No hilar or mediastinal adenopathy is noted. The esophagus is within normal limits. Lungs/Pleura: The lungs are well aerated bilaterally. Calcified granuloma is noted in the posterior aspect of the right upper lobe. In the right lower lobe laterally best seen on image number 90 of series 3 there is again noted a 6 mm nodule. The overall size is stable. Some slight increased irregularity of the borders is noted although this may be related to the thinner slice thickness. No other focal parenchymal nodules are seen. No infiltrate or sizable effusion is seen. Upper Abdomen: Visualized upper abdomen demonstrates changes of prior cholecystectomy. No other focal abnormality is seen. Musculoskeletal: Mild degenerative changes of the thoracic spine are noted. No acute abnormality is seen. IMPRESSION: Stable size of  6 mm nodule in the right lower lobe. Mild irregularity is noted of the margins of the nodule although this may be related to the thinner slice thickness. Follow-up examination in 12 months is recommended to assess for stability with thin slice sections. Changes consistent with prior granulomatous disease. Electronically Signed   By: Inez Catalina M.D.   On: 05/17/2018 08:04    Assessment & Plan:   Catheline was seen today for hypertension.  Diagnoses and all orders for this visit:  Nodule of middle lobe of right lung- She is due for a repeat CT scan to check the stability of this. -     CT Chest Wo Contrast; Future  Essential hypertension- Her blood pressure is adequately well controlled.  Electrolytes and renal function are normal.  Will continue the current medications. -  carvedilol (COREG) 6.25 MG tablet; Take 1 tablet (6.25 mg total) by mouth 2 (two) times daily. -     Basic metabolic panel; Future -     Basic metabolic panel   I have changed Samyria S. Haggard's carvedilol. I am also having her maintain her gabapentin, omeprazole, atorvastatin, DULoxetine, indapamide, and clonazePAM.  Meds ordered this encounter  Medications  . carvedilol (COREG) 6.25 MG tablet    Sig: Take 1 tablet (6.25 mg total) by mouth 2 (two) times daily.    Dispense:  180 tablet    Refill:  0     Follow-up: Return in about 6 months (around 02/26/2020).  Scarlette Calico, MD

## 2019-08-26 NOTE — Patient Instructions (Signed)

## 2019-08-27 ENCOUNTER — Encounter: Payer: Self-pay | Admitting: Internal Medicine

## 2019-09-08 ENCOUNTER — Other Ambulatory Visit: Payer: Self-pay | Admitting: Internal Medicine

## 2019-09-08 DIAGNOSIS — F329 Major depressive disorder, single episode, unspecified: Secondary | ICD-10-CM

## 2019-09-08 DIAGNOSIS — F32A Depression, unspecified: Secondary | ICD-10-CM

## 2019-09-12 ENCOUNTER — Emergency Department (HOSPITAL_COMMUNITY)
Admission: EM | Admit: 2019-09-12 | Discharge: 2019-09-13 | Disposition: A | Discharge status: Home / Self Care | Payer: Medicare Other | Attending: Emergency Medicine | Admitting: Emergency Medicine

## 2019-09-12 ENCOUNTER — Ambulatory Visit (HOSPITAL_COMMUNITY)
Admission: EM | Admit: 2019-09-12 | Discharge: 2019-09-12 | Disposition: A | Discharge status: Other Acute Inpatient Hospital | Payer: Medicare Other | Source: Home / Self Care

## 2019-09-12 ENCOUNTER — Other Ambulatory Visit: Payer: Self-pay

## 2019-09-12 ENCOUNTER — Encounter (HOSPITAL_COMMUNITY): Payer: Self-pay | Admitting: Emergency Medicine

## 2019-09-12 DIAGNOSIS — R4182 Altered mental status, unspecified: Secondary | ICD-10-CM | POA: Insufficient documentation

## 2019-09-12 DIAGNOSIS — Z79899 Other long term (current) drug therapy: Secondary | ICD-10-CM | POA: Insufficient documentation

## 2019-09-12 DIAGNOSIS — R42 Dizziness and giddiness: Secondary | ICD-10-CM | POA: Insufficient documentation

## 2019-09-12 DIAGNOSIS — R410 Disorientation, unspecified: Secondary | ICD-10-CM

## 2019-09-12 DIAGNOSIS — I10 Essential (primary) hypertension: Secondary | ICD-10-CM | POA: Diagnosis not present

## 2019-09-12 DIAGNOSIS — E876 Hypokalemia: Secondary | ICD-10-CM

## 2019-09-12 DIAGNOSIS — R4020 Unspecified coma: Secondary | ICD-10-CM | POA: Diagnosis not present

## 2019-09-12 LAB — CBC
HCT: 40.9 % (ref 36.0–46.0)
Hemoglobin: 13.7 g/dL (ref 12.0–15.0)
MCH: 30 pg (ref 26.0–34.0)
MCHC: 33.5 g/dL (ref 30.0–36.0)
MCV: 89.7 fL (ref 80.0–100.0)
Platelets: 302 10*3/uL (ref 150–400)
RBC: 4.56 MIL/uL (ref 3.87–5.11)
RDW: 13.3 % (ref 11.5–15.5)
WBC: 9.5 10*3/uL (ref 4.0–10.5)
nRBC: 0 % (ref 0.0–0.2)

## 2019-09-12 LAB — COMPREHENSIVE METABOLIC PANEL
ALT: 29 U/L (ref 0–44)
AST: 33 U/L (ref 15–41)
Albumin: 3.9 g/dL (ref 3.5–5.0)
Alkaline Phosphatase: 118 U/L (ref 38–126)
Anion gap: 11 (ref 5–15)
BUN: 13 mg/dL (ref 8–23)
CO2: 26 mmol/L (ref 22–32)
Calcium: 9.2 mg/dL (ref 8.9–10.3)
Chloride: 99 mmol/L (ref 98–111)
Creatinine, Ser: 0.97 mg/dL (ref 0.44–1.00)
GFR calc Af Amer: >60 mL/min (ref >60)
GFR calc non Af Amer: 60 mL/min — ABNORMAL LOW (ref >60)
Glucose, Bld: 98 mg/dL (ref 70–99)
Potassium: 3.3 mmol/L — ABNORMAL LOW (ref 3.5–5.1)
Sodium: 136 mmol/L (ref 135–145)
Total Bilirubin: 1.1 mg/dL (ref 0.3–1.2)
Total Protein: 6.5 g/dL (ref 6.5–8.1)

## 2019-09-12 MED ORDER — SODIUM CHLORIDE 0.9% FLUSH: 3.0000 mL | Freq: Once | INTRAVENOUS | Status: DC

## 2019-09-12 NOTE — ED Triage Notes (Signed)
Pt brought to ED by daughter for AMS, per daughter pt is not able to remember anything that happen from yesterday up to today. Pt states she believes that she just work on the sun to long. Pt is AO x 4 on triage no neuro deficit noticed. LKW per daughter yesterday 09/11/2019 at 12:30 p,m.

## 2019-09-12 NOTE — ED Triage Notes (Addendum)
Per daughter, her fiance said pt came home yesterday from the store confused, didn't know where she went per daughter. Pt's mirror was hanging on her car and she doesn't recall hitting anything. Prior to pt going to store, daughter's fiance states pt was acting her normal self. LKW 09/11/2019 1230. Per daughter pt woke up this morning and stated "where did this weed eater come from in my yard?' Pt bought it at [facility] yesterday. Pt is A&Ox4.

## 2019-09-12 NOTE — ED Notes (Signed)
Spoke w dr. Orie Rout and he recommended pt go to ED. PT's daughter and son-in-law are taking pt to ED.

## 2019-09-13 ENCOUNTER — Emergency Department (HOSPITAL_COMMUNITY): Payer: Medicare Other

## 2019-09-13 DIAGNOSIS — R4020 Unspecified coma: Secondary | ICD-10-CM | POA: Diagnosis not present

## 2019-09-13 LAB — URINALYSIS, ROUTINE W REFLEX MICROSCOPIC
Bacteria, UA: NONE SEEN
Bilirubin Urine: NEGATIVE
Glucose, UA: NEGATIVE mg/dL
Ketones, ur: NEGATIVE mg/dL
Nitrite: NEGATIVE
Protein, ur: NEGATIVE mg/dL
Specific Gravity, Urine: 1.004 — ABNORMAL LOW (ref 1.005–1.030)
pH: 7 (ref 5.0–8.0)

## 2019-09-13 LAB — RAPID URINE DRUG SCREEN, HOSP PERFORMED
Amphetamines: NOT DETECTED
Barbiturates: NOT DETECTED
Benzodiazepines: NOT DETECTED
Cocaine: NOT DETECTED
Opiates: NOT DETECTED
Tetrahydrocannabinol: NOT DETECTED

## 2019-09-13 MED ORDER — SODIUM CHLORIDE 0.9 % IV BOLUS
500.0000 mL | Freq: Once | INTRAVENOUS | Status: AC
  Administered 2019-09-13: 04:00:00 500 mL via INTRAVENOUS

## 2019-09-13 MED ORDER — POTASSIUM CHLORIDE CRYS ER 20 MEQ PO TBCR
40.0000 meq | EXTENDED_RELEASE_TABLET | Freq: Once | ORAL | Status: AC
  Administered 2019-09-13: 04:00:00 40 meq via ORAL
  Filled 2019-09-13: qty 2

## 2019-09-13 NOTE — ED Provider Notes (Signed)
[facility] EMERGENCY DEPARTMENT Provider Note   CSN: [id number] Arrival date & time: 09/12/19  1846     History Chief Complaint  Patient presents with  . Altered Mental Status    Vicki Martin is a 69 y.o. female with history of GERD, hypertension, hyperlipidemia, migraine headaches presents brought in by daughter for evaluation of acute onset, persistent memory difficulties.  The patient lives alone.  On Thursday she went to [facility] to pick up a weed eater.  Upon returning home and coming out of her vehicle she reportedly became lightheaded and almost fell to the ground but was caught by patient's daughter's fianc.  He then assisted her into her bed and gave her orange juice.  He recommended evaluation at that time but she refused and he went home.  Friday morning the patient's daughter received a phone call from the patient stating that she awoke not knowing where she was though she was in her own home.  She was also confused as to where the weedeater she had but the day before at [facility] came from.  She does not remember going to [facility].  She does note that she has been experiencing a throbbing frontal headache every other day for the last 3 weeks.  She reports that it is a little bit different from her usual migraine headaches.  She has chronic and unchanged vision changes for several months due to cataracts.  She denies numbness or weakness of the extremities, chest pain, shortness of breath, abdominal pain, nausea, or vomiting.  She states that ever since her husband died in 08/15/22 she has had decreased appetite, decreased oral intake, has had less interest in her activities, difficulty sleeping.  She has been talking with her PCP who has recommended counseling.  In 08/15/2022 he did prescribe her indapamide which she has been taking and Klonopin which she has not been taking as she does not like it side effects.   The history is provided by the patient.       Past  Medical History:  Diagnosis Date  . Anxiety   . Arthritis   . Depression   . Family history of pancreatic cancer   . Family history of stomach cancer   . GERD (gastroesophageal reflux disease)   . Heart murmur   . Hyperlipidemia    no medications  . Hypertension   . Migraines   . Sleep apnea    pt declines CPAP    Patient Active Problem List   Diagnosis Date Noted  . Adjustment reaction with anxiety 07/15/2019  . 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 Restless legs syndrome (RLS) 12/11/2016  . Depression with somatization 12/11/2016  . Hypertriglyceridemia 08/05/2015  . Routine general medical examination at a health care facility 08/04/2015  . Gastroesophageal reflux disease with esophagitis 08/04/2015  . Systolic murmur [id number]  . Hyperlipidemia 08/04/2015  . Left bundle branch block (LBBB) 08/04/2015  . Essential hypertension 07/26/2006  . Osteoarthritis 07/26/2006    Past Surgical History:  Procedure Laterality Date  . ABDOMINAL HYSTERECTOMY     reports complet but with ovaries retained  . CHOLECYSTECTOMY    . removed hip bone to replace vertebrae       OB History   No obstetric history on file.     Family History  Problem Relation Age of Onset  . Rheum arthritis Mother   . Stomach cancer Mother 72  . Pancreatic cancer Mother 32  . Stomach cancer  Maternal Aunt   . Pancreatic cancer Maternal Aunt   . Heart disease Father   . Hypertension Sister   . Lung cancer Brother        deceased  . Early death Brother   . Liver cancer Brother   . Hypertension Brother   . COPD Brother   . Cancer Maternal Uncle        NOS  . Cancer Maternal Aunt        NOS  . Lung cancer Other   . Ovarian cysts Daughter   . Diabetes Neg Hx   . Alcohol abuse Neg Hx   . Stroke Neg Hx   . Colon cancer Neg Hx   . Esophageal cancer Neg Hx   . Rectal cancer Neg Hx     Social History   Tobacco Use  . Smoking status: Never Smoker  . Smokeless tobacco: Never  Used  Substance Use Topics  . Alcohol use: No    Alcohol/week: 0.0 standard drinks  . Drug use: No    Home Medications Prior to Admission medications   Medication Sig Start Date End Date Taking? Authorizing Provider  atorvastatin (LIPITOR) 40 MG tablet Take 1 tablet by mouth once daily 03/22/19   Janith Lima, MD  carvedilol (COREG) 6.25 MG tablet Take 1 tablet (6.25 mg total) by mouth 2 (two) times daily. 08/26/19   Janith Lima, MD  clonazePAM (KLONOPIN) 1 MG tablet Take 1 tablet (1 mg total) by mouth 2 (two) times daily as needed for anxiety. 07/15/19   Janith Lima, MD  DULoxetine (CYMBALTA) 60 MG capsule Take 1 capsule by mouth once daily 09/08/19   Janith Lima, MD  gabapentin (NEURONTIN) 300 MG capsule Take 1 capsule by mouth at bedtime 09/17/18   Janith Lima, MD  indapamide (LOZOL) 1.25 MG tablet Take 1 tablet (1.25 mg total) by mouth daily. 07/15/19   Janith Lima, MD  omeprazole (PRILOSEC) 40 MG capsule Take 1 capsule by mouth once daily 03/05/19   Janith Lima, MD  DULoxetine (CYMBALTA) 60 MG capsule Take 1 capsule by mouth once daily 05/12/19   Janith Lima, MD    Allergies    Codeine and Penicillins  Review of Systems   Review of Systems  Constitutional: Negative for chills and fever.  Eyes: Positive for visual disturbance (Chronic due to cataracts, unchanged).  Respiratory: Negative for shortness of breath.   Cardiovascular: Negative for chest pain.  Gastrointestinal: Negative for abdominal pain, nausea and vomiting.  Genitourinary: Negative for dysuria, frequency, hematuria and urgency.  Neurological: Positive for headaches. Negative for weakness and numbness.  Psychiatric/Behavioral: Positive for confusion, decreased concentration, dysphoric mood and sleep disturbance.  All other systems reviewed and are negative.   Physical Exam Updated Vital Signs BP 120/61 (BP Location: Right Arm)   Pulse (!) 59   Temp 97.9 F (36.6 C) (Oral)   Resp 16    Ht 5' (1.524 m)   Wt 62.1 kg   SpO2 96%   BMI 26.76 kg/m   Physical Exam Vitals and nursing note reviewed.  Constitutional:      General: She is not in acute distress.    Appearance: She is well-developed.  HENT:     Head: Normocephalic and atraumatic.  Eyes:     General:        Right eye: No discharge.        Left eye: No discharge.     Conjunctiva/sclera: Conjunctivae normal.  Neck:     Vascular: No JVD.     Trachea: No tracheal deviation.  Cardiovascular:     Rate and Rhythm: Normal rate and regular rhythm.     Pulses: Normal pulses.     Heart sounds: Normal heart sounds.  Pulmonary:     Effort: Pulmonary effort is normal.     Breath sounds: Normal breath sounds.  Abdominal:     General: Bowel sounds are normal. There is no distension.     Palpations: Abdomen is soft.     Tenderness: There is no abdominal tenderness. There is no guarding or rebound.  Musculoskeletal:     Cervical back: Neck supple.  Skin:    General: Skin is warm and dry.     Findings: No erythema.  Neurological:     Mental Status: She is alert.     Cranial Nerves: No cranial nerve deficit.     Sensory: No sensory deficit.     Motor: No weakness.     Coordination: Coordination normal.     Comments: Mental Status:  Alert, thought content appropriate, somewhat confused.  Has difficulty naming the ages of her grandsons.  Speech fluent without evidence of aphasia. Able to follow 2 step commands without difficulty.  Cranial Nerves:  II:  Peripheral visual fields grossly normal, pupils equal, round, reactive to light III,IV, VI: ptosis not present, extra-ocular motions intact bilaterally  V,VII: smile symmetric, facial light touch sensation equal VIII: hearing grossly normal to voice  X: uvula elevates symmetrically  XI: bilateral shoulder shrug symmetric and strong XII: midline tongue extension without fassiculations Motor:  Normal tone. 5/5 strength of BUE and BLE major muscle groups including  strong and equal grip strength and dorsiflexion/plantar flexion Sensory: light touch normal in all extremities. Cerebellar: normal finger-to-nose with bilateral upper extremities, Romberg sign absent Gait: normal gait and balance. Able to walk on toes and heels with ease.    Psychiatric:        Behavior: Behavior normal.     ED Results / Procedures / Treatments   Labs (all labs ordered are listed, but only abnormal results are displayed) Labs Reviewed  COMPREHENSIVE METABOLIC PANEL - Abnormal; Notable for the following components:      Result Value   Potassium 3.3 (*)    GFR calc non Af Amer 60 (*)    All other components within normal limits  URINALYSIS, ROUTINE W REFLEX MICROSCOPIC - Abnormal; Notable for the following components:   Specific Gravity, Urine 1.004 (*)    Hgb urine dipstick SMALL (*)    Leukocytes,Ua TRACE (*)    All other components within normal limits  CBC  RAPID URINE DRUG SCREEN, HOSP PERFORMED    EKG EKG Interpretation  Date/Time:  Friday September 12 2019 18:47:45 EDT Ventricular Rate:  63 PR Interval:  174 QRS Duration: 136 QT Interval:  438 QTC Calculation: 448 R Axis:   69 Text Interpretation: Normal sinus rhythm Non-specific intra-ventricular conduction block Cannot rule out Anteroseptal infarct , age undetermined T wave abnormality, consider lateral ischemia Abnormal ECG No old tracing to compare Confirmed by Addison Lank [id number]) on 09/13/2019 12:14:06 AM   Radiology CT Head Wo Contrast  Result Date: 09/13/2019 CLINICAL DATA:  Altered level of consciousness EXAM: CT HEAD WITHOUT CONTRAST TECHNIQUE: Contiguous axial images were obtained from the base of the skull through the vertex without intravenous contrast. COMPARISON:  10/25/2017 FINDINGS: Brain: No acute infarct or hemorrhage. Lateral ventricles and midline structures appear unremarkable. No acute extra-axial  fluid collections. No mass effect. Vascular: No hyperdense vessel or unexpected  calcification. Skull: Normal. Negative for fracture or focal lesion. Sinuses/Orbits: No acute finding. Other: None. IMPRESSION: 1. No acute intracranial process. Electronically Signed   By: Randa Ngo M.D.   On: 09/13/2019 03:48    Procedures Procedures (including critical care time)  Medications Ordered in ED Medications  potassium chloride SA (KLOR-CON) CR tablet 40 mEq (40 mEq Oral Given 09/13/19 0331)  sodium chloride 0.9 % bolus 500 mL (0 mLs Intravenous Stopped 09/13/19 0537)    ED Course  I have reviewed the triage vital signs and the nursing notes.  Pertinent labs & imaging results that were available during my care of the patient were reviewed by me and considered in my medical decision making (see chart for details).    MDM Rules/Calculators/A&P                      Patient presenting for evaluation of transient confusion.  Patient is afebrile, initially mildly hypertensive but vital signs otherwise stable.  She is nontoxic in appearance.  She has a normal neurologic examination with no focal deficits though has some memory difficulty, cannot recall part of her day yesterday and reportedly awoke this morning and did not realize she was in her own home.  She is now back to baseline.  History and physical examination suggests transient global amnesia.  Doubt CVA, intracranial hemorrhage, or TIA.  Will obtain lab work and CT head for further evaluation.  Lab work reviewed by me shows no leukocytosis, no anemia, no renal insufficiency.  No evidence of UTI.  Her potassium is a little bit low, this was replenished orally in the ED.  UDS is negative.  Head CT shows no acute intracranial abnormalities.  EKG shows no acute ischemic abnormalities.  On reevaluation patient is resting comfortably in no apparent distress.  She reports that she feels well and would like to go home.  She has plans to follow-up with her PCP on an outpatient basis and discuss counseling as she has had difficulty  adjusting after the loss of her husband in February.  Discussed strict ED return precautions.  Patient and daughter who is at the bedside verbalized understanding of and agreement with plan and patient is stable for discharge at this time.  Discussed with Dr. Dayna Barker who agrees with assessment and plan at this time  Final Clinical Impression(s) / ED Diagnoses Final diagnoses:  Confusion  Hypokalemia    Rx / DC Orders ED Discharge Orders         Ordered    Ambulatory referral to Neurology    Comments: An appointment is requested in approximately: 1 week   09/13/19 [place], [place], PA-C 09/14/19 [id number]    Merrily Pew, MD 09/16/19 [phone]

## 2019-09-13 NOTE — Discharge Instructions (Signed)
Your work-up today was reassuring.  I suspect that you may have had an episode of transient global amnesia.  I would recommend follow-up with neurology for this.  Your potassium was a little bit low, I suspect you may have been a little bit dehydrated.  We gave you potassium in the emergency department to treat this.  Follow-up with your primary care provider for reevaluation of your symptoms as well.  Return to the emergency department if any concerning signs or symptoms develop such as severe worsening headache, persistent vision changes or vision loss, weakness to 1 side of the body, difficulty speaking or swallowing, loss of consciousness.

## 2019-09-13 NOTE — ED Notes (Signed)
Pt to CT

## 2019-09-24 ENCOUNTER — Other Ambulatory Visit: Payer: Medicare Other

## 2019-10-08 ENCOUNTER — Other Ambulatory Visit: Payer: Self-pay | Admitting: Internal Medicine

## 2019-10-08 DIAGNOSIS — K2100 Gastro-esophageal reflux disease with esophagitis, without bleeding: Secondary | ICD-10-CM

## 2019-10-08 DIAGNOSIS — I10 Essential (primary) hypertension: Secondary | ICD-10-CM

## 2019-10-10 ENCOUNTER — Other Ambulatory Visit: Payer: Self-pay | Admitting: Internal Medicine

## 2019-10-10 DIAGNOSIS — G2581 Restless legs syndrome: Secondary | ICD-10-CM

## 2019-10-24 DIAGNOSIS — Z1231 Encounter for screening mammogram for malignant neoplasm of breast: Secondary | ICD-10-CM | POA: Diagnosis not present

## 2019-10-24 LAB — HM MAMMOGRAPHY

## 2019-11-12 ENCOUNTER — Encounter: Payer: Self-pay | Admitting: Internal Medicine

## 2019-11-13 ENCOUNTER — Other Ambulatory Visit: Payer: Medicare Other

## 2019-11-18 ENCOUNTER — Other Ambulatory Visit: Payer: Self-pay

## 2019-11-18 ENCOUNTER — Ambulatory Visit (INDEPENDENT_AMBULATORY_CARE_PROVIDER_SITE_OTHER): Payer: Medicare Other | Admitting: Internal Medicine

## 2019-11-18 ENCOUNTER — Other Ambulatory Visit: Payer: Medicare Other

## 2019-11-18 ENCOUNTER — Encounter: Payer: Self-pay | Admitting: Internal Medicine

## 2019-11-18 VITALS — BP 138/86 | HR 76 | Temp 98.2°F | Resp 16 | Ht 60.0 in | Wt 138.4 lb

## 2019-11-18 DIAGNOSIS — E7800 Pure hypercholesterolemia, unspecified: Secondary | ICD-10-CM

## 2019-11-18 DIAGNOSIS — R0609 Other forms of dyspnea: Secondary | ICD-10-CM

## 2019-11-18 DIAGNOSIS — I10 Essential (primary) hypertension: Secondary | ICD-10-CM

## 2019-11-18 DIAGNOSIS — F329 Major depressive disorder, single episode, unspecified: Secondary | ICD-10-CM | POA: Diagnosis not present

## 2019-11-18 DIAGNOSIS — Z0000 Encounter for general adult medical examination without abnormal findings: Secondary | ICD-10-CM | POA: Diagnosis not present

## 2019-11-18 DIAGNOSIS — R0600 Dyspnea, unspecified: Secondary | ICD-10-CM

## 2019-11-18 DIAGNOSIS — E781 Pure hyperglyceridemia: Secondary | ICD-10-CM

## 2019-11-18 DIAGNOSIS — T502X5A Adverse effect of carbonic-anhydrase inhibitors, benzothiadiazides and other diuretics, initial encounter: Secondary | ICD-10-CM

## 2019-11-18 DIAGNOSIS — F450 Somatization disorder: Secondary | ICD-10-CM

## 2019-11-18 DIAGNOSIS — R911 Solitary pulmonary nodule: Secondary | ICD-10-CM

## 2019-11-18 DIAGNOSIS — E876 Hypokalemia: Secondary | ICD-10-CM

## 2019-11-18 LAB — LIPID PANEL
Cholesterol: 112 mg/dL (ref 0–200)
HDL: 46.3 mg/dL (ref >39.00)
LDL Cholesterol: 47 mg/dL (ref 0–99)
NonHDL: 66.1
Total CHOL/HDL Ratio: 2
Triglycerides: 94 mg/dL (ref 0.0–149.0)
VLDL: 18.8 mg/dL (ref 0.0–40.0)

## 2019-11-18 LAB — BASIC METABOLIC PANEL
BUN: 10 mg/dL (ref 6–23)
CO2: 32 mEq/L (ref 19–32)
Calcium: 9.3 mg/dL (ref 8.4–10.5)
Chloride: 101 mEq/L (ref 96–112)
Creatinine, Ser: 0.83 mg/dL (ref 0.40–1.20)
GFR: 68.11 mL/min (ref >60.00)
Glucose, Bld: 110 mg/dL — ABNORMAL HIGH (ref 70–99)
Potassium: 3.4 mEq/L — ABNORMAL LOW (ref 3.5–5.1)
Sodium: 138 mEq/L (ref 135–145)

## 2019-11-18 LAB — CBC WITH DIFFERENTIAL/PLATELET
Basophils Absolute: 0 10*3/uL (ref 0.0–0.1)
Basophils Relative: 0.6 % (ref 0.0–3.0)
Eosinophils Absolute: 0.3 10*3/uL (ref 0.0–0.7)
Eosinophils Relative: 4.5 % (ref 0.0–5.0)
HCT: 39.6 % (ref 36.0–46.0)
Hemoglobin: 13.4 g/dL (ref 12.0–15.0)
Lymphocytes Relative: 24.8 % (ref 12.0–46.0)
Lymphs Abs: 1.7 10*3/uL (ref 0.7–4.0)
MCHC: 33.9 g/dL (ref 30.0–36.0)
MCV: 90.2 fl (ref 78.0–100.0)
Monocytes Absolute: 0.5 10*3/uL (ref 0.1–1.0)
Monocytes Relative: 7.3 % (ref 3.0–12.0)
Neutro Abs: 4.3 10*3/uL (ref 1.4–7.7)
Neutrophils Relative %: 62.8 % (ref 43.0–77.0)
Platelets: 322 10*3/uL (ref 150.0–400.0)
RBC: 4.39 Mil/uL (ref 3.87–5.11)
RDW: 13.8 % (ref 11.5–15.5)
WBC: 6.9 10*3/uL (ref 4.0–10.5)

## 2019-11-18 LAB — HEPATIC FUNCTION PANEL
ALT: 20 U/L (ref 0–35)
AST: 24 U/L (ref 0–37)
Albumin: 4.2 g/dL (ref 3.5–5.2)
Alkaline Phosphatase: 111 U/L (ref 39–117)
Bilirubin, Direct: 0.2 mg/dL (ref 0.0–0.3)
Total Bilirubin: 0.7 mg/dL (ref 0.2–1.2)
Total Protein: 6.7 g/dL (ref 6.0–8.3)

## 2019-11-18 LAB — TROPONIN I (HIGH SENSITIVITY): High Sens Troponin I: 8 ng/L (ref 2–17)

## 2019-11-18 LAB — TSH: TSH: 2.52 u[IU]/mL (ref 0.35–4.50)

## 2019-11-18 LAB — BRAIN NATRIURETIC PEPTIDE: Pro B Natriuretic peptide (BNP): 45 pg/mL (ref 0.0–100.0)

## 2019-11-18 MED ORDER — BREXPIPRAZOLE 2 MG PO TABS: 2.0000 mg | ORAL_TABLET | Freq: Every day | ORAL | 0 refills | Status: DC

## 2019-11-18 MED ORDER — BREXPIPRAZOLE 1 MG PO TABS: 1.0000 mg | ORAL_TABLET | Freq: Every day | ORAL | 0 refills | Status: AC

## 2019-11-18 MED ORDER — REXULTI 0.5 MG PO TABS: 1.0000 | ORAL_TABLET | Freq: Every day | ORAL | 0 refills | Status: AC

## 2019-11-18 NOTE — Progress Notes (Signed)
Subjective:  Patient ID: Vicki Martin, female    DOB: 1950-11-23  Age: 69 y.o. MRN: [id number]  CC: Hypertension, Depression, Hyperlipidemia, and Annual Exam   HPI Vicki Martin presents for a CPX.   Her son committed suicide about one week ago. Her husband died about 4 months ago. She complains of worsening s/s of depression with crying spells, anhedonia, anxiety, and insomnia.  She is compliant with clonazepam and duloxetine.  She would like to add something else to help her get better control of her signs and symptoms of depression.  She continues to complain of dyspnea on exertion.  She has been evaluated by cardiology for a left bundle branch block and no cardiac source for the DOE has been found.  She is active and denies any recent episodes of chest pain, diaphoresis, dizziness, or lightheadedness.  Outpatient Medications Prior to Visit  Medication Sig Dispense Refill  . carvedilol (COREG) 6.25 MG tablet Take 1 tablet (6.25 mg total) by mouth 2 (two) times daily. 180 tablet 0  . clonazePAM (KLONOPIN) 1 MG tablet Take 1 tablet (1 mg total) by mouth 2 (two) times daily as needed for anxiety. 60 tablet 1  . DULoxetine (CYMBALTA) 60 MG capsule Take 1 capsule by mouth once daily 90 capsule 1  . gabapentin (NEURONTIN) 300 MG capsule Take 1 capsule by mouth at bedtime 90 capsule 0  . indapamide (LOZOL) 1.25 MG tablet Take 1 tablet by mouth once daily 90 tablet 0  . omeprazole (PRILOSEC) 40 MG capsule Take 1 capsule by mouth once daily 90 capsule 0  . atorvastatin (LIPITOR) 40 MG tablet Take 1 tablet by mouth once daily 90 tablet 1   No facility-administered medications prior to visit.    ROS Review of Systems  Constitutional: Negative for chills, diaphoresis, fatigue and unexpected weight change.  HENT: Negative.   Eyes: Negative for visual disturbance.  Respiratory: Positive for shortness of breath (DOE). Negative for cough, chest tightness and wheezing.   Cardiovascular:  Negative for chest pain, palpitations and leg swelling.  Gastrointestinal: Negative for abdominal pain, constipation, diarrhea, nausea and vomiting.  Endocrine: Negative.   Genitourinary: Negative.  Negative for difficulty urinating.  Musculoskeletal: Negative.  Negative for arthralgias and myalgias.  Skin: Negative.  Negative for color change and pallor.  Neurological: Negative.  Negative for dizziness, weakness and light-headedness.  Hematological: Negative for adenopathy. Does not bruise/bleed easily.  Psychiatric/Behavioral: Positive for dysphoric mood and sleep disturbance. Negative for agitation, confusion, decreased concentration, self-injury and suicidal ideas. The patient is nervous/anxious.     Objective:  BP 138/86 (BP Location: Left Arm, Patient Position: Sitting, Cuff Size: Normal)   Pulse 76   Temp 98.2 F (36.8 C) (Oral)   Resp 16   Ht 5' (1.524 m)   Wt 138 lb 6 oz (62.8 kg)   SpO2 95%   BMI 27.02 kg/m   BP Readings from Last 3 Encounters:  11/18/19 138/86  09/13/19 120/61  09/12/19 (!) 145/70    Wt Readings from Last 3 Encounters:  11/18/19 138 lb 6 oz (62.8 kg)  09/12/19 137 lb (62.1 kg)  09/12/19 137 lb (62.1 kg)    Physical Exam Constitutional:      Appearance: Normal appearance.  HENT:     Nose: Nose normal.     Mouth/Throat:     Mouth: Mucous membranes are moist.  Eyes:     General: No scleral icterus.    Conjunctiva/sclera: Conjunctivae normal.  Cardiovascular:  Rate and Rhythm: Normal rate and regular rhythm.     Pulses: Normal pulses.     Heart sounds: No murmur heard.      Comments: EKG - NSR, 66 bpm LAD and LBBB - no change Pulmonary:     Effort: Pulmonary effort is normal.     Breath sounds: No stridor. No wheezing, rhonchi or rales.  Abdominal:     General: Abdomen is flat. Bowel sounds are normal. There is no distension.     Palpations: Abdomen is soft. There is no hepatomegaly, splenomegaly or mass.     Tenderness: There is  no abdominal tenderness.  Musculoskeletal:        General: No swelling. Normal range of motion.     Cervical back: Neck supple.     Right lower leg: No edema.     Left lower leg: No edema.  Lymphadenopathy:     Cervical: No cervical adenopathy.  Skin:    General: Skin is warm.     Coloration: Skin is not pale.     Findings: No lesion or rash.  Neurological:     General: No focal deficit present.     Mental Status: She is alert.  Psychiatric:        Attention and Perception: Attention normal. She is attentive.        Mood and Affect: Mood is anxious and depressed. Affect is tearful.        Speech: Speech is tangential. Speech is not rapid and pressured or delayed.        Behavior: Behavior normal.        Thought Content: Thought content normal. Thought content is not paranoid or delusional. Thought content does not include homicidal or suicidal ideation.        Cognition and Memory: Cognition normal.     Lab Results  Component Value Date   WBC 6.9 11/18/2019   HGB 13.4 11/18/2019   HCT 39.6 11/18/2019   PLT 322.0 11/18/2019   GLUCOSE 110 (H) 11/18/2019   CHOL 112 11/18/2019   TRIG 94.0 11/18/2019   HDL 46.30 11/18/2019   LDLDIRECT 40.0 10/24/2018   LDLCALC 47 11/18/2019   ALT 20 11/18/2019   AST 24 11/18/2019   NA 138 11/18/2019   K 3.4 (L) 11/18/2019   CL 101 11/18/2019   CREATININE 0.83 11/18/2019   BUN 10 11/18/2019   CO2 32 11/18/2019   TSH 2.52 11/18/2019   HGBA1C 5.5 03/09/2016    CT Head Wo Contrast  Result Date: 09/13/2019 CLINICAL DATA:  Altered level of consciousness EXAM: CT HEAD WITHOUT CONTRAST TECHNIQUE: Contiguous axial images were obtained from the base of the skull through the vertex without intravenous contrast. COMPARISON:  10/25/2017 FINDINGS: Brain: No acute infarct or hemorrhage. Lateral ventricles and midline structures appear unremarkable. No acute extra-axial fluid collections. No mass effect. Vascular: No hyperdense vessel or unexpected  calcification. Skull: Normal. Negative for fracture or focal lesion. Sinuses/Orbits: No acute finding. Other: None. IMPRESSION: 1. No acute intracranial process. Electronically Signed   By: Randa Ngo M.D.   On: 09/13/2019 03:48    Assessment & Plan:   Vicki Martin was seen today for hypertension, depression, hyperlipidemia and annual exam.  Diagnoses and all orders for this visit:  Essential hypertension- Her blood pressure is well controlled but she has developed hypokalemia.  I recommended that she start taking a potassium supplement. -     CBC with Differential/Platelet; Future -     Basic metabolic panel; Future -  TSH; Future -     potassium chloride SA (KLOR-CON) 20 MEQ tablet; Take 1 tablet (20 mEq total) by mouth 2 (two) times daily.  Depression with somatization- She has worsening signs and symptoms of depression.  I recommended that she add the atypical antipsychotic brexpiprazole to her current regimen. -     Brexpiprazole (REXULTI) 0.5 MG TABS; Take 1 tablet (0.5 mg total) by mouth daily for 7 days. -     brexpiprazole (REXULTI) 1 MG TABS tablet; Take 1 tablet (1 mg total) by mouth daily for 7 days. -     brexpiprazole (REXULTI) 2 MG TABS tablet; Take 1 tablet (2 mg total) by mouth daily for 7 days.  Routine general medical examination at a health care facility- Exam completed, labs reviewed, vaccines reviewed and updated, cancer screenings are up-to-date, patient education material was given.  Hypertriglyceridemia- Her triglycerides are normal now. -     Hepatic function panel; Future  DOE (dyspnea on exertion)- Work-up today is reassuring.  I have asked her to follow-up with her cardiologist to see if any additional cardiac testing needs to be performed. -     Brain natriuretic peptide; Future -     Troponin I (High Sensitivity); Future -     Ambulatory referral to Cardiology  Pure hypercholesterolemia- She has achieved her LDL goal and is doing well on the statin. -      TSH; Future -     Hepatic function panel; Future -     Lipid panel; Future -     atorvastatin (LIPITOR) 40 MG tablet; Take 1 tablet (40 mg total) by mouth daily.  Diuretic-induced hypokalemia -     potassium chloride SA (KLOR-CON) 20 MEQ tablet; Take 1 tablet (20 mEq total) by mouth 2 (two) times daily.   I have changed Vicki Martin's atorvastatin. I am also having her start on Rexulti, brexpiprazole, brexpiprazole, and potassium chloride SA. Additionally, I am having her maintain her clonazePAM, carvedilol, DULoxetine, omeprazole, indapamide, and gabapentin.  Meds ordered this encounter  Medications  . Brexpiprazole (REXULTI) 0.5 MG TABS    Sig: Take 1 tablet (0.5 mg total) by mouth daily for 7 days.    Dispense:  7 tablet    Refill:  0  . brexpiprazole (REXULTI) 1 MG TABS tablet    Sig: Take 1 tablet (1 mg total) by mouth daily for 7 days.    Dispense:  7 tablet    Refill:  0  . brexpiprazole (REXULTI) 2 MG TABS tablet    Sig: Take 1 tablet (2 mg total) by mouth daily for 7 days.    Dispense:  7 tablet    Refill:  0  . potassium chloride SA (KLOR-CON) 20 MEQ tablet    Sig: Take 1 tablet (20 mEq total) by mouth 2 (two) times daily.    Dispense:  180 tablet    Refill:  1  . atorvastatin (LIPITOR) 40 MG tablet    Sig: Take 1 tablet (40 mg total) by mouth daily.    Dispense:  90 tablet    Refill:  1   In addition to time spent on CPE, I spent 50 minutes in preparing to see the patient by review of recent labs, imaging and procedures, obtaining and reviewing separately obtained history, communicating with the patient and family or caregiver, ordering medications, tests or procedures, and documenting clinical information in the EHR including the differential Dx, treatment, and any further evaluation and other management of 1.  Essential hypertension 2. Depression with somatization 3. Hypertriglyceridemia 4. DOE (dyspnea on exertion) 5. Pure hypercholesterolemia 6.  Diuretic-induced hypokalemia     Follow-up: Return in about 3 weeks (around 12/09/2019).  Scarlette Calico, MD

## 2019-11-18 NOTE — Patient Instructions (Signed)
Major Depressive Disorder, Adult Major depressive disorder (MDD) is a mental health condition. It may also be called clinical depression or unipolar depression. MDD usually causes feelings of sadness, hopelessness, or helplessness. MDD can also cause physical symptoms. It can interfere with work, school, relationships, and other everyday activities. MDD may be mild, moderate, or severe. It may occur once (single episode major depressive disorder) or it may occur multiple times (recurrent major depressive disorder). What are the causes? The exact cause of this condition is not known. MDD is most likely caused by a combination of things, which may include:  Genetic factors. These are traits that are passed along from parent to child.  Individual factors. Your personality, your behavior, and the way you handle your thoughts and feelings may contribute to MDD. This includes personality traits and behaviors learned from others.  Physical factors, such as: ? Differences in the part of your brain that controls emotion. This part of your brain may be different than it is in people who do not have MDD. ? Long-term (chronic) medical or psychiatric illnesses.  Social factors. Traumatic experiences or major life changes may play a role in the development of MDD. What increases the risk? This condition is more likely to develop in women. The following factors may also make you more likely to develop MDD:  A family history of depression.  Troubled family relationships.  Abnormally low levels of certain brain chemicals.  Traumatic events in childhood, especially abuse or the loss of a parent.  Being under a lot of stress, or long-term stress, especially from upsetting life experiences or losses.  A history of: ? Chronic physical illness. ? Other mental health disorders. ? Substance abuse.  Poor living conditions.  Experiencing social exclusion or discrimination on a regular basis. What are the  signs or symptoms? The main symptoms of MDD typically include:  Constant depressed or irritable mood.  Loss of interest in things and activities. MDD symptoms may also include:  Sleeping or eating too much or too little.  Unexplained weight change.  Fatigue or low energy.  Feelings of worthlessness or guilt.  Difficulty thinking clearly or making decisions.  Thoughts of suicide or of harming others.  Physical agitation or weakness.  Isolation. Severe cases of MDD may also occur with other symptoms, such as:  Delusions or hallucinations, in which you imagine things that are not real (psychotic depression).  Low-level depression that lasts at least a year (chronic depression or persistent depressive disorder).  Extreme sadness and hopelessness (melancholic depression).  Trouble speaking and moving (catatonic depression). How is this diagnosed? This condition may be diagnosed based on:  Your symptoms.  Your medical history, including your mental health history. This may involve tests to evaluate your mental health. You may be asked questions about your lifestyle, including any drug and alcohol use, and how long you have had symptoms of MDD.  A physical exam.  Blood tests to rule out other conditions. You must have a depressed mood and at least four other MDD symptoms most of the day, nearly every day in the same 2-week timeframe before your health care provider can confirm a diagnosis of MDD. How is this treated? This condition is usually treated by mental health professionals, such as psychologists, psychiatrists, and clinical social workers. You may need more than one type of treatment. Treatment may include:  Psychotherapy. This is also called talk therapy or counseling. Types of psychotherapy include: ? Cognitive behavioral therapy (CBT). This type of therapy   teaches you to recognize unhealthy feelings, thoughts, and behaviors, and replace them with positive thoughts  and actions. ? Interpersonal therapy (IPT). This helps you to improve the way you relate to and communicate with others. ? Family therapy. This treatment includes members of your family.  Medicine to treat anxiety and depression, or to help you control certain emotions and behaviors.  Lifestyle changes, such as: ? Limiting alcohol and drug use. ? Exercising regularly. ? Getting plenty of sleep. ? Making healthy eating choices. ? Spending more time outdoors.  Treatments involving stimulation of the brain can be used in situations with extremely severe symptoms, or when medicine or other therapies do not work over time. These treatments include electroconvulsive therapy, transcranial magnetic stimulation, and vagal nerve stimulation. Follow these instructions at home: Activity  Return to your normal activities as told by your health care provider.  Exercise regularly and spend time outdoors as told by your health care provider. General instructions  Take over-the-counter and prescription medicines only as told by your health care provider.  Do not drink alcohol. If you drink alcohol, limit your alcohol intake to no more than 1 drink a day for nonpregnant women and 2 drinks a day for men. One drink equals 12 oz of beer, 5 oz of wine, or 1 oz of hard liquor. Alcohol can affect any antidepressant medicines you are taking. Talk to your health care provider about your alcohol use.  Eat a healthy diet and get plenty of sleep.  Find activities that you enjoy doing, and make time to do them.  Consider joining a support group. Your health care provider may be able to recommend a support group.  Keep all follow-up visits as told by your health care provider. This is important. Where to find more information National Alliance on Mental Illness  www.nami.org [place] National Institute of Mental Health  www.nimh.nih.gov National Suicide Prevention Lifeline  1-800-273-TALK (8255). This is  free, 24-hour help. Contact a health care provider if:  Your symptoms get worse.  You develop new symptoms. Get help right away if:  You self-harm.  You have serious thoughts about hurting yourself or others.  You see, hear, taste, smell, or feel things that are not present (hallucinate). This information is not intended to replace advice given to you by your health care provider. Make sure you discuss any questions you have with your health care provider. Document Revised: 04/27/2017 Document Reviewed: 11/24/2015 Elsevier Patient Education  2020 Elsevier Inc.  

## 2019-11-19 ENCOUNTER — Other Ambulatory Visit: Payer: Self-pay | Admitting: Internal Medicine

## 2019-11-19 DIAGNOSIS — E7800 Pure hypercholesterolemia, unspecified: Secondary | ICD-10-CM

## 2019-11-19 DIAGNOSIS — T502X5A Adverse effect of carbonic-anhydrase inhibitors, benzothiadiazides and other diuretics, initial encounter: Secondary | ICD-10-CM | POA: Insufficient documentation

## 2019-11-19 MED ORDER — POTASSIUM CHLORIDE CRYS ER 20 MEQ PO TBCR: 20.0000 meq | EXTENDED_RELEASE_TABLET | Freq: Two times a day (BID) | ORAL | 1 refills | Status: DC

## 2019-11-19 MED ORDER — ATORVASTATIN CALCIUM 40 MG PO TABS: 40.0000 mg | ORAL_TABLET | Freq: Every day | ORAL | 1 refills | Status: DC

## 2019-11-25 ENCOUNTER — Ambulatory Visit
Admission: RE | Admit: 2019-11-25 | Discharge: 2019-11-25 | Disposition: A | Discharge status: Home / Self Care | Payer: Medicare Other | Source: Ambulatory Visit | Attending: Internal Medicine | Admitting: Internal Medicine

## 2019-11-25 DIAGNOSIS — R911 Solitary pulmonary nodule: Secondary | ICD-10-CM

## 2019-11-26 ENCOUNTER — Other Ambulatory Visit: Payer: Self-pay | Admitting: Internal Medicine

## 2019-11-26 DIAGNOSIS — I10 Essential (primary) hypertension: Secondary | ICD-10-CM

## 2019-12-14 ENCOUNTER — Encounter: Payer: Self-pay | Admitting: Internal Medicine

## 2019-12-15 ENCOUNTER — Other Ambulatory Visit: Payer: Self-pay | Admitting: Internal Medicine

## 2019-12-15 DIAGNOSIS — F32A Depression, unspecified: Secondary | ICD-10-CM

## 2019-12-15 MED ORDER — BREXPIPRAZOLE 2 MG PO TABS: 2.0000 mg | ORAL_TABLET | Freq: Every day | ORAL | 1 refills | Status: DC

## 2019-12-16 ENCOUNTER — Encounter: Payer: Self-pay | Admitting: Internal Medicine

## 2019-12-17 ENCOUNTER — Telehealth: Payer: Self-pay

## 2019-12-17 NOTE — Telephone Encounter (Signed)
Key: [id number]

## 2019-12-18 NOTE — Telephone Encounter (Signed)
Medication approved through 05/28/20  Determination sent to scan

## 2019-12-19 ENCOUNTER — Other Ambulatory Visit: Payer: Self-pay | Admitting: Internal Medicine

## 2019-12-19 DIAGNOSIS — F332 Major depressive disorder, recurrent severe without psychotic features: Secondary | ICD-10-CM | POA: Insufficient documentation

## 2019-12-19 MED ORDER — ARIPIPRAZOLE 5 MG PO TABS: 5.0000 mg | ORAL_TABLET | Freq: Every day | ORAL | 0 refills | Status: DC

## 2020-01-05 ENCOUNTER — Ambulatory Visit: Payer: Medicare Other | Admitting: Cardiovascular Disease

## 2020-01-25 ENCOUNTER — Other Ambulatory Visit: Payer: Self-pay | Admitting: Internal Medicine

## 2020-01-25 DIAGNOSIS — F4322 Adjustment disorder with anxiety: Secondary | ICD-10-CM

## 2020-01-25 DIAGNOSIS — I10 Essential (primary) hypertension: Secondary | ICD-10-CM

## 2020-02-03 ENCOUNTER — Other Ambulatory Visit: Payer: Self-pay

## 2020-02-03 ENCOUNTER — Encounter: Payer: Self-pay | Admitting: Cardiovascular Disease

## 2020-02-03 ENCOUNTER — Ambulatory Visit (INDEPENDENT_AMBULATORY_CARE_PROVIDER_SITE_OTHER): Payer: Medicare Other | Admitting: Cardiovascular Disease

## 2020-02-03 VITALS — BP 122/84 | HR 68 | Ht 61.0 in | Wt 143.2 lb

## 2020-02-03 DIAGNOSIS — I10 Essential (primary) hypertension: Secondary | ICD-10-CM | POA: Diagnosis not present

## 2020-02-03 DIAGNOSIS — I447 Left bundle-branch block, unspecified: Secondary | ICD-10-CM | POA: Diagnosis not present

## 2020-02-03 NOTE — Progress Notes (Signed)
Cardiology Office Note   Date:  02/03/2020   ID:  Vicki Martin, DOB [birth year], MRN [id number]  PCP:  Janith Lima, MD  Cardiologist:   Mertie Moores, MD   Chief Complaint  Patient presents with  . Hyperlipidemia   Problem List 1. Essential HTN 2. Murmur 3. Obstructive sleep apnea    -  Does not have  CPAP ( was not mentioned by her doctor )  4. Hyperlipidemia    Previous notes   Vicki Martin is a 69 y.o. female who presents for evaluation of heart murmur Also has had squeezing her chest for the past 3 months . Tightness  Not exertional , not related to twising of her torso .  Not related to exertion Lasts few seconds.   is associated with dyspnea.   Able to bring in the groceries without any problems  Gets SOB and dizzy when she walks too far when shopping   Has very high triglyceride levels.  Was not able to afford the medication ..   Oct. 24, 2017 Doing ok from a cardiac standpoint Has some occasional left sided chest pain .   Is not sleeping well.   She had a Myoview study in April, 2017 which revealed a apical defect that was most likely artifact. She has normal left ventricle systolic function. There is no evidence of ischemia. The study was intermediate risk.  Sept. 27, 2018:   Vicki Martin is seen today  BP looks good Continues to have CP  Pain starts in her back and  radiates through to her chest  Gets throat tightness.   Associated with dyspnea,  Goes to her throat. Comes on with rest or exertion.   Has awakened her at night.   Does not get any regular exercise  Now has some knee issues and is not able to walk much   Feb. 27, 2020   Vicki Martin was having some episodes of chest pain at her last office visit.  We obtained a CT coronary angiogram. The aorta was normal. Coronary calcium score was 0.  Was normal coronary arteries. Getting some exercise   Sept. 7, 2021    Vicki Martin is seen today for follow up of her LBBB, HTN, HLD  and  atypical chest  pain A Coronary CT angiogram from Nov. 2018 revealed a cornary calcium score of 0 and normal coronary arteries. myoview study from April , 2017 revealed normal LV funciton with EF 58%.   She has not had an echo   Labs from Dr. Ronnald Ramp visit were reviewed.    LDL = 47 Chol = 112 Trigs = 94 HDL = 46  BNP = 45   She has been under lots of stress. She lost her husband earlier this year Her son comitted suicide in mid June, 2021 Echo    Has had some lower back pain and then radiates up to the center of her upper back  Exercises, works out in her yard with out any cp  Legs hurt all the time  - will try holding Lipitor for several weeks to see if that hlep  Throbbing pain ,  Not associated with exercise    Past Medical History:  Diagnosis Date  . Anxiety   . Arthritis   . Depression   . Family history of pancreatic cancer   . Family history of stomach cancer   . GERD (gastroesophageal reflux disease)   . Heart murmur   . Hyperlipidemia    no  medications  . Hypertension   . Migraines   . Sleep apnea    pt declines CPAP    Past Surgical History:  Procedure Laterality Date  . ABDOMINAL HYSTERECTOMY     reports complet but with ovaries retained  . CHOLECYSTECTOMY    . removed hip bone to replace vertebrae       Current Outpatient Medications  Medication Sig Dispense Refill  . ARIPiprazole (ABILIFY) 5 MG tablet Take 1 tablet (5 mg total) by mouth daily. 90 tablet 0  . atorvastatin (LIPITOR) 40 MG tablet Take 1 tablet (40 mg total) by mouth daily. 90 tablet 1  . carvedilol (COREG) 6.25 MG tablet Take 1 tablet by mouth twice daily 180 tablet 1  . clonazePAM (KLONOPIN) 1 MG tablet Take 1 tablet by mouth twice daily as needed for anxiety 60 tablet 0  . gabapentin (NEURONTIN) 300 MG capsule Take 1 capsule by mouth at bedtime 90 capsule 0  . potassium chloride SA (KLOR-CON) 20 MEQ tablet Take 1 tablet (20 mEq total) by mouth 2 (two) times daily. 180 tablet 1   No current  facility-administered medications for this visit.    Allergies:   Codeine and Penicillins    Social History:  The patient  reports that she has never smoked. She has never used smokeless tobacco. She reports that she does not drink alcohol and does not use drugs.   Family History:  The patient's family history includes COPD in her brother; Cancer in her maternal aunt and maternal uncle; Early death in her brother; Heart disease in her father; Hypertension in her brother and sister; Liver cancer in her brother; Lung cancer in her brother and another family member; Ovarian cysts in her daughter; Pancreatic cancer in her maternal aunt; Pancreatic cancer (age of onset: 75) in her mother; Rheum arthritis in her mother; Stomach cancer in her maternal aunt; Stomach cancer (age of onset: 21) in her mother.    ROS:  Please see the history of present illness.    Physical Exam: Blood pressure 122/84, pulse 68, height 5\' 1"  (1.549 m), weight 143 lb 3.2 oz (65 kg), SpO2 99 %.  GEN:  Well nourished, well developed in no acute distress HEENT: Normal NECK: No JVD; No carotid bruits LYMPHATICS: No lymphadenopathy CARDIAC: RRR , no murmurs, rubs, gallops RESPIRATORY:  Clear to auscultation without rales, wheezing or rhonchi  ABDOMEN: Soft, non-tender, non-distended MUSCULOSKELETAL:  No edema; No deformity  SKIN: Warm and dry NEUROLOGIC:  Alert and oriented x 3    EKG:      Sept. 7, 2021:  NSR at 67 .  LBBB   Recent Labs: 11/18/2019: ALT 20; BUN 10; Creatinine, Ser 0.83; Hemoglobin 13.4; Platelets 322.0; Potassium 3.4; Pro B Natriuretic peptide (BNP) 45.0; Sodium 138; TSH 2.52    Lipid Panel    Component Value Date/Time   CHOL 112 11/18/2019 1438   TRIG 94.0 11/18/2019 1438   HDL 46.30 11/18/2019 1438   CHOLHDL 2 11/18/2019 1438   VLDL 18.8 11/18/2019 1438   LDLCALC 47 11/18/2019 1438   LDLDIRECT 40.0 10/24/2018 1615      Wt Readings from Last 3 Encounters:  02/03/20 143 lb 3.2 oz (65  kg)  11/18/19 138 lb 6 oz (62.8 kg)  09/12/19 137 lb (62.1 kg)      Other studies Reviewed: Additional studies/ records that were reviewed today include: . Review of the above records demonstrates:    ASSESSMENT AND PLAN:  1.  Chest pain  -  non cardiac CP .   Coronary CT anginogram was normal -   Coronary calcium score of 0  Will have her follow up with her primary MD  She is having some back pain .   I do not think her back pain is cardiac given her coronary calcium score of 0 a few years ago.    In addition, a recent chest CT shows no pathology of her aorta     2. Hyperlipidemia   -  She is having some leg aches.   Will have her hold her atorvastatin for several weeks to see if this leg ache resolves.   If so, we will change to a PCSK9 inhibitor .  3. Essential HTN:    - BP is well controlled.     Current medicines are reviewed at length with the patient today.  The patient does not have concerns regarding medicines.  The following changes have been made:  no change  Labs/ tests ordered today include:   No orders of the defined types were placed in this encounter.   Disposition:    6 months office visit      Mertie Moores, MD  02/03/2020 4:50 PM    [facility] [address], [place], [place]  [address] Phone: [phone]; Fax: [phone]

## 2020-02-03 NOTE — Patient Instructions (Signed)
Medication Instructions:  Your physician has recommended you make the following change in your medication:  **HOLD Atorvastatin for 4-6 weeks to see if your leg pain improves Call our office to let us know *If you need a refill on your cardiac medications before your next appointment, please call your pharmacy*   Lab Work: None Ordered If you have labs (blood work) drawn today and your tests are completely normal, you will receive your results only by:  [place] (if you have MyChart) OR  A paper copy in the mail If you have any lab test that is abnormal or we need to change your treatment, we will call you to review the results.   Testing/Procedures: Your physician has requested that you have an echocardiogram. Echocardiography is a painless test that uses sound waves to create images of your heart. It provides your doctor with information about the size and shape of your heart and how well your hearts chambers and valves are working. This procedure takes approximately one hour. There are no restrictions for this procedure.     Follow-Up: At [facility], you and your health needs are our priority.  As part of our continuing mission to provide you with exceptional heart care, we have created designated Provider Care Teams.  These Care Teams include your primary Cardiologist (physician) and Advanced Practice Providers (APPs -  Physician Assistants and Nurse Practitioners) who all work together to provide you with the care you need, when you need it.  We recommend signing up for the patient portal called "MyChart".  Sign up information is provided on this After Visit Summary.  MyChart is used to connect with patients for Virtual Visits (Telemedicine).  Patients are able to view lab/test results, encounter notes, upcoming appointments, etc.  Non-urgent messages can be sent to your provider as well.   To learn more about what you can do with MyChart, go to [email or web address].     Your next appointment:   6 month(s)  The format for your next appointment:   In Person  Provider:   You may see Mertie Moores, MD or one of the following Advanced Practice Providers on your designated Care Team:    Richardson Dopp, PA-C  [place], [place]

## 2020-02-13 ENCOUNTER — Other Ambulatory Visit: Payer: Self-pay | Admitting: Internal Medicine

## 2020-02-13 DIAGNOSIS — G2581 Restless legs syndrome: Secondary | ICD-10-CM

## 2020-02-15 ENCOUNTER — Other Ambulatory Visit: Payer: Self-pay | Admitting: Internal Medicine

## 2020-02-15 DIAGNOSIS — K2100 Gastro-esophageal reflux disease with esophagitis, without bleeding: Secondary | ICD-10-CM

## 2020-02-16 ENCOUNTER — Ambulatory Visit (HOSPITAL_COMMUNITY): Payer: Medicare Other | Attending: Cardiology

## 2020-02-16 ENCOUNTER — Other Ambulatory Visit: Payer: Self-pay

## 2020-02-16 DIAGNOSIS — I447 Left bundle-branch block, unspecified: Secondary | ICD-10-CM

## 2020-02-17 LAB — ECHOCARDIOGRAM COMPLETE
Area-P 1/2: 2.5 cm2
S' Lateral: 2.4 cm

## 2020-02-22 ENCOUNTER — Other Ambulatory Visit: Payer: Self-pay | Admitting: Internal Medicine

## 2020-02-22 DIAGNOSIS — K2100 Gastro-esophageal reflux disease with esophagitis, without bleeding: Secondary | ICD-10-CM

## 2020-03-04 ENCOUNTER — Other Ambulatory Visit: Payer: Self-pay | Admitting: Internal Medicine

## 2020-03-04 DIAGNOSIS — K2100 Gastro-esophageal reflux disease with esophagitis, without bleeding: Secondary | ICD-10-CM

## 2020-03-08 ENCOUNTER — Other Ambulatory Visit: Payer: Self-pay | Admitting: Internal Medicine

## 2020-03-08 DIAGNOSIS — K2100 Gastro-esophageal reflux disease with esophagitis, without bleeding: Secondary | ICD-10-CM

## 2020-03-08 DIAGNOSIS — F332 Major depressive disorder, recurrent severe without psychotic features: Secondary | ICD-10-CM

## 2020-03-08 MED ORDER — ARIPIPRAZOLE 5 MG PO TABS
5.0000 mg | ORAL_TABLET | Freq: Every day | ORAL | 1 refills | Status: DC
Start: 2020-03-08 — End: 2020-10-17

## 2020-03-23 ENCOUNTER — Other Ambulatory Visit: Payer: Self-pay | Admitting: Internal Medicine

## 2020-03-23 DIAGNOSIS — F32A Depression, unspecified: Secondary | ICD-10-CM

## 2020-03-23 DIAGNOSIS — F329 Major depressive disorder, single episode, unspecified: Secondary | ICD-10-CM

## 2020-03-29 ENCOUNTER — Other Ambulatory Visit: Payer: Self-pay | Admitting: Internal Medicine

## 2020-03-29 DIAGNOSIS — F329 Major depressive disorder, single episode, unspecified: Secondary | ICD-10-CM

## 2020-03-29 DIAGNOSIS — F32A Depression, unspecified: Secondary | ICD-10-CM

## 2020-04-01 DIAGNOSIS — H2513 Age-related nuclear cataract, bilateral: Secondary | ICD-10-CM | POA: Diagnosis not present

## 2020-04-01 DIAGNOSIS — H1045 Other chronic allergic conjunctivitis: Secondary | ICD-10-CM | POA: Diagnosis not present

## 2020-04-01 DIAGNOSIS — H43812 Vitreous degeneration, left eye: Secondary | ICD-10-CM | POA: Diagnosis not present

## 2020-04-01 DIAGNOSIS — H43391 Other vitreous opacities, right eye: Secondary | ICD-10-CM | POA: Diagnosis not present

## 2020-04-06 ENCOUNTER — Ambulatory Visit (INDEPENDENT_AMBULATORY_CARE_PROVIDER_SITE_OTHER): Payer: Medicare Other | Admitting: Internal Medicine

## 2020-04-06 ENCOUNTER — Other Ambulatory Visit: Payer: Self-pay

## 2020-04-06 ENCOUNTER — Encounter: Payer: Self-pay | Admitting: Internal Medicine

## 2020-04-06 VITALS — BP 144/86 | HR 73 | Temp 98.5°F | Ht 61.0 in | Wt 143.0 lb

## 2020-04-06 DIAGNOSIS — T502X5A Adverse effect of carbonic-anhydrase inhibitors, benzothiadiazides and other diuretics, initial encounter: Secondary | ICD-10-CM | POA: Diagnosis not present

## 2020-04-06 DIAGNOSIS — F4322 Adjustment disorder with anxiety: Secondary | ICD-10-CM

## 2020-04-06 DIAGNOSIS — F332 Major depressive disorder, recurrent severe without psychotic features: Secondary | ICD-10-CM | POA: Diagnosis not present

## 2020-04-06 DIAGNOSIS — E876 Hypokalemia: Secondary | ICD-10-CM | POA: Diagnosis not present

## 2020-04-06 DIAGNOSIS — F410 Panic disorder [episodic paroxysmal anxiety] without agoraphobia: Secondary | ICD-10-CM | POA: Diagnosis not present

## 2020-04-06 DIAGNOSIS — I5189 Other ill-defined heart diseases: Secondary | ICD-10-CM

## 2020-04-06 DIAGNOSIS — I10 Essential (primary) hypertension: Secondary | ICD-10-CM

## 2020-04-06 DIAGNOSIS — M159 Polyosteoarthritis, unspecified: Secondary | ICD-10-CM

## 2020-04-06 DIAGNOSIS — Z23 Encounter for immunization: Secondary | ICD-10-CM | POA: Diagnosis not present

## 2020-04-06 LAB — BASIC METABOLIC PANEL
BUN: 12 mg/dL (ref 6–23)
CO2: 29 mEq/L (ref 19–32)
Calcium: 9 mg/dL (ref 8.4–10.5)
Chloride: 101 mEq/L (ref 96–112)
Creatinine, Ser: 1.02 mg/dL (ref 0.40–1.20)
GFR: 56.08 mL/min — ABNORMAL LOW (ref >60.00)
Glucose, Bld: 105 mg/dL — ABNORMAL HIGH (ref 70–99)
Potassium: 2.8 mEq/L — CL (ref 3.5–5.1)
Sodium: 139 mEq/L (ref 135–145)

## 2020-04-06 MED ORDER — SPIRONOLACTONE 25 MG PO TABS: 25.0000 mg | ORAL_TABLET | Freq: Every day | ORAL | 0 refills | Status: DC

## 2020-04-06 MED ORDER — CARVEDILOL 12.5 MG PO TABS: 12.5000 mg | ORAL_TABLET | Freq: Two times a day (BID) | ORAL | 0 refills | Status: DC

## 2020-04-06 MED ORDER — CLONAZEPAM 1 MG PO TABS: 1.0000 mg | ORAL_TABLET | Freq: Two times a day (BID) | ORAL | 3 refills | Status: DC | PRN

## 2020-04-06 MED ORDER — DULOXETINE HCL 30 MG PO CPEP: 30.0000 mg | ORAL_CAPSULE | Freq: Every day | ORAL | 0 refills | Status: DC

## 2020-04-06 MED ORDER — POTASSIUM CHLORIDE CRYS ER 20 MEQ PO TBCR: 20.0000 meq | EXTENDED_RELEASE_TABLET | Freq: Two times a day (BID) | ORAL | 0 refills | Status: DC

## 2020-04-06 NOTE — Patient Instructions (Signed)

## 2020-04-06 NOTE — Progress Notes (Signed)
Subjective:  Patient ID: Vicki Martin, female    DOB: 12-09-1950  Age: 69 y.o. MRN: [id number]  CC: Hypertension and Depression  This visit occurred during the SARS-CoV-2 public health emergency.  Safety protocols were in place, including screening questions prior to the visit, additional usage of staff PPE, and extensive cleaning of exam room while observing appropriate contact time as indicated for disinfecting solutions.    HPI Vernida S Sedivy presents for f/up - She was doing well on the combination of duloxetine and clonazepam until she ran out of it a couple months ago.  Now she complains of worsening crying spells, shaking episodes, panic attacks, anxiety, and insomnia.  She is not taking the potassium supplement because the pills are too big.  She has not been exercising or monitoring her blood pressure.  She denies headache, blurred vision, chest pain, shortness of breath, palpitations, edema, or myalgias.  Outpatient Medications Prior to Visit  Medication Sig Dispense Refill  . ARIPiprazole (ABILIFY) 5 MG tablet Take 1 tablet (5 mg total) by mouth daily. 90 tablet 1  . atorvastatin (LIPITOR) 40 MG tablet Take 1 tablet (40 mg total) by mouth daily. (Patient taking differently: Take 40 mg by mouth daily. ) 90 tablet 1  . gabapentin (NEURONTIN) 300 MG capsule Take 1 capsule by mouth at bedtime 90 capsule 1  . carvedilol (COREG) 6.25 MG tablet Take 1 tablet by mouth twice daily 180 tablet 1  . clonazePAM (KLONOPIN) 1 MG tablet Take 1 tablet by mouth twice daily as needed for anxiety 60 tablet 0  . potassium chloride SA (KLOR-CON) 20 MEQ tablet Take 1 tablet (20 mEq total) by mouth 2 (two) times daily. 180 tablet 1   No facility-administered medications prior to visit.    ROS Review of Systems  Constitutional: Negative for diaphoresis, fatigue and unexpected weight change.  HENT: Negative.   Eyes: Negative for visual disturbance.  Respiratory: Negative for cough, chest  tightness, shortness of breath and wheezing.   Cardiovascular: Negative for chest pain, palpitations and leg swelling.  Gastrointestinal: Negative for abdominal pain, diarrhea, nausea and vomiting.  Endocrine: Negative.   Genitourinary: Negative.  Negative for difficulty urinating and dysuria.  Musculoskeletal: Positive for arthralgias. Negative for myalgias.  Skin: Negative.  Negative for color change and pallor.  Neurological: Negative.  Negative for dizziness, weakness, light-headedness and headaches.  Hematological: Negative for adenopathy. Does not bruise/bleed easily.  Psychiatric/Behavioral: Positive for dysphoric mood and sleep disturbance. Negative for agitation, decreased concentration and suicidal ideas. The patient is nervous/anxious. The patient is not hyperactive.     Objective:  BP (!) 144/86   Pulse 73   Temp 98.5 F (36.9 C) (Oral)   Ht 5\' 1"  (1.549 m)   Wt 143 lb (64.9 kg)   SpO2 96%   BMI 27.02 kg/m   BP Readings from Last 3 Encounters:  04/06/20 (!) 144/86  02/03/20 122/84  11/18/19 138/86    Wt Readings from Last 3 Encounters:  04/06/20 143 lb (64.9 kg)  02/03/20 143 lb 3.2 oz (65 kg)  11/18/19 138 lb 6 oz (62.8 kg)    Physical Exam Vitals reviewed.  Constitutional:      Appearance: Normal appearance.  HENT:     Nose: Nose normal.     Mouth/Throat:     Mouth: Mucous membranes are moist.  Eyes:     General: No scleral icterus.    Conjunctiva/sclera: Conjunctivae normal.  Cardiovascular:     Rate and Rhythm: Normal  rate and regular rhythm.     Heart sounds: No murmur heard.   Pulmonary:     Effort: Pulmonary effort is normal.     Breath sounds: No stridor. No wheezing, rhonchi or rales.  Abdominal:     General: Abdomen is flat. Bowel sounds are normal. There is no distension.     Palpations: Abdomen is soft. There is no hepatomegaly, splenomegaly or mass.     Tenderness: There is no abdominal tenderness.  Musculoskeletal:        General:  Normal range of motion.     Cervical back: Neck supple.     Right lower leg: No edema.     Left lower leg: No edema.  Lymphadenopathy:     Cervical: No cervical adenopathy.  Skin:    General: Skin is warm and dry.     Coloration: Skin is not pale.  Neurological:     General: No focal deficit present.     Mental Status: She is alert.  Psychiatric:        Attention and Perception: Attention and perception normal. She is attentive. She does not perceive auditory hallucinations.        Mood and Affect: Mood is anxious and depressed. Affect is tearful. Affect is not flat.        Speech: Speech is tangential. Speech is not delayed or slurred.        Behavior: Behavior normal. Behavior is not slowed, aggressive or withdrawn.        Thought Content: Thought content normal.     Lab Results  Component Value Date   WBC 6.9 11/18/2019   HGB 13.4 11/18/2019   HCT 39.6 11/18/2019   PLT 322.0 11/18/2019   GLUCOSE 105 (H) 04/06/2020   CHOL 112 11/18/2019   TRIG 94.0 11/18/2019   HDL 46.30 11/18/2019   LDLDIRECT 40.0 10/24/2018   LDLCALC 47 11/18/2019   ALT 20 11/18/2019   AST 24 11/18/2019   NA 139 04/06/2020   K 2.8 (LL) 04/06/2020   CL 101 04/06/2020   CREATININE 1.02 04/06/2020   BUN 12 04/06/2020   CO2 29 04/06/2020   TSH 2.52 11/18/2019   HGBA1C 5.5 03/09/2016    CT Chest Wo Contrast  Result Date: 11/26/2019 CLINICAL DATA:  Follow-up right lower lobe nodule EXAM: CT CHEST WITHOUT CONTRAST TECHNIQUE: Multidetector CT imaging of the chest was performed following the standard protocol without IV contrast. COMPARISON:  Multiple previous exams to include 05/16/2018, 04/09/2017 and 03/16/2004. FINDINGS: Cardiovascular: Thoracic aorta shows no significant atherosclerotic calcifications. No aneurysmal dilatation is seen. No coronary calcifications are noted. No cardiac enlargement is noted. Mediastinum/Nodes: Thoracic inlet is within normal limits. No hilar or mediastinal adenopathy is  noted. The esophagus as visualized is within normal limits. Lungs/Pleura: The lungs are again well aerated bilaterally. Calcified granuloma is noted in the right upper lobe. Persistent nodular density is noted in the right lower lobe best seen on image number 98 of series 8 measuring approximately 4-5 mm in mean diameter. No other parenchymal nodules are seen. No focal infiltrate or effusion is noted. Upper Abdomen: Visualized upper abdomen shows no acute abnormality. Musculoskeletal: Degenerative change of the thoracic spine is seen. IMPRESSION: Stable appearing nodule in the right lower lobe as described. It measures approximately 4-5 mm in mean diameter on today's exam. This was present on the 2 prior exams dating back to 2018 as well as exam from 2005. The much earlier exam was not available for comparison on prior  studies. Given this long-term stability it is felt to be benign in etiology. No further follow-up is recommended. Changes consistent with prior granulomatous disease. Electronically Signed   By: Inez Catalina M.D.   On: 11/26/2019 08:44    Assessment & Plan:   Adreanne was seen today for hypertension and depression.  Diagnoses and all orders for this visit:  Essential hypertension- She has not achieved her blood pressure goal of 130/80 and she has a significant degree of hypokalemia.  Will increase the dose of carvedilol.  I encouraged her to take the potassium supplement as directed.  We will also start a potassium sparing diuretic. -     Basic metabolic panel; Future -     carvedilol (COREG) 12.5 MG tablet; Take 1 tablet (12.5 mg total) by mouth 2 (two) times daily with a meal. -     Basic metabolic panel -     potassium chloride SA (KLOR-CON) 20 MEQ tablet; Take 1 tablet (20 mEq total) by mouth 2 (two) times daily. -     spironolactone (ALDACTONE) 25 MG tablet; Take 1 tablet (25 mg total) by mouth daily.  Flu vaccine need -     Flu Vaccine QUAD High Dose(Fluad)  Osteoarthritis of  multiple joints, unspecified osteoarthritis type- I have asked her to restart duloxetine since it can help with the pain of osteoarthritis. -     DULoxetine (CYMBALTA) 30 MG capsule; Take 1 capsule (30 mg total) by mouth daily.  Severe episode of recurrent major depressive disorder, without psychotic features ([place])- Will restart duloxetine.  Will gradually increase the dose over time. -     DULoxetine (CYMBALTA) 30 MG capsule; Take 1 capsule (30 mg total) by mouth daily.  Adjustment reaction with anxiety  Panic anxiety syndrome -     clonazePAM (KLONOPIN) 1 MG tablet; Take 1 tablet (1 mg total) by mouth 2 (two) times daily as needed. for anxiety  Diastolic dysfunction without heart failure- Her volume status is normal but her blood pressure is not adequately well controlled.  Will start a potassium sparing diuretic. -     spironolactone (ALDACTONE) 25 MG tablet; Take 1 tablet (25 mg total) by mouth daily.  Diuretic-induced hypokalemia -     potassium chloride SA (KLOR-CON) 20 MEQ tablet; Take 1 tablet (20 mEq total) by mouth 2 (two) times daily. -     spironolactone (ALDACTONE) 25 MG tablet; Take 1 tablet (25 mg total) by mouth daily.   I have discontinued Sagal S. Marszalek's carvedilol. I have also changed her clonazePAM. Additionally, I am having her start on DULoxetine, carvedilol, and spironolactone. Lastly, I am having her maintain her atorvastatin, gabapentin, ARIPiprazole, and potassium chloride SA.  Meds ordered this encounter  Medications  . DULoxetine (CYMBALTA) 30 MG capsule    Sig: Take 1 capsule (30 mg total) by mouth daily.    Dispense:  30 capsule    Refill:  0  . clonazePAM (KLONOPIN) 1 MG tablet    Sig: Take 1 tablet (1 mg total) by mouth 2 (two) times daily as needed. for anxiety    Dispense:  60 tablet    Refill:  3  . carvedilol (COREG) 12.5 MG tablet    Sig: Take 1 tablet (12.5 mg total) by mouth 2 (two) times daily with a meal.    Dispense:  180 tablet     Refill:  0  . potassium chloride SA (KLOR-CON) 20 MEQ tablet    Sig: Take 1 tablet (20 mEq  total) by mouth 2 (two) times daily.    Dispense:  180 tablet    Refill:  0  . spironolactone (ALDACTONE) 25 MG tablet    Sig: Take 1 tablet (25 mg total) by mouth daily.    Dispense:  90 tablet    Refill:  0   I spent 50 minutes in preparing to see the patient by review of recent labs, imaging and procedures, obtaining and reviewing separately obtained history, communicating with the patient and family or caregiver, ordering medications, tests or procedures, and documenting clinical information in the EHR including the differential Dx, treatment, and any further evaluation and other management of 1. Essential hypertension 2. Osteoarthritis of multiple joints, unspecified osteoarthritis type 3. Severe episode of recurrent major depressive disorder, without psychotic features ([place]) 4. Panic anxiety syndrome 5. Diastolic dysfunction without heart failure 6. Diuretic-induced hypokalemia      Follow-up: Return in about 3 months (around 07/07/2020).  Scarlette Calico, MD

## 2020-04-24 ENCOUNTER — Other Ambulatory Visit: Payer: Self-pay | Admitting: Internal Medicine

## 2020-04-24 DIAGNOSIS — I10 Essential (primary) hypertension: Secondary | ICD-10-CM

## 2020-04-26 ENCOUNTER — Other Ambulatory Visit: Payer: Self-pay | Admitting: Internal Medicine

## 2020-04-28 ENCOUNTER — Other Ambulatory Visit: Payer: Self-pay | Admitting: Internal Medicine

## 2020-04-28 DIAGNOSIS — I10 Essential (primary) hypertension: Secondary | ICD-10-CM

## 2020-05-01 ENCOUNTER — Other Ambulatory Visit: Payer: Self-pay | Admitting: Internal Medicine

## 2020-05-01 DIAGNOSIS — F332 Major depressive disorder, recurrent severe without psychotic features: Secondary | ICD-10-CM

## 2020-05-01 DIAGNOSIS — M159 Polyosteoarthritis, unspecified: Secondary | ICD-10-CM

## 2020-05-02 ENCOUNTER — Other Ambulatory Visit: Payer: Self-pay | Admitting: Internal Medicine

## 2020-05-02 DIAGNOSIS — F332 Major depressive disorder, recurrent severe without psychotic features: Secondary | ICD-10-CM

## 2020-05-02 MED ORDER — DULOXETINE HCL 60 MG PO CPEP: 60.0000 mg | ORAL_CAPSULE | Freq: Every day | ORAL | 1 refills | Status: DC

## 2020-06-10 DIAGNOSIS — E860 Dehydration: Secondary | ICD-10-CM | POA: Diagnosis not present

## 2020-06-10 DIAGNOSIS — R509 Fever, unspecified: Secondary | ICD-10-CM | POA: Diagnosis not present

## 2020-06-11 ENCOUNTER — Emergency Department (HOSPITAL_BASED_OUTPATIENT_CLINIC_OR_DEPARTMENT_OTHER): Payer: Medicare HMO

## 2020-06-11 ENCOUNTER — Emergency Department (HOSPITAL_BASED_OUTPATIENT_CLINIC_OR_DEPARTMENT_OTHER)
Admission: EM | Admit: 2020-06-11 | Discharge: 2020-06-12 | Disposition: A | Discharge status: Home / Self Care | Payer: Medicare HMO | Attending: Emergency Medicine | Admitting: Emergency Medicine

## 2020-06-11 ENCOUNTER — Other Ambulatory Visit: Payer: Self-pay

## 2020-06-11 ENCOUNTER — Encounter (HOSPITAL_BASED_OUTPATIENT_CLINIC_OR_DEPARTMENT_OTHER): Payer: Self-pay | Admitting: *Deleted

## 2020-06-11 ENCOUNTER — Telehealth (INDEPENDENT_AMBULATORY_CARE_PROVIDER_SITE_OTHER): Payer: Medicare HMO | Admitting: Family

## 2020-06-11 DIAGNOSIS — K047 Periapical abscess without sinus: Secondary | ICD-10-CM

## 2020-06-11 DIAGNOSIS — K122 Cellulitis and abscess of mouth: Secondary | ICD-10-CM

## 2020-06-11 DIAGNOSIS — E871 Hypo-osmolality and hyponatremia: Secondary | ICD-10-CM | POA: Diagnosis not present

## 2020-06-11 DIAGNOSIS — Z79899 Other long term (current) drug therapy: Secondary | ICD-10-CM | POA: Diagnosis not present

## 2020-06-11 DIAGNOSIS — U071 COVID-19: Secondary | ICD-10-CM | POA: Diagnosis not present

## 2020-06-11 DIAGNOSIS — R509 Fever, unspecified: Secondary | ICD-10-CM | POA: Diagnosis not present

## 2020-06-11 DIAGNOSIS — R110 Nausea: Secondary | ICD-10-CM | POA: Diagnosis not present

## 2020-06-11 DIAGNOSIS — R519 Headache, unspecified: Secondary | ICD-10-CM | POA: Diagnosis present

## 2020-06-11 DIAGNOSIS — R220 Localized swelling, mass and lump, head: Secondary | ICD-10-CM | POA: Diagnosis not present

## 2020-06-11 DIAGNOSIS — I10 Essential (primary) hypertension: Secondary | ICD-10-CM | POA: Insufficient documentation

## 2020-06-11 DIAGNOSIS — H70011 Subperiosteal abscess of mastoid, right ear: Secondary | ICD-10-CM | POA: Diagnosis not present

## 2020-06-11 LAB — CBC WITH DIFFERENTIAL/PLATELET
Abs Immature Granulocytes: 0.03 10*3/uL (ref 0.00–0.07)
Basophils Absolute: 0 10*3/uL (ref 0.0–0.1)
Basophils Relative: 0 %
Eosinophils Absolute: 0 10*3/uL (ref 0.0–0.5)
Eosinophils Relative: 0 %
HCT: 42.1 % (ref 36.0–46.0)
Hemoglobin: 14.4 g/dL (ref 12.0–15.0)
Immature Granulocytes: 0 %
Lymphocytes Relative: 12 %
Lymphs Abs: 1.2 10*3/uL (ref 0.7–4.0)
MCH: 30 pg (ref 26.0–34.0)
MCHC: 34.2 g/dL (ref 30.0–36.0)
MCV: 87.7 fL (ref 80.0–100.0)
Monocytes Absolute: 1.3 10*3/uL — ABNORMAL HIGH (ref 0.1–1.0)
Monocytes Relative: 13 %
Neutro Abs: 7.4 10*3/uL (ref 1.7–7.7)
Neutrophils Relative %: 75 %
Platelets: 294 10*3/uL (ref 150–400)
RBC: 4.8 MIL/uL (ref 3.87–5.11)
RDW: 12.6 % (ref 11.5–15.5)
WBC: 9.9 10*3/uL (ref 4.0–10.5)
nRBC: 0 % (ref 0.0–0.2)

## 2020-06-11 LAB — COMPREHENSIVE METABOLIC PANEL
ALT: 49 U/L — ABNORMAL HIGH (ref 0–44)
AST: 44 U/L — ABNORMAL HIGH (ref 15–41)
Albumin: 3.7 g/dL (ref 3.5–5.0)
Alkaline Phosphatase: 172 U/L — ABNORMAL HIGH (ref 38–126)
Anion gap: 12 (ref 5–15)
BUN: 13 mg/dL (ref 8–23)
CO2: 24 mmol/L (ref 22–32)
Calcium: 8.7 mg/dL — ABNORMAL LOW (ref 8.9–10.3)
Chloride: 88 mmol/L — ABNORMAL LOW (ref 98–111)
Creatinine, Ser: 1.19 mg/dL — ABNORMAL HIGH (ref 0.44–1.00)
GFR, Estimated: 49 mL/min — ABNORMAL LOW (ref >60)
Glucose, Bld: 114 mg/dL — ABNORMAL HIGH (ref 70–99)
Potassium: 4.4 mmol/L (ref 3.5–5.1)
Sodium: 124 mmol/L — ABNORMAL LOW (ref 135–145)
Total Bilirubin: 0.8 mg/dL (ref 0.3–1.2)
Total Protein: 7.6 g/dL (ref 6.5–8.1)

## 2020-06-11 LAB — LACTIC ACID, PLASMA: Lactic Acid, Venous: 1.1 mmol/L (ref 0.5–1.9)

## 2020-06-11 MED ORDER — IOHEXOL 300 MG/ML  SOLN
75.0000 mL | Freq: Once | INTRAMUSCULAR | Status: AC | PRN
  Administered 2020-06-11: 75 mL via INTRAVENOUS

## 2020-06-11 MED ORDER — SODIUM CHLORIDE 0.9 % IV BOLUS
1000.0000 mL | Freq: Once | INTRAVENOUS | Status: AC
  Administered 2020-06-11: 1000 mL via INTRAVENOUS

## 2020-06-11 MED ORDER — KETOROLAC TROMETHAMINE 30 MG/ML IJ SOLN
30.0000 mg | Freq: Once | INTRAMUSCULAR | Status: AC
  Administered 2020-06-12: 30 mg via INTRAVENOUS
  Filled 2020-06-11: qty 1

## 2020-06-11 MED ORDER — ACETAMINOPHEN 325 MG PO TABS
650.0000 mg | ORAL_TABLET | Freq: Once | ORAL | Status: AC
  Administered 2020-06-11: 650 mg via ORAL
  Filled 2020-06-11: qty 2

## 2020-06-11 MED ORDER — TRAMADOL HCL 50 MG PO TABS
50.0000 mg | ORAL_TABLET | Freq: Two times a day (BID) | ORAL | 0 refills | Status: AC | PRN
Start: 2020-06-11 — End: 2020-06-14

## 2020-06-11 MED ORDER — DEXAMETHASONE SODIUM PHOSPHATE 10 MG/ML IJ SOLN
10.0000 mg | Freq: Once | INTRAMUSCULAR | Status: AC
  Administered 2020-06-12: 10 mg via INTRAVENOUS
  Filled 2020-06-11: qty 1

## 2020-06-11 MED ORDER — ONDANSETRON HCL 4 MG PO TABS: 4.0000 mg | ORAL_TABLET | Freq: Three times a day (TID) | ORAL | 0 refills | Status: DC | PRN

## 2020-06-11 MED ORDER — ONDANSETRON HCL 4 MG/2ML IJ SOLN
4.0000 mg | Freq: Once | INTRAMUSCULAR | Status: AC
  Administered 2020-06-12: 4 mg via INTRAVENOUS
  Filled 2020-06-11: qty 2

## 2020-06-11 MED ORDER — IBUPROFEN 800 MG PO TABS
800.0000 mg | ORAL_TABLET | Freq: Once | ORAL | Status: AC
  Administered 2020-06-11: 800 mg via ORAL
  Filled 2020-06-11: qty 1

## 2020-06-11 NOTE — ED Provider Notes (Signed)
[facility] EMER[facility]ENCY DEPARTMENT Provider Note   CSN: [id number] Arrival date & time: 06/11/20  1843     History Chief Complaint  Patient presents with  . Headache  . Fever    Vicki Martin is a 70 y.o. female.  HPI   70 year old female presents to the emergency department with concern for right-sided dental abscess that is getting worse.  Patient states this started about a week ago, she was evaluated by a dentist.  They were not able to remove the tooth due to infection/abscess today placed her on clindamycin.  She is taking this for the past 3 days.  However the pain from the right tooth has gotten worse and now she is having swelling of the right cheek, underneath her chin and the front of her throat.  It is painful to swallow.  She has had no difficulty breathing or drooling but states her throat feels swollen.  Patient is vaccinated for flu and COVID, no known recent sick contacts.  She has had nausea.  Past Medical History:  Diagnosis Date  . Anxiety   . Arthritis   . Depression   . Family history of pancreatic cancer   . Family history of stomach cancer   . GERD (gastroesophageal reflux disease)   . Heart murmur   . Hyperlipidemia    no medications  . Hypertension   . Migraines   . Sleep apnea    pt declines CPAP    Patient Active Problem List   Diagnosis Date Noted  . Flu vaccine need 04/06/2020  . Panic anxiety syndrome 04/06/2020  . Diastolic dysfunction without heart failure 04/06/2020  . Severe episode of recurrent major depressive disorder, without psychotic features ([place]) 12/19/2019  . Diuretic-induced hypokalemia 11/19/2019  . 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 Restless legs syndrome (RLS) 12/11/2016  . Hypertriglyceridemia 08/05/2015  . Routine general medical examination at a health care facility 08/04/2015  . Gastroesophageal reflux disease with esophagitis 08/04/2015  . Systolic murmur 62/95/2841  . Hyperlipidemia  08/04/2015  . Left bundle branch block (LBBB) 08/04/2015  . Essential hypertension 07/26/2006  . Osteoarthritis 07/26/2006    Past Surgical History:  Procedure Laterality Date  . ABDOMINAL HYSTERECTOMY     reports complet but with ovaries retained  . CHOLECYSTECTOMY    . removed hip bone to replace vertebrae       OB History   No obstetric history on file.     Family History  Problem Relation Age of Onset  . Rheum arthritis Mother   . Stomach cancer Mother 33  . Pancreatic cancer Mother 65  . Stomach cancer Maternal Aunt   . Pancreatic cancer Maternal Aunt   . Heart disease Father   . Hypertension Sister   . Lung cancer Brother        deceased  . Early death Brother   . Liver cancer Brother   . Hypertension Brother   . COPD Brother   . Cancer Maternal Uncle        NOS  . Cancer Maternal Aunt        NOS  . Lung cancer Other   . Ovarian cysts Daughter   . Diabetes Neg Hx   . Alcohol abuse Neg Hx   . Stroke Neg Hx   . Colon cancer Neg Hx   . Esophageal cancer Neg Hx   . Rectal cancer Neg Hx     Social History   Tobacco Use  .  Smoking status: Never Smoker  . Smokeless tobacco: Never Used  Vaping Use  . Vaping Use: Never used  Substance Use Topics  . Alcohol use: No    Alcohol/week: 0.0 standard drinks  . Drug use: No    Home Medications Prior to Admission medications   Medication Sig Start Date End Date Taking? Authorizing Provider  ARIPiprazole (ABILIFY) 5 MG tablet Take 1 tablet (5 mg total) by mouth daily. 03/08/20   Janith Lima, MD  atorvastatin (LIPITOR) 40 MG tablet Take 1 tablet (40 mg total) by mouth daily. Patient taking differently: Take 40 mg by mouth daily.  11/19/19   Janith Lima, MD  carvedilol (COREG) 12.5 MG tablet Take 1 tablet (12.5 mg total) by mouth 2 (two) times daily with a meal. 04/06/20   Janith Lima, MD  clonazePAM (KLONOPIN) 1 MG tablet Take 1 tablet (1 mg total) by mouth 2 (two) times daily as needed. for anxiety  04/06/20   Janith Lima, MD  DULoxetine (CYMBALTA) 60 MG capsule Take 1 capsule (60 mg total) by mouth daily. 05/02/20   Janith Lima, MD  gabapentin (NEURONTIN) 300 MG capsule Take 1 capsule by mouth at bedtime 02/13/20   Janith Lima, MD  potassium chloride SA (KLOR-CON) 20 MEQ tablet Take 1 tablet (20 mEq total) by mouth 2 (two) times daily. 04/06/20   Janith Lima, MD  spironolactone (ALDACTONE) 25 MG tablet Take 1 tablet (25 mg total) by mouth daily. 04/06/20   Janith Lima, MD    Allergies    Codeine and Penicillins  Review of Systems   Review of Systems  Constitutional: Positive for chills and fever.  HENT: Positive for dental problem, sore throat and trouble swallowing. Negative for congestion, drooling and voice change.   Eyes: Negative for visual disturbance.  Respiratory: Negative for shortness of breath.   Cardiovascular: Negative for chest pain.  Gastrointestinal: Negative for abdominal pain, diarrhea and vomiting.  Genitourinary: Negative for dysuria.  Skin: Negative for rash.  Neurological: Positive for headaches.    Physical Exam Updated Vital Signs BP 127/64 (BP Location: Right Arm)   Pulse 78   Temp (!) 101.4 F (38.6 C) (Oral)   Resp 20   SpO2 95%   Physical Exam Vitals and nursing note reviewed.  Constitutional:      Appearance: Normal appearance.  HENT:     Head: Normocephalic.     Mouth/Throat:     Mouth: Mucous membranes are moist.   Neck:     Comments: Patient has swelling of the right cheek extending to the right submandibular area and down the front of the neck with some redness, warmth, + submandibular cervical adenopathy on the right with swelling along the front part of the throat, no stridor, no drooling, patient is able to swallow Cardiovascular:     Rate and Rhythm: Normal rate.  Pulmonary:     Effort: Pulmonary effort is normal. No respiratory distress.  Abdominal:     Palpations: Abdomen is soft.     Tenderness: There is no  abdominal tenderness.  Skin:    General: Skin is warm.  Neurological:     Mental Status: She is alert and oriented to person, place, and time. Mental status is at baseline.  Psychiatric:        Mood and Affect: Mood normal.     ED Results / Procedures / Treatments   Labs (all labs ordered are listed, but only abnormal results are displayed)  Labs Reviewed  CBC WITH DIFFERENTIAL/PLATELET - Abnormal; Notable for the following components:      Result Value   Monocytes Absolute 1.3 (*)    All other components within normal limits  COMPREHENSIVE METABOLIC PANEL - Abnormal; Notable for the following components:   Sodium 124 (*)    Chloride 88 (*)    Glucose, Bld 114 (*)    Creatinine, Ser 1.19 (*)    Calcium 8.7 (*)    AST 44 (*)    ALT 49 (*)    Alkaline Phosphatase 172 (*)    GFR, Estimated 49 (*)    All other components within normal limits  LACTIC ACID, PLASMA  LACTIC ACID, PLASMA    EKG None  Radiology No results found.  Procedures Procedures (including critical care time)  Medications Ordered in ED Medications  ibuprofen (ADVIL) tablet 800 mg (800 mg Oral Given 06/11/20 1907)  acetaminophen (TYLENOL) tablet 650 mg (650 mg Oral Given 06/11/20 2142)    ED Course  I have reviewed the triage vital signs and the nursing notes.  Pertinent labs & imaging results that were available during my care of the patient were reviewed by me and considered in my medical decision making (see chart for details).    MDM Rules/Calculators/A&P                          Patient was febrile on arrival, temperature did respond to antipyretic.  Blood work is reassuring without a significant leukocytosis.  CAT scan identifies a subperiosteal abscess in the right trigone most likely extending from the right lower molar.  Lactic acid was negative, no signs of sepsis.  Of note she had mild hyponatremia most likely from dehydration and decreased p.o. intake today.  Patient was given IV fluids.   Patient feels significantly better after steroids and pain medicine.  I spoke with on-call dentist Dr. Haig Prophet who recommends continued antibiotic therapy and ultimately having the tooth pulled and infected area drained.  She is only on day 3 of her clindamycin, with no signs of sepsis and the patient tolerating p.o. we believe continued outpatient therapy and completing her antibiotic regimen is appropriate.  Patient is feeling significantly improved, she is able to tolerate pills and fluids.  She feels significantly better, she chooses to go home and complete her antibiotic regimen with strict return to ED instructions knowing that if anything worsens potential admission IV antibiotics would be the next step.  Family at bedside understand and agree discharge plan.  Patient will be discharged and treated as an outpatient.  Discharge plan and strict return to ED precautions discussed, patient verbalizes understanding and agreement.  Of note patient has a codeine allergy listed but she has taken tramadol before in the past without any issue.  There could be an interaction of tramadol with duloxetine but she is only been given a couple pills for breakthrough pain. Final Clinical Impression(s) / ED Diagnoses Final diagnoses:  None    Rx / DC Orders ED Discharge Orders    None       Lorelle Gibbs, DO 06/11/20 2348

## 2020-06-11 NOTE — ED Triage Notes (Addendum)
C/o fever , bodyaches , dizziness x 3 days , taking ABx for dental abscess , tylenol x 3 hrs ago

## 2020-06-11 NOTE — ED Notes (Signed)
Pt transported to CT ?

## 2020-06-11 NOTE — Discharge Instructions (Addendum)
You have been seen and discharged from the emergency department.  You have a dental infection.  Continue taking antibiotics as prescribed.  Take Tylenol and ibuprofen as directed for fever/pain control.  You may also take tramadol as needed for breakthrough/severe pain.  Follow-up with your dentist on Monday, the tooth and infected area will need to be pulled/drained.  Your sodium was slightly low today, most likely due to dehydration, you were given IV fluids in the department.  Stay well-hydrated, take nausea medicine as needed.  Take home medications as prescribed. If you have any worsening symptoms or further concerns for health please return to an emergency department for further evaluation.

## 2020-06-11 NOTE — Progress Notes (Signed)
Vicki Martin is a 70 y.o. female with the following history as recorded in [employer]:  Patient Active Problem List   Diagnosis Date Noted  . Flu vaccine need 04/06/2020  . Panic anxiety syndrome 04/06/2020  . Diastolic dysfunction without heart failure 04/06/2020  . Severe episode of recurrent major depressive disorder, without psychotic features ([place]) 12/19/2019  . Diuretic-induced hypokalemia 11/19/2019  . 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 Restless legs syndrome (RLS) 12/11/2016  . Hypertriglyceridemia 08/05/2015  . Routine general medical examination at a health care facility 08/04/2015  . Gastroesophageal reflux disease with esophagitis 08/04/2015  . Systolic murmur 56/21/3086  . Hyperlipidemia 08/04/2015  . Left bundle branch block (LBBB) 08/04/2015  . Essential hypertension 07/26/2006  . Osteoarthritis 07/26/2006    Current Outpatient Medications  Medication Sig Dispense Refill  . ARIPiprazole (ABILIFY) 5 MG tablet Take 1 tablet (5 mg total) by mouth daily. 90 tablet 1  . atorvastatin (LIPITOR) 40 MG tablet Take 1 tablet (40 mg total) by mouth daily. (Patient taking differently: Take 40 mg by mouth daily. ) 90 tablet 1  . carvedilol (COREG) 12.5 MG tablet Take 1 tablet (12.5 mg total) by mouth 2 (two) times daily with a meal. 180 tablet 0  . clonazePAM (KLONOPIN) 1 MG tablet Take 1 tablet (1 mg total) by mouth 2 (two) times daily as needed. for anxiety 60 tablet 3  . DULoxetine (CYMBALTA) 60 MG capsule Take 1 capsule (60 mg total) by mouth daily. 90 capsule 1  . gabapentin (NEURONTIN) 300 MG capsule Take 1 capsule by mouth at bedtime 90 capsule 1  . potassium chloride SA (KLOR-CON) 20 MEQ tablet Take 1 tablet (20 mEq total) by mouth 2 (two) times daily. 180 tablet 0  . spironolactone (ALDACTONE) 25 MG tablet Take 1 tablet (25 mg total) by mouth daily. 90 tablet 0   No current facility-administered medications for this visit.    Allergies: Codeine and  Penicillins  Past Medical History:  Diagnosis Date  . Anxiety   . Arthritis   . Depression   . Family history of pancreatic cancer   . Family history of stomach cancer   . GERD (gastroesophageal reflux disease)   . Heart murmur   . Hyperlipidemia    no medications  . Hypertension   . Migraines   . Sleep apnea    pt declines CPAP    Past Surgical History:  Procedure Laterality Date  . ABDOMINAL HYSTERECTOMY     reports complet but with ovaries retained  . CHOLECYSTECTOMY    . removed hip bone to replace vertebrae      Family History  Problem Relation Age of Onset  . Rheum arthritis Mother   . Stomach cancer Mother 83  . Pancreatic cancer Mother 39  . Stomach cancer Maternal Aunt   . Pancreatic cancer Maternal Aunt   . Heart disease Father   . Hypertension Sister   . Lung cancer Brother        deceased  . Early death Brother   . Liver cancer Brother   . Hypertension Brother   . COPD Brother   . Cancer Maternal Uncle        NOS  . Cancer Maternal Aunt        NOS  . Lung cancer Other   . Ovarian cysts Daughter   . Diabetes Neg Hx   . Alcohol abuse Neg Hx   . Stroke Neg Hx   . Colon cancer  Neg Hx   . Esophageal cancer Neg Hx   . Rectal cancer Neg Hx     Social History   Tobacco Use  . Smoking status: Never Smoker  . Smokeless tobacco: Never Used  Substance Use Topics  . Alcohol use: No    Alcohol/week: 0.0 standard drinks    Subjective:   I connected with Kirstan S Menzer on 06/11/20 at  1:40 PM EST by a telephone cal and verified that I am speaking with the correct person using two identifiers.   I discussed the limitations of evaluation and management by telemedicine and the availability of in person appointments. The patient expressed understanding and agreed to proceed. Provider in office/ patient is at home; provider and patien, patient's daughter are only 2 people on telephone call.    Patient was seen at her dentist on Tuesday with broken bridge  in lower left side of her mouth; was started on Cleocin; per daughter and patient, symptoms have only continued to worsen over the week- concern for something other than the dental infection; patient was seen at [facility] last night and recommended to go to ER for further evaluation of symptoms and possible dehydration; was given something for nausea at [facility] but no labs or CXR done; per daughter, patient "was soaked" last night with sweats;      Objective:  There were no vitals filed for this visit.  Lungs: Respirations unlabored;  Neurologic: Alert and oriented; speech intact;   Assessment:  1. Oral infection   2. Fever, unspecified fever cause   3. Nausea     Plan:  Based on description of symptoms, am concerned that patient could be septic; discussed with daughter and family need for in person evaluation and possible IV fluids/ IV antibiotics; they express understanding;  Time spent 12 minutes  No follow-ups on file.  No orders of the defined types were placed in this encounter.   Requested Prescriptions    No prescriptions requested or ordered in this encounter

## 2020-06-12 LAB — SARS CORONAVIRUS 2 (TAT 6-24 HRS): SARS Coronavirus 2: POSITIVE — AB

## 2020-06-12 NOTE — ED Notes (Signed)
Patient verbalizes understanding of discharge instructions. Opportunity for questioning and answers were provided. Armband removed by staff, pt discharged from ED ambulatory to home.  

## 2020-07-07 ENCOUNTER — Ambulatory Visit (INDEPENDENT_AMBULATORY_CARE_PROVIDER_SITE_OTHER): Payer: Medicare HMO | Admitting: Internal Medicine

## 2020-07-07 ENCOUNTER — Other Ambulatory Visit: Payer: Self-pay

## 2020-07-07 ENCOUNTER — Encounter: Payer: Self-pay | Admitting: Internal Medicine

## 2020-07-07 VITALS — BP 122/78 | HR 65 | Temp 97.5°F | Resp 18 | Ht 61.0 in | Wt 143.8 lb

## 2020-07-07 DIAGNOSIS — R7989 Other specified abnormal findings of blood chemistry: Secondary | ICD-10-CM | POA: Diagnosis not present

## 2020-07-07 DIAGNOSIS — E871 Hypo-osmolality and hyponatremia: Secondary | ICD-10-CM | POA: Insufficient documentation

## 2020-07-07 DIAGNOSIS — R739 Hyperglycemia, unspecified: Secondary | ICD-10-CM | POA: Insufficient documentation

## 2020-07-07 DIAGNOSIS — U071 COVID-19: Secondary | ICD-10-CM | POA: Insufficient documentation

## 2020-07-07 LAB — HEPATIC FUNCTION PANEL
ALT: 25 U/L (ref 0–35)
AST: 26 U/L (ref 0–37)
Albumin: 4.2 g/dL (ref 3.5–5.2)
Alkaline Phosphatase: 113 U/L (ref 39–117)
Bilirubin, Direct: 0.1 mg/dL (ref 0.0–0.3)
Total Bilirubin: 0.5 mg/dL (ref 0.2–1.2)
Total Protein: 7.1 g/dL (ref 6.0–8.3)

## 2020-07-07 LAB — CBC WITH DIFFERENTIAL/PLATELET
Basophils Absolute: 0.1 10*3/uL (ref 0.0–0.1)
Basophils Relative: 1.5 % (ref 0.0–3.0)
Eosinophils Absolute: 0.4 10*3/uL (ref 0.0–0.7)
Eosinophils Relative: 5.6 % — ABNORMAL HIGH (ref 0.0–5.0)
HCT: 38.3 % (ref 36.0–46.0)
Hemoglobin: 13.2 g/dL (ref 12.0–15.0)
Lymphocytes Relative: 30.4 % (ref 12.0–46.0)
Lymphs Abs: 2 10*3/uL (ref 0.7–4.0)
MCHC: 34.5 g/dL (ref 30.0–36.0)
MCV: 89.7 fl (ref 78.0–100.0)
Monocytes Absolute: 0.7 10*3/uL (ref 0.1–1.0)
Monocytes Relative: 10 % (ref 3.0–12.0)
Neutro Abs: 3.5 10*3/uL (ref 1.4–7.7)
Neutrophils Relative %: 52.5 % (ref 43.0–77.0)
Platelets: 295 10*3/uL (ref 150.0–400.0)
RBC: 4.27 Mil/uL (ref 3.87–5.11)
RDW: 13.7 % (ref 11.5–15.5)
WBC: 6.6 10*3/uL (ref 4.0–10.5)

## 2020-07-07 LAB — BASIC METABOLIC PANEL
BUN: 16 mg/dL (ref 6–23)
CO2: 24 mEq/L (ref 19–32)
Calcium: 9.3 mg/dL (ref 8.4–10.5)
Chloride: 100 mEq/L (ref 96–112)
Creatinine, Ser: 1.13 mg/dL (ref 0.40–1.20)
GFR: 49.5 mL/min — ABNORMAL LOW (ref >60.00)
Glucose, Bld: 103 mg/dL — ABNORMAL HIGH (ref 70–99)
Potassium: 4.6 mEq/L (ref 3.5–5.1)
Sodium: 133 mEq/L — ABNORMAL LOW (ref 135–145)

## 2020-07-07 LAB — HEMOGLOBIN A1C: Hgb A1c MFr Bld: 6 % (ref 4.6–6.5)

## 2020-07-07 LAB — CORTISOL: Cortisol, Plasma: 11.2 ug/dL

## 2020-07-07 NOTE — Patient Instructions (Signed)

## 2020-07-07 NOTE — Progress Notes (Signed)
Subjective:  Patient ID: Vicki Martin, female    DOB: 06-Dec-1950  Age: 70 y.o. MRN: [id number]  CC: Follow-up  This visit occurred during the SARS-CoV-2 public health emergency.  Safety protocols were in place, including screening questions prior to the visit, additional usage of staff PPE, and extensive cleaning of exam room while observing appropriate contact time as indicated for disinfecting solutions.    HPI Vicki Martin presents for f/up - She was treated about a month ago for COVID-19 and a dental infection.  She tells me her symptoms have resolved.  This was all complicated by hyponatremia and elevated liver enzymes.  Outpatient Medications Prior to Visit  Medication Sig Dispense Refill  . ARIPiprazole (ABILIFY) 5 MG tablet Take 1 tablet (5 mg total) by mouth daily. 90 tablet 1  . atorvastatin (LIPITOR) 40 MG tablet Take 1 tablet (40 mg total) by mouth daily. (Patient taking differently: Take 40 mg by mouth daily.) 90 tablet 1  . carvedilol (COREG) 12.5 MG tablet Take 1 tablet (12.5 mg total) by mouth 2 (two) times daily with a meal. 180 tablet 0  . clonazePAM (KLONOPIN) 1 MG tablet Take 1 tablet (1 mg total) by mouth 2 (two) times daily as needed. for anxiety 60 tablet 3  . gabapentin (NEURONTIN) 300 MG capsule Take 1 capsule by mouth at bedtime 90 capsule 1  . potassium chloride SA (KLOR-CON) 20 MEQ tablet Take 1 tablet (20 mEq total) by mouth 2 (two) times daily. 180 tablet 0  . spironolactone (ALDACTONE) 25 MG tablet Take 1 tablet (25 mg total) by mouth daily. 90 tablet 0  . DULoxetine (CYMBALTA) 60 MG capsule Take 1 capsule (60 mg total) by mouth daily. (Patient not taking: Reported on 07/07/2020) 90 capsule 1  . ondansetron (ZOFRAN) 4 MG tablet Take 1 tablet (4 mg total) by mouth every 8 (eight) hours as needed for nausea or vomiting. (Patient not taking: Reported on 07/07/2020) 4 tablet 0   No facility-administered medications prior to visit.    ROS Review of Systems   Constitutional: Negative for appetite change, chills, diaphoresis, fatigue, fever and unexpected weight change.  Eyes: Negative.   Respiratory: Negative for cough, chest tightness, shortness of breath and wheezing.   Cardiovascular: Negative for chest pain, palpitations and leg swelling.  Gastrointestinal: Negative for abdominal pain, constipation, diarrhea, nausea and vomiting.  Endocrine: Negative.   Genitourinary: Negative.  Negative for difficulty urinating.  Musculoskeletal: Negative for arthralgias and myalgias.  Skin: Negative for color change.  Neurological: Negative.  Negative for dizziness, weakness and light-headedness.  Hematological: Negative for adenopathy. Does not bruise/bleed easily.  Psychiatric/Behavioral: Negative.     Objective:  BP 122/78   Pulse 65   Temp (!) 97.5 F (36.4 C) (Oral)   Resp 18   Ht 5\' 1"  (1.549 m)   Wt 143 lb 12.8 oz (65.2 kg)   SpO2 96%   BMI 27.17 kg/m   BP Readings from Last 3 Encounters:  07/07/20 122/78  06/12/20 113/64  04/06/20 (!) 144/86    Wt Readings from Last 3 Encounters:  07/07/20 143 lb 12.8 oz (65.2 kg)  04/06/20 143 lb (64.9 kg)  02/03/20 143 lb 3.2 oz (65 kg)    Physical Exam Vitals reviewed.  Constitutional:      Appearance: Normal appearance.  HENT:     Nose: Nose normal.     Mouth/Throat:     Mouth: Mucous membranes are moist.  Eyes:     General: No  scleral icterus.    Conjunctiva/sclera: Conjunctivae normal.  Cardiovascular:     Rate and Rhythm: Normal rate and regular rhythm.     Heart sounds: No murmur heard.   Pulmonary:     Effort: Pulmonary effort is normal.     Breath sounds: No stridor. No wheezing, rhonchi or rales.  Abdominal:     General: Abdomen is flat. Bowel sounds are normal. There is no distension.     Palpations: Abdomen is soft. There is no hepatomegaly, splenomegaly or mass.  Musculoskeletal:        General: Normal range of motion.     Cervical back: Neck supple.     Right  lower leg: No edema.     Left lower leg: No edema.  Lymphadenopathy:     Cervical: No cervical adenopathy.  Skin:    General: Skin is warm and dry.     Coloration: Skin is not pale.  Neurological:     General: No focal deficit present.     Mental Status: She is alert.  Psychiatric:        Mood and Affect: Mood normal.        Behavior: Behavior normal.     Lab Results  Component Value Date   WBC 6.6 07/07/2020   HGB 13.2 07/07/2020   HCT 38.3 07/07/2020   PLT 295.0 07/07/2020   GLUCOSE 103 (H) 07/07/2020   CHOL 112 11/18/2019   TRIG 94.0 11/18/2019   HDL 46.30 11/18/2019   LDLDIRECT 40.0 10/24/2018   LDLCALC 47 11/18/2019   ALT 25 07/07/2020   AST 26 07/07/2020   NA 133 (L) 07/07/2020   K 4.6 07/07/2020   CL 100 07/07/2020   CREATININE 1.13 07/07/2020   BUN 16 07/07/2020   CO2 24 07/07/2020   TSH 2.52 11/18/2019   HGBA1C 6.0 07/07/2020    CT Soft Tissue Neck W Contrast  Result Date: 06/11/2020 CLINICAL DATA:  Right neck swelling. EXAM: CT NECK WITH CONTRAST TECHNIQUE: Multidetector CT imaging of the neck was performed using the standard protocol following the bolus administration of intravenous contrast. CONTRAST:  61mL OMNIPAQUE IOHEXOL 300 MG/ML  SOLN COMPARISON:  None. FINDINGS: Pharynx and larynx: There is fullness of the right submandibular space with a low-attenuation collection at the level of the angle of the mandible (5:38), likely a subperiosteal abscess. The source is likely the carious most posterior remaining right mandibular molar. Salivary glands: Right submandibular gland is enlarged. No sialolithiasis. Parotid and sublingual glands are normal. Thyroid: Normal Lymph nodes: Multiple right level 1B and level 2A lymph nodes which are likely reactive. Vascular: Negative. Limited intracranial: Negative. Visualized orbits: Negative. Mastoids and visualized paranasal sinuses: Clear. Skeleton: No acute or aggressive process. Upper chest: Negative. Other: Lower right  facial soft tissue swelling with inflammatory induration and thickening of the platysma. IMPRESSION: 1. Subperiosteal abscess of the right retromolar trigone, likely arising from carious most posterior remaining right mandibular molar. 2. Reactive right level 1B and level 2A lymphadenopathy. Reactive swelling of the right submandibular gland. Electronically Signed   By: Ulyses Jarred M.D.   On: 06/11/2020 22:28    Assessment & Plan:   Katieann was seen today for follow-up.  Diagnoses and all orders for this visit:  COVID-19 virus infection- By all accounts this has resolved. -     CBC with Differential/Platelet; Future -     CBC with Differential/Platelet  Hyperglycemia- Her A1c is at 6.0%.  She is prediabetic.  Medical therapy is not indicated. -  Basic metabolic panel; Future -     Hemoglobin A1c; Future -     Hemoglobin A1c -     Basic metabolic panel  Acute hyponatremia- Her sodium level has improved but remains low.  I recommended that she discontinue spironolactone for now. -     Cortisol; Future -     Cortisol  Elevated LFTs- Her LFTs have improved.  This was likely caused by the COVID-19 infection. -     Hepatic function panel; Future -     Hepatic function panel   I have discontinued Taleya S. Arscott's spironolactone and ondansetron. I am also having her maintain her atorvastatin, gabapentin, ARIPiprazole, clonazePAM, carvedilol, potassium chloride SA, and DULoxetine.  No orders of the defined types were placed in this encounter.    Follow-up: Return in about 3 months (around 10/04/2020).  Scarlette Calico, MD

## 2020-07-21 ENCOUNTER — Telehealth: Payer: Self-pay | Admitting: Internal Medicine

## 2020-07-21 NOTE — Telephone Encounter (Signed)
Called pt to schedule AWV with [facility]. But was not able to LVM because [facility] was full.

## 2020-07-27 ENCOUNTER — Other Ambulatory Visit: Payer: Self-pay | Admitting: Internal Medicine

## 2020-07-27 DIAGNOSIS — T502X5A Adverse effect of carbonic-anhydrase inhibitors, benzothiadiazides and other diuretics, initial encounter: Secondary | ICD-10-CM

## 2020-07-27 DIAGNOSIS — I10 Essential (primary) hypertension: Secondary | ICD-10-CM

## 2020-07-27 DIAGNOSIS — E876 Hypokalemia: Secondary | ICD-10-CM

## 2020-08-02 ENCOUNTER — Ambulatory Visit: Payer: Medicare HMO | Admitting: Cardiovascular Disease

## 2020-08-16 ENCOUNTER — Other Ambulatory Visit: Payer: Self-pay | Admitting: Internal Medicine

## 2020-08-16 DIAGNOSIS — I10 Essential (primary) hypertension: Secondary | ICD-10-CM

## 2020-09-03 ENCOUNTER — Other Ambulatory Visit: Payer: Self-pay | Admitting: Internal Medicine

## 2020-09-03 DIAGNOSIS — G2581 Restless legs syndrome: Secondary | ICD-10-CM

## 2020-10-17 ENCOUNTER — Other Ambulatory Visit: Payer: Self-pay | Admitting: Internal Medicine

## 2020-10-17 DIAGNOSIS — F332 Major depressive disorder, recurrent severe without psychotic features: Secondary | ICD-10-CM

## 2020-11-07 ENCOUNTER — Other Ambulatory Visit: Payer: Self-pay | Admitting: Internal Medicine

## 2020-11-07 DIAGNOSIS — E876 Hypokalemia: Secondary | ICD-10-CM

## 2020-11-07 DIAGNOSIS — I10 Essential (primary) hypertension: Secondary | ICD-10-CM

## 2020-11-17 ENCOUNTER — Other Ambulatory Visit: Payer: Self-pay | Admitting: Internal Medicine

## 2020-11-17 DIAGNOSIS — F332 Major depressive disorder, recurrent severe without psychotic features: Secondary | ICD-10-CM

## 2020-11-20 ENCOUNTER — Other Ambulatory Visit: Payer: Self-pay | Admitting: Internal Medicine

## 2020-11-20 DIAGNOSIS — I10 Essential (primary) hypertension: Secondary | ICD-10-CM

## 2020-12-24 DIAGNOSIS — Z1231 Encounter for screening mammogram for malignant neoplasm of breast: Secondary | ICD-10-CM | POA: Diagnosis not present

## 2020-12-24 LAB — HM MAMMOGRAPHY

## 2021-02-14 ENCOUNTER — Other Ambulatory Visit: Payer: Self-pay | Admitting: Internal Medicine

## 2021-02-14 DIAGNOSIS — E876 Hypokalemia: Secondary | ICD-10-CM

## 2021-02-14 DIAGNOSIS — I10 Essential (primary) hypertension: Secondary | ICD-10-CM

## 2021-02-18 DIAGNOSIS — R928 Other abnormal and inconclusive findings on diagnostic imaging of breast: Secondary | ICD-10-CM | POA: Diagnosis not present

## 2021-02-18 DIAGNOSIS — R922 Inconclusive mammogram: Secondary | ICD-10-CM | POA: Diagnosis not present

## 2021-02-18 LAB — HM MAMMOGRAPHY

## 2021-02-22 ENCOUNTER — Other Ambulatory Visit: Payer: Self-pay | Admitting: Internal Medicine

## 2021-02-22 DIAGNOSIS — F332 Major depressive disorder, recurrent severe without psychotic features: Secondary | ICD-10-CM

## 2021-02-23 ENCOUNTER — Other Ambulatory Visit: Payer: Self-pay | Admitting: Internal Medicine

## 2021-02-23 DIAGNOSIS — I10 Essential (primary) hypertension: Secondary | ICD-10-CM

## 2021-03-13 ENCOUNTER — Other Ambulatory Visit: Payer: Self-pay | Admitting: Internal Medicine

## 2021-03-13 DIAGNOSIS — G2581 Restless legs syndrome: Secondary | ICD-10-CM

## 2021-03-24 ENCOUNTER — Ambulatory Visit (INDEPENDENT_AMBULATORY_CARE_PROVIDER_SITE_OTHER): Payer: Medicare HMO

## 2021-03-24 ENCOUNTER — Other Ambulatory Visit: Payer: Self-pay

## 2021-03-24 VITALS — BP 118/60 | HR 77 | Temp 97.5°F | Ht 61.0 in | Wt 146.2 lb

## 2021-03-24 DIAGNOSIS — Z23 Encounter for immunization: Secondary | ICD-10-CM

## 2021-03-24 DIAGNOSIS — Z0000 Encounter for general adult medical examination without abnormal findings: Secondary | ICD-10-CM

## 2021-03-24 NOTE — Patient Instructions (Addendum)
[name] , Thank you for taking time to come for your Medicare Wellness Visit. I appreciate your ongoing commitment to your health goals. Please review the following plan we discussed and let me know if I can assist you in the future.   Screening recommendations/referrals: Colonoscopy: 09/20/2015; due every 10 years  Mammogram: 02/18/2021; due every 1-2 years Bone Density: 12/14/2016; due every 5 years Recommended yearly ophthalmology/optometry visit for glaucoma screening and checkup Recommended yearly dental visit for hygiene and checkup  Vaccinations: Influenza vaccine: 03/24/2021 Pneumococcal vaccine: 12/11/2016, 01/08/2018 Tdap vaccine: 08/04/2015; due every 10 years Shingles vaccine: 01/24/2021; next dose due end of October Covid-19: 09/02/2019, 09/23/2019  Advanced directives: Please bring a copy of your health care power of attorney and living will to the office at your convenience.  Conditions/risks identified: Yes; Client understands the importance of follow-up with providers by attending scheduled visits and discussed goals to eat healthier, increase physical activity, exercise the brain, socialize more, get enough sleep and make time for laughter.  Next appointment: Please schedule your next Medicare Wellness Visit with your Nurse Health Advisor in 1 year by calling [phone].   Preventive Care 31 Years and Older, Female Preventive care refers to lifestyle choices and visits with your health care provider that can promote health and wellness. What does preventive care include? A yearly physical exam. This is also called an annual well check. Dental exams once or twice a year. Routine eye exams. Ask your health care provider how often you should have your eyes checked. Personal lifestyle choices, including: Daily care of your teeth and gums. Regular physical activity. Eating a healthy diet. Avoiding tobacco and drug use. Limiting alcohol use. Practicing safe sex. Taking  low-dose aspirin every day. Taking vitamin and mineral supplements as recommended by your health care provider. What happens during an annual well check? The services and screenings done by your health care provider during your annual well check will depend on your age, overall health, lifestyle risk factors, and family history of disease. Counseling  Your health care provider may ask you questions about your: Alcohol use. Tobacco use. Drug use. Emotional well-being. Home and relationship well-being. Sexual activity. Eating habits. History of falls. Memory and ability to understand (cognition). Work and work Statistician. Reproductive health. Screening  You may have the following tests or measurements: Height, weight, and BMI. Blood pressure. Lipid and cholesterol levels. These may be checked every 5 years, or more frequently if you are over 37 years old. Skin check. Lung cancer screening. You may have this screening every year starting at age 37 if you have a 30-pack-year history of smoking and currently smoke or have quit within the past 15 years. Fecal occult blood test (FOBT) of the stool. You may have this test every year starting at age 37. Flexible sigmoidoscopy or colonoscopy. You may have a sigmoidoscopy every 5 years or a colonoscopy every 10 years starting at age 60. Hepatitis C blood test. Hepatitis B blood test. Sexually transmitted disease (STD) testing. Diabetes screening. This is done by checking your blood sugar (glucose) after you have not eaten for a while (fasting). You may have this done every 1-3 years. Bone density scan. This is done to screen for osteoporosis. You may have this done starting at age 42. Mammogram. This may be done every 1-2 years. Talk to your health care provider about how often you should have regular mammograms. Talk with your health care provider about your test results, treatment options, and if necessary, the  need for more tests. Vaccines   Your health care provider may recommend certain vaccines, such as: Influenza vaccine. This is recommended every year. Tetanus, diphtheria, and acellular pertussis (Tdap, Td) vaccine. You may need a Td booster every 10 years. Zoster vaccine. You may need this after age 82. Pneumococcal 13-valent conjugate (PCV13) vaccine. One dose is recommended after age 61. Pneumococcal polysaccharide (PPSV23) vaccine. One dose is recommended after age 70. Talk to your health care provider about which screenings and vaccines you need and how often you need them. This information is not intended to replace advice given to you by your health care provider. Make sure you discuss any questions you have with your health care provider. Document Released: 06/11/2015 Document Revised: 02/02/2016 Document Reviewed: 03/16/2015 Elsevier Interactive Patient Education  2017 [place] Prevention in the Home Falls can cause injuries. They can happen to people of all ages. There are many things you can do to make your home safe and to help prevent falls. What can I do on the outside of my home? Regularly fix the edges of walkways and driveways and fix any cracks. Remove anything that might make you trip as you walk through a door, such as a raised step or threshold. Trim any bushes or trees on the path to your home. Use bright outdoor lighting. Clear any walking paths of anything that might make someone trip, such as rocks or tools. Regularly check to see if handrails are loose or broken. Make sure that both sides of any steps have handrails. Any raised decks and porches should have guardrails on the edges. Have any leaves, snow, or ice cleared regularly. Use sand or salt on walking paths during winter. Clean up any spills in your garage right away. This includes oil or grease spills. What can I do in the bathroom? Use night lights. Install grab bars by the toilet and in the tub and shower. Do not use towel  bars as grab bars. Use non-skid mats or decals in the tub or shower. If you need to sit down in the shower, use a plastic, non-slip stool. Keep the floor dry. Clean up any water that spills on the floor as soon as it happens. Remove soap buildup in the tub or shower regularly. Attach bath mats securely with double-sided non-slip rug tape. Do not have throw rugs and other things on the floor that can make you trip. What can I do in the bedroom? Use night lights. Make sure that you have a light by your bed that is easy to reach. Do not use any sheets or blankets that are too big for your bed. They should not hang down onto the floor. Have a firm chair that has side arms. You can use this for support while you get dressed. Do not have throw rugs and other things on the floor that can make you trip. What can I do in the kitchen? Clean up any spills right away. Avoid walking on wet floors. Keep items that you use a lot in easy-to-reach places. If you need to reach something above you, use a strong step stool that has a grab bar. Keep electrical cords out of the way. Do not use floor polish or wax that makes floors slippery. If you must use wax, use non-skid floor wax. Do not have throw rugs and other things on the floor that can make you trip. What can I do with my stairs? Do not leave any items on the  stairs. Make sure that there are handrails on both sides of the stairs and use them. Fix handrails that are broken or loose. Make sure that handrails are as long as the stairways. Check any carpeting to make sure that it is firmly attached to the stairs. Fix any carpet that is loose or worn. Avoid having throw rugs at the top or bottom of the stairs. If you do have throw rugs, attach them to the floor with carpet tape. Make sure that you have a light switch at the top of the stairs and the bottom of the stairs. If you do not have them, ask someone to add them for you. What else can I do to help  prevent falls? Wear shoes that: Do not have high heels. Have rubber bottoms. Are comfortable and fit you well. Are closed at the toe. Do not wear sandals. If you use a stepladder: Make sure that it is fully opened. Do not climb a closed stepladder. Make sure that both sides of the stepladder are locked into place. Ask someone to hold it for you, if possible. Clearly mark and make sure that you can see: Any grab bars or handrails. First and last steps. Where the edge of each step is. Use tools that help you move around (mobility aids) if they are needed. These include: Canes. Walkers. Scooters. Crutches. Turn on the lights when you go into a dark area. Replace any light bulbs as soon as they burn out. Set up your furniture so you have a clear path. Avoid moving your furniture around. If any of your floors are uneven, fix them. If there are any pets around you, be aware of where they are. Review your medicines with your doctor. Some medicines can make you feel dizzy. This can increase your chance of falling. Ask your doctor what other things that you can do to help prevent falls. This information is not intended to replace advice given to you by your health care provider. Make sure you discuss any questions you have with your health care provider. Document Released: 03/11/2009 Document Revised: 10/21/2015 Document Reviewed: 06/19/2014 Elsevier Interactive Patient Education  2017 [employer].

## 2021-03-24 NOTE — Progress Notes (Signed)
Subjective:   Vicki Martin is a 70 y.o. female who presents for Medicare Annual (Subsequent) preventive examination.  Review of Systems     Cardiac Risk Factors include: advanced age (>47men, >61 women);dyslipidemia;family history of premature cardiovascular disease;hypertension     Objective:    Today's Vitals   03/24/21 1504  BP: 118/60  Pulse: 77  Temp: (!) 97.5 F (36.4 C)  SpO2: 95%  Weight: 146 lb 3.2 oz (66.3 kg)  Height: 5\' 1"  (1.549 m)  PainSc: 0-No pain   Body mass index is 27.62 kg/m.  Advanced Directives 03/24/2021 09/12/2019 01/08/2018 01/01/2017 08/27/2015 08/08/2015  Does Patient Have a Medical Advance Directive? Yes No Yes Yes Yes No  Type of Advance Directive Living will;Healthcare Power of [place];Living will [place];Living will [place] -  Does patient want to make changes to medical advance directive? No - Patient declined - - - - -  Copy of [place] in Chart? No - copy requested - - No - copy requested - -  Would patient like information on creating a medical advance directive? - No - Patient declined - - - No - patient declined information    Current Medications (verified) Outpatient Encounter Medications as of 03/24/2021  Medication Sig   ARIPiprazole (ABILIFY) 5 MG tablet Take 1 tablet by mouth once daily   carvedilol (COREG) 12.5 MG tablet TAKE 1 TABLET BY MOUTH TWICE DAILY WITH A MEAL   clonazePAM (KLONOPIN) 1 MG tablet Take 1 tablet (1 mg total) by mouth 2 (two) times daily as needed. for anxiety   DULoxetine (CYMBALTA) 60 MG capsule Take 1 capsule by mouth once daily   gabapentin (NEURONTIN) 300 MG capsule Take 1 capsule by mouth at bedtime   potassium chloride SA (KLOR-CON) 20 MEQ tablet Take 1 tablet by mouth twice daily   atorvastatin (LIPITOR) 40 MG tablet Take 1 tablet (40 mg total) by mouth daily. (Patient not taking: Reported on 03/24/2021)   No  facility-administered encounter medications on file as of 03/24/2021.    Allergies (verified) Codeine and Penicillins   History: Past Medical History:  Diagnosis Date   Anxiety    Arthritis    Depression    Family history of pancreatic cancer    Family history of stomach cancer    GERD (gastroesophageal reflux disease)    Heart murmur    Hyperlipidemia    no medications   Hypertension    Migraines    Sleep apnea    pt declines CPAP   Past Surgical History:  Procedure Laterality Date   ABDOMINAL HYSTERECTOMY     reports complet but with ovaries retained   CHOLECYSTECTOMY     removed hip bone to replace vertebrae     Family History  Problem Relation Age of Onset   Rheum arthritis Mother    Stomach cancer Mother 75   Pancreatic cancer Mother 55   Stomach cancer Maternal Aunt    Pancreatic cancer Maternal Aunt    Heart disease Father    Hypertension Sister    Lung cancer Brother        deceased   Early death Brother    Liver cancer Brother    Hypertension Brother    COPD Brother    Cancer Maternal Uncle        NOS   Cancer Maternal Aunt        NOS   Lung cancer Other  Ovarian cysts Daughter    Diabetes Neg Hx    Alcohol abuse Neg Hx    Stroke Neg Hx    Colon cancer Neg Hx    Esophageal cancer Neg Hx    Rectal cancer Neg Hx    Social History   Socioeconomic History   Marital status: Widowed    Spouse name: Not on file   Number of children: 3   Years of education: Not on file   Highest education level: Not on file  Occupational History   Not on file  Tobacco Use   Smoking status: Never   Smokeless tobacco: Never  Vaping Use   Vaping Use: Never used  Substance and Sexual Activity   Alcohol use: No    Alcohol/week: 0.0 standard drinks   Drug use: No   Sexual activity: Not Currently  Other Topics Concern   Not on file  Social History Narrative   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, water quality      Home Situation: living with  husband - caregiver for him (lung ca) and brother      Spiritual Beliefs: Baptist      Lifestyle: no regular CV exercise; diet is ok      Social Determinants of Radio broadcast assistant Strain: Low Risk    Difficulty of Paying Living Expenses: Not hard at all  Food Insecurity: No Food Insecurity   Worried About Charity fundraiser in the Last Year: Never true   Arboriculturist in the Last Year: Never true  Transportation Needs: No Transportation Needs   Lack of Transportation (Medical): No   Lack of Transportation (Non-Medical): No  Physical Activity: Sufficiently Active   Days of Exercise per Week: 5 days   Minutes of Exercise per Session: 30 min  Stress: No Stress Concern Present   Feeling of Stress : Not at all  Social Connections: Moderately Integrated   Frequency of Communication with Friends and Family: More than three times a week   Frequency of Social Gatherings with Friends and Family: More than three times a week   Attends Religious Services: More than 4 times per year   Active Member of [employer] or Organizations: Yes   Attends Archivist Meetings: More than 4 times per year   Marital Status: Widowed    Tobacco Counseling Counseling given: Not Answered   Clinical Intake:  Pre-visit preparation completed: Yes  Pain : No/denies pain Pain Score: 0-No pain     BMI - recorded: 27.62 Nutritional Status: BMI 25 -29 Overweight Nutritional Risks: None Diabetes: No  How often do you need to have someone help you when you read instructions, pamphlets, or other written materials from your doctor or pharmacy?: 1 - Never What is the last grade level you completed in school?: High School Graduate  Diabetic? no  Interpreter Needed?: No  Information entered by :: Lisette Abu, LPN   Activities of Daily Living In your present state of health, do you have any difficulty performing the following activities: 03/24/2021 07/07/2020  Hearing? Y N  Vision? N  N  Difficulty concentrating or making decisions? N N  Walking or climbing stairs? N N  Dressing or bathing? N N  Doing errands, shopping? N N  Preparing Food and eating ? N -  Using the Toilet? N -  In the past six months, have you accidently leaked urine? N -  Do you have problems with loss of bowel control?  N -  Managing your Medications? N -  Managing your Finances? N -  Housekeeping or managing your Housekeeping? N -  Some recent data might be hidden    Patient Care Team: Janith Lima, MD as PCP - General (Internal Medicine) Nahser, Wonda Cheng, MD as PCP - Cardiology (Cardiology)  Indicate any recent Medical Services you may have received from other than [facility] providers in the past year (date may be approximate).     Assessment:   This is a routine wellness examination for Yetunde.  Hearing/Vision screen No results found.  Dietary issues and exercise activities discussed: Current Exercise Habits: Home exercise routine, Type of exercise: walking, Time (Minutes): 30, Frequency (Times/Week): 5, Weekly Exercise (Minutes/Week): 150, Intensity: Moderate, Exercise limited by: orthopedic condition(s);psychological condition(s)   Goals Addressed               This Visit's Progress     Patient Stated (pt-stated)        My goal is to get off some of these medications.      Depression Screen PHQ 2/9 Scores 03/24/2021 07/07/2020 07/15/2019 04/22/2018 01/08/2018 09/27/2017 01/01/2017  PHQ - 2 Score 0 0 5 1 1 5 2   PHQ- 9 Score - 0 13 6 6 19 8     Fall Risk Fall Risk  03/24/2021 07/15/2019 04/23/2019 01/08/2018 01/01/2017  Falls in the past year? 0 0 0 No No  Comment - - Emmi Telephone Survey: data to providers prior to load - -  Number falls in past yr: 0 0 - - -  Injury with Fall? 0 0 - - -  Risk for fall due to : No Fall Risks - - - -  Follow up Falls evaluation completed Falls evaluation completed - - -    FALL RISK PREVENTION PERTAINING TO THE HOME:  Any stairs in or around  the home? No  If so, are there any without handrails? No  Home free of loose throw rugs in walkways, pet beds, electrical cords, etc? Yes  Adequate lighting in your home to reduce risk of falls? Yes   ASSISTIVE DEVICES UTILIZED TO PREVENT FALLS:  Life alert? No  Use of a cane, walker or w/c? No  Grab bars in the bathroom? No  Shower chair or bench in shower? Yes  Elevated toilet seat or a handicapped toilet? No   TIMED UP AND GO:  Was the test performed? Yes .  Length of time to ambulate 10 feet: 8 sec.   Gait steady and fast without use of assistive device  Cognitive Function: Normal cognitive status assessed by direct observation by this Nurse Health Advisor. No abnormalities found.          Immunizations Immunization History  Administered Date(s) Administered   Fluad Quad(high Dose 65+) 05/14/2019, 04/06/2020   Influenza, High Dose Seasonal PF 03/09/2016, 04/22/2018   Influenza,inj,Quad PF,6+ Mos 08/04/2015   PFIZER(Purple Top)SARS-COV-2 Vaccination 09/02/2019, 09/23/2019   Pneumococcal Conjugate-13 12/11/2016   Pneumococcal Polysaccharide-23 01/08/2018   Td 07/27/2001   Tdap 08/04/2015   Zoster Recombinat (Shingrix) 01/24/2021    TDAP status: Up to date  Flu Vaccine status: Completed at today's visit  Pneumococcal vaccine status: Up to date  Covid-19 vaccine status: Completed vaccines  Qualifies for Shingles Vaccine? Yes   Zostavax completed No   Shingrix completed: no; need second dose  Screening Tests Health Maintenance  Topic Date Due   COVID-19 Vaccine (3 - Booster for Pfizer series) 11/18/2019   INFLUENZA VACCINE  12/27/2020  Zoster Vaccines- Shingrix (2 of 2) 03/21/2021   MAMMOGRAM  02/19/2023   TETANUS/TDAP  08/03/2025   COLONOSCOPY (Pts 45-88yrs Insurance coverage will need to be confirmed)  09/19/2025   Pneumonia Vaccine 27+ Years old  Completed   DEXA SCAN  Completed   Hepatitis C Screening  Completed   HPV VACCINES  Aged Out    Health  Maintenance  Health Maintenance Due  Topic Date Due   COVID-19 Vaccine (3 - Booster for Pfizer series) 11/18/2019   INFLUENZA VACCINE  12/27/2020   Zoster Vaccines- Shingrix (2 of 2) 03/21/2021    Colorectal cancer screening: Type of screening: Colonoscopy. Completed 09/20/2015. Repeat every 10 years  Mammogram status: Completed 02/18/2021. Repeat every year  Bone Density status: Completed 12/14/2016. Results reflect: Bone density results: NORMAL. Repeat every 5 years.  Lung Cancer Screening: (Low Dose CT Chest recommended if Age 72-80 years, 30 pack-year currently smoking OR have quit w/in 15years.) does not qualify.   Lung Cancer Screening Referral: no  Additional Screening:   Hepatitis C Screening: does qualify; Completed yes  Vision Screening: Recommended annual ophthalmology exams for early detection of glaucoma and other disorders of the eye. Is the patient up to date with their annual eye exam?  No  Who is the provider or what is the name of the office in which the patient attends annual eye exams? Information provided to contact [facility] If pt is not established with a provider, would they like to be referred to a provider to establish care? No .   Dental Screening: Recommended annual dental exams for proper oral hygiene  Community Resource Referral / Chronic Care Management: CRR required this visit?  No   CCM required this visit?  No      Plan:     I have personally reviewed and noted the following in the patient's chart:   Medical and social history Use of alcohol, tobacco or illicit drugs  Current medications and supplements including opioid prescriptions.  Functional ability and status Nutritional status Physical activity Advanced directives List of other physicians Hospitalizations, surgeries, and ER visits in previous 12 months Vitals Screenings to include cognitive, depression, and falls Referrals and appointments  In addition, I  have reviewed and discussed with patient certain preventive protocols, quality metrics, and best practice recommendations. A written personalized care plan for preventive services as well as general preventive health recommendations were provided to patient.     Sheral Flow, LPN   [id number]   Nurse Notes:  Hearing Screening - Comments:: Patient has noticed decreased hearing. No: hearing aids Vision Screening - Comments:: Patient wears corrective glasses/contacts.  Information provided to patient for eye exam to schedule with [facility].

## 2021-04-28 ENCOUNTER — Other Ambulatory Visit: Payer: Self-pay | Admitting: Internal Medicine

## 2021-04-28 DIAGNOSIS — F332 Major depressive disorder, recurrent severe without psychotic features: Secondary | ICD-10-CM

## 2021-05-29 ENCOUNTER — Other Ambulatory Visit: Payer: Self-pay | Admitting: Internal Medicine

## 2021-05-29 DIAGNOSIS — T502X5A Adverse effect of carbonic-anhydrase inhibitors, benzothiadiazides and other diuretics, initial encounter: Secondary | ICD-10-CM

## 2021-05-29 DIAGNOSIS — E876 Hypokalemia: Secondary | ICD-10-CM

## 2021-05-29 DIAGNOSIS — F332 Major depressive disorder, recurrent severe without psychotic features: Secondary | ICD-10-CM

## 2021-05-29 DIAGNOSIS — I10 Essential (primary) hypertension: Secondary | ICD-10-CM

## 2021-06-22 ENCOUNTER — Other Ambulatory Visit: Payer: Self-pay | Admitting: Internal Medicine

## 2021-06-22 DIAGNOSIS — F410 Panic disorder [episodic paroxysmal anxiety] without agoraphobia: Secondary | ICD-10-CM

## 2021-06-22 DIAGNOSIS — G2581 Restless legs syndrome: Secondary | ICD-10-CM

## 2021-06-24 ENCOUNTER — Other Ambulatory Visit: Payer: Self-pay | Admitting: Internal Medicine

## 2021-06-24 DIAGNOSIS — G2581 Restless legs syndrome: Secondary | ICD-10-CM

## 2021-06-27 ENCOUNTER — Other Ambulatory Visit: Payer: Self-pay | Admitting: Internal Medicine

## 2021-06-27 DIAGNOSIS — G2581 Restless legs syndrome: Secondary | ICD-10-CM

## 2021-08-05 ENCOUNTER — Other Ambulatory Visit: Payer: Self-pay | Admitting: Internal Medicine

## 2021-08-05 DIAGNOSIS — F332 Major depressive disorder, recurrent severe without psychotic features: Secondary | ICD-10-CM

## 2021-08-09 ENCOUNTER — Other Ambulatory Visit: Payer: Self-pay

## 2021-08-09 ENCOUNTER — Encounter: Payer: Self-pay | Admitting: Internal Medicine

## 2021-08-09 ENCOUNTER — Ambulatory Visit (INDEPENDENT_AMBULATORY_CARE_PROVIDER_SITE_OTHER): Payer: Medicare HMO | Admitting: Internal Medicine

## 2021-08-09 VITALS — BP 142/78 | HR 59 | Temp 98.3°F | Resp 16 | Ht 61.0 in | Wt 146.0 lb

## 2021-08-09 DIAGNOSIS — Z0001 Encounter for general adult medical examination with abnormal findings: Secondary | ICD-10-CM | POA: Diagnosis not present

## 2021-08-09 DIAGNOSIS — R0609 Other forms of dyspnea: Secondary | ICD-10-CM

## 2021-08-09 DIAGNOSIS — I10 Essential (primary) hypertension: Secondary | ICD-10-CM | POA: Diagnosis not present

## 2021-08-09 DIAGNOSIS — K2100 Gastro-esophageal reflux disease with esophagitis, without bleeding: Secondary | ICD-10-CM

## 2021-08-09 DIAGNOSIS — R002 Palpitations: Secondary | ICD-10-CM

## 2021-08-09 DIAGNOSIS — R7303 Prediabetes: Secondary | ICD-10-CM | POA: Diagnosis not present

## 2021-08-09 DIAGNOSIS — R9431 Abnormal electrocardiogram [ECG] [EKG]: Secondary | ICD-10-CM | POA: Diagnosis not present

## 2021-08-09 DIAGNOSIS — E785 Hyperlipidemia, unspecified: Secondary | ICD-10-CM

## 2021-08-09 DIAGNOSIS — F332 Major depressive disorder, recurrent severe without psychotic features: Secondary | ICD-10-CM | POA: Diagnosis not present

## 2021-08-09 DIAGNOSIS — F410 Panic disorder [episodic paroxysmal anxiety] without agoraphobia: Secondary | ICD-10-CM

## 2021-08-09 LAB — CBC WITH DIFFERENTIAL/PLATELET
Basophils Absolute: 0.1 10*3/uL (ref 0.0–0.1)
Basophils Relative: 2.1 % (ref 0.0–3.0)
Eosinophils Absolute: 0.3 10*3/uL (ref 0.0–0.7)
Eosinophils Relative: 4.2 % (ref 0.0–5.0)
HCT: 41 % (ref 36.0–46.0)
Hemoglobin: 13.7 g/dL (ref 12.0–15.0)
Lymphocytes Relative: 27.9 % (ref 12.0–46.0)
Lymphs Abs: 1.8 10*3/uL (ref 0.7–4.0)
MCHC: 33.4 g/dL (ref 30.0–36.0)
MCV: 90.8 fl (ref 78.0–100.0)
Monocytes Absolute: 0.7 10*3/uL (ref 0.1–1.0)
Monocytes Relative: 10.2 % (ref 3.0–12.0)
Neutro Abs: 3.6 10*3/uL (ref 1.4–7.7)
Neutrophils Relative %: 55.6 % (ref 43.0–77.0)
Platelets: 260 10*3/uL (ref 150.0–400.0)
RBC: 4.52 Mil/uL (ref 3.87–5.11)
RDW: 13.3 % (ref 11.5–15.5)
WBC: 6.4 10*3/uL (ref 4.0–10.5)

## 2021-08-09 LAB — BASIC METABOLIC PANEL
BUN: 12 mg/dL (ref 6–23)
CO2: 29 mEq/L (ref 19–32)
Calcium: 9.2 mg/dL (ref 8.4–10.5)
Chloride: 103 mEq/L (ref 96–112)
Creatinine, Ser: 0.89 mg/dL (ref 0.40–1.20)
GFR: 65.43 mL/min (ref >60.00)
Glucose, Bld: 80 mg/dL (ref 70–99)
Potassium: 4 mEq/L (ref 3.5–5.1)
Sodium: 138 mEq/L (ref 135–145)

## 2021-08-09 LAB — LIPID PANEL
Cholesterol: 169 mg/dL (ref 0–200)
HDL: 54.9 mg/dL (ref >39.00)
LDL Cholesterol: 79 mg/dL (ref 0–99)
NonHDL: 113.67
Total CHOL/HDL Ratio: 3
Triglycerides: 172 mg/dL — ABNORMAL HIGH (ref 0.0–149.0)
VLDL: 34.4 mg/dL (ref 0.0–40.0)

## 2021-08-09 LAB — HEPATIC FUNCTION PANEL
ALT: 24 U/L (ref 0–35)
AST: 25 U/L (ref 0–37)
Albumin: 4.2 g/dL (ref 3.5–5.2)
Alkaline Phosphatase: 99 U/L (ref 39–117)
Bilirubin, Direct: 0.1 mg/dL (ref 0.0–0.3)
Total Bilirubin: 0.5 mg/dL (ref 0.2–1.2)
Total Protein: 6.4 g/dL (ref 6.0–8.3)

## 2021-08-09 LAB — HEMOGLOBIN A1C: Hgb A1c MFr Bld: 6 % (ref 4.6–6.5)

## 2021-08-09 LAB — TSH: TSH: 2.38 u[IU]/mL (ref 0.35–5.50)

## 2021-08-09 NOTE — Patient Instructions (Signed)

## 2021-08-09 NOTE — Progress Notes (Signed)
? ?Subjective:  ?Patient ID: Vicki Martin, female    DOB: 10/16/1950  Age: 71 y.o. MRN: [id number] ? ?CC: Annual Exam and Hypertension ? ?This visit occurred during the SARS-CoV-2 public health emergency.  Safety protocols were in place, including screening questions prior to the visit, additional usage of staff PPE, and extensive cleaning of exam room while observing appropriate contact time as indicated for disinfecting solutions.   ? ?HPI ?Vicki Martin presents for a CPX and f/up -  ? ?She complains of a 3 month hx of SOB, fatigue, excessive sleeping, and anxiety/panic. She has also had "heart fluttering" for 3 weeks.  ? ?Outpatient Medications Prior to Visit  ?Medication Sig Dispense Refill  ? atorvastatin (LIPITOR) 40 MG tablet Take 1 tablet (40 mg total) by mouth daily. 90 tablet 1  ? carvedilol (COREG) 12.5 MG tablet TAKE 1 TABLET BY MOUTH TWICE DAILY WITH A MEAL 180 tablet 0  ? DULoxetine (CYMBALTA) 60 MG capsule Take 1 capsule by mouth once daily 90 capsule 0  ? gabapentin (NEURONTIN) 300 MG capsule Take 1 capsule by mouth at bedtime 90 capsule 0  ? potassium chloride SA (KLOR-CON M) 20 MEQ tablet Take 1 tablet by mouth twice daily 180 tablet 0  ? ARIPiprazole (ABILIFY) 5 MG tablet Take 1 tablet by mouth once daily 90 tablet 0  ? clonazePAM (KLONOPIN) 1 MG tablet Take 1 tablet (1 mg total) by mouth 2 (two) times daily as needed. for anxiety 60 tablet 3  ? ?No facility-administered medications prior to visit.  ? ? ?ROS ?Review of Systems  ?Constitutional:  Positive for fatigue. Negative for appetite change, chills, diaphoresis and unexpected weight change.  ?HENT: Negative.    ?Eyes: Negative.   ?Respiratory:  Positive for shortness of breath. Negative for cough, chest tightness and wheezing.   ?Cardiovascular:  Positive for palpitations (intermittent "fluttering" for 3 weeks). Negative for chest pain and leg swelling.  ?Gastrointestinal:  Negative for abdominal pain, constipation, diarrhea, nausea  and vomiting.  ?Endocrine: Negative.   ?Genitourinary: Negative.  Negative for difficulty urinating.  ?Musculoskeletal: Negative.  Negative for myalgias.  ?Skin: Negative.   ?Neurological: Negative.  Negative for dizziness, weakness and light-headedness.  ?Hematological:  Negative for adenopathy. Does not bruise/bleed easily.  ?Psychiatric/Behavioral:  Positive for decreased concentration, dysphoric mood and sleep disturbance. Negative for behavioral problems, confusion, self-injury and suicidal ideas. The patient is nervous/anxious. The patient is not hyperactive.   ? ?Objective:  ?BP (!) 142/78 (BP Location: Right Arm, Patient Position: Sitting, Cuff Size: Large)   Pulse (!) 59   Temp 98.3 ?F (36.8 ?C) (Oral)   Resp 16   Ht '5\' 1"'$  (1.549 m)   Wt 146 lb (66.2 kg)   SpO2 96%   BMI 27.59 kg/m?  ? ?BP Readings from Last 3 Encounters:  ?08/09/21 (!) 142/78  ?03/24/21 118/60  ?07/07/20 122/78  ? ? ?Wt Readings from Last 3 Encounters:  ?08/09/21 146 lb (66.2 kg)  ?03/24/21 146 lb 3.2 oz (66.3 kg)  ?07/07/20 143 lb 12.8 oz (65.2 kg)  ? ? ?Physical Exam ?Vitals reviewed.  ?Constitutional:   ?   Appearance: Normal appearance.  ?HENT:  ?   Nose: Nose normal.  ?   Mouth/Throat:  ?   Mouth: Mucous membranes are moist.  ?Eyes:  ?   General: No scleral icterus. ?   Conjunctiva/sclera: Conjunctivae normal.  ?Cardiovascular:  ?   Rate and Rhythm: Regular rhythm. Bradycardia present.  ?   Heart sounds: Normal  heart sounds, S1 normal and S2 normal. No murmur heard. ?  No friction rub. No gallop.  ?   Comments: EKG- ?SB, 56 bpm ?LAD, LBBB - unchanged ??Q wave in II - new ?Voltage loss in V5V6 - new ?Pulmonary:  ?   Effort: Pulmonary effort is normal.  ?   Breath sounds: No stridor. No wheezing, rhonchi or rales.  ?Abdominal:  ?   Palpations: There is no mass.  ?   Tenderness: There is no abdominal tenderness. There is no guarding or rebound.  ?   Hernia: No hernia is present.  ?Musculoskeletal:     ?   General: No swelling.  ?    Cervical back: Neck supple.  ?   Right lower leg: No edema.  ?   Left lower leg: No edema.  ?Lymphadenopathy:  ?   Cervical: No cervical adenopathy.  ?Skin: ?   General: Skin is warm and dry.  ?   Findings: No rash.  ?Neurological:  ?   General: No focal deficit present.  ?   Mental Status: She is alert and oriented to person, place, and time. Mental status is at baseline.  ?Psychiatric:     ?   Mood and Affect: Mood normal.     ?   Behavior: Behavior normal.  ? ? ?Lab Results  ?Component Value Date  ? WBC 6.4 08/09/2021  ? HGB 13.7 08/09/2021  ? HCT 41.0 08/09/2021  ? PLT 260.0 08/09/2021  ? GLUCOSE 80 08/09/2021  ? CHOL 169 08/09/2021  ? TRIG 172.0 (H) 08/09/2021  ? HDL 54.90 08/09/2021  ? LDLDIRECT 40.0 10/24/2018  ? [place] 79 08/09/2021  ? ALT 24 08/09/2021  ? AST 25 08/09/2021  ? NA 138 08/09/2021  ? K 4.0 08/09/2021  ? CL 103 08/09/2021  ? CREATININE 0.89 08/09/2021  ? BUN 12 08/09/2021  ? CO2 29 08/09/2021  ? TSH 2.38 08/09/2021  ? HGBA1C 6.0 08/09/2021  ? ? ?CT Soft Tissue Neck W Contrast ? ?Result Date: 06/11/2020 ?CLINICAL DATA:  Right neck swelling. EXAM: CT NECK WITH CONTRAST TECHNIQUE: Multidetector CT imaging of the neck was performed using the standard protocol following the bolus administration of intravenous contrast. CONTRAST:  35m OMNIPAQUE IOHEXOL 300 MG/ML  SOLN COMPARISON:  None. FINDINGS: Pharynx and larynx: There is fullness of the right submandibular space with a low-attenuation collection at the level of the angle of the mandible (5:38), likely a subperiosteal abscess. The source is likely the carious most posterior remaining right mandibular molar. Salivary glands: Right submandibular gland is enlarged. No sialolithiasis. Parotid and sublingual glands are normal. Thyroid: Normal Lymph nodes: Multiple right level 1B and level 2A lymph nodes which are likely reactive. Vascular: Negative. Limited intracranial: Negative. Visualized orbits: Negative. Mastoids and visualized paranasal sinuses:  Clear. Skeleton: No acute or aggressive process. Upper chest: Negative. Other: Lower right facial soft tissue swelling with inflammatory induration and thickening of the platysma. IMPRESSION: 1. Subperiosteal abscess of the right retromolar trigone, likely arising from carious most posterior remaining right mandibular molar. 2. Reactive right level 1B and level 2A lymphadenopathy. Reactive swelling of the right submandibular gland. Electronically Signed   By: KUlyses JarredM.D.   On: 06/11/2020 22:28  ? ? ?Assessment & Plan:  ? ?Maedell was seen today for annual exam and hypertension. ? ?Diagnoses and all orders for this visit: ? ?Essential hypertension - Her BP is adequately well controlled. ?-     CBC with Differential/Platelet; Future ?-  Basic metabolic panel; Future ?-     Urinalysis, Routine w reflex microscopic; Future ?-     TSH; Future ?-     Hepatic function panel; Future ?-     EKG 12-Lead ?-     Hepatic function panel ?-     TSH ?-     Urinalysis, Routine w reflex microscopic ?-     Basic metabolic panel ?-     CBC with Differential/Platelet ? ?Gastroesophageal reflux disease with esophagitis without hemorrhage- Her sx's are well controlled. ?-     CBC with Differential/Platelet; Future ?-     Hepatic function panel; Future ?-     Hepatic function panel ?-     CBC with Differential/Platelet ? ?Hyperlipidemia LDL goal <130- LDL goal achieved. Doing well on the statin  ?-     Lipid panel; Future ?-     TSH; Future ?-     TSH ?-     Lipid panel ? ?Prediabetes- Her A1C is 6.0%. Medical therapy is not indicated. ?-     Basic metabolic panel; Future ?-     Hemoglobin A1c; Future ?-     Hemoglobin A1c ?-     Basic metabolic panel ? ?Intermittent palpitations- Will screen for dysrhythmia.  ?-     CARDIAC EVENT MONITOR; Future ? ?Abnormal electrocardiogram (ECG) (EKG) ?-     Cardiac Stress Test: Informed Consent Details: Physician/Practitioner Attestation; Transcribe to consent form and obtain patient  signature; Future ? ?Abnormal electrocardiogram ?-     MYOCARDIAL PERFUSION IMAGING; Future ?-     Cardiac Stress Test: Informed Consent Details: Physician/Practitioner Attestation; Transcribe to consent form and obt

## 2021-08-10 ENCOUNTER — Encounter: Payer: Self-pay | Admitting: Internal Medicine

## 2021-08-10 LAB — URINALYSIS, ROUTINE W REFLEX MICROSCOPIC
Bilirubin Urine: NEGATIVE
Hgb urine dipstick: NEGATIVE
Ketones, ur: NEGATIVE
Leukocytes,Ua: NEGATIVE
Nitrite: NEGATIVE
Specific Gravity, Urine: 1.015 (ref 1.000–1.030)
Total Protein, Urine: NEGATIVE
Urine Glucose: NEGATIVE
Urobilinogen, UA: 0.2 (ref 0.0–1.0)
WBC, UA: NONE SEEN (ref 0–?)
pH: 6 (ref 5.0–8.0)

## 2021-08-10 MED ORDER — CLONAZEPAM 1 MG PO TABS: 1.0000 mg | ORAL_TABLET | Freq: Two times a day (BID) | ORAL | 2 refills | Status: DC | PRN

## 2021-08-13 ENCOUNTER — Encounter: Payer: Self-pay | Admitting: Internal Medicine

## 2021-08-17 ENCOUNTER — Ambulatory Visit: Payer: Medicare HMO | Admitting: Internal Medicine

## 2021-08-18 ENCOUNTER — Ambulatory Visit (INDEPENDENT_AMBULATORY_CARE_PROVIDER_SITE_OTHER): Payer: Medicare HMO

## 2021-08-18 DIAGNOSIS — R002 Palpitations: Secondary | ICD-10-CM

## 2021-08-22 ENCOUNTER — Ambulatory Visit: Payer: Medicare HMO | Admitting: Internal Medicine

## 2021-09-01 ENCOUNTER — Other Ambulatory Visit: Payer: Self-pay | Admitting: Internal Medicine

## 2021-09-01 DIAGNOSIS — I10 Essential (primary) hypertension: Secondary | ICD-10-CM

## 2021-09-01 DIAGNOSIS — F332 Major depressive disorder, recurrent severe without psychotic features: Secondary | ICD-10-CM

## 2021-09-11 ENCOUNTER — Other Ambulatory Visit: Payer: Self-pay | Admitting: Internal Medicine

## 2021-09-11 DIAGNOSIS — E876 Hypokalemia: Secondary | ICD-10-CM

## 2021-09-11 DIAGNOSIS — T502X5A Adverse effect of carbonic-anhydrase inhibitors, benzothiadiazides and other diuretics, initial encounter: Secondary | ICD-10-CM

## 2021-09-11 DIAGNOSIS — I10 Essential (primary) hypertension: Secondary | ICD-10-CM

## 2021-10-18 DIAGNOSIS — L821 Other seborrheic keratosis: Secondary | ICD-10-CM | POA: Diagnosis not present

## 2021-10-18 DIAGNOSIS — L814 Other melanin hyperpigmentation: Secondary | ICD-10-CM | POA: Diagnosis not present

## 2021-10-18 DIAGNOSIS — L538 Other specified erythematous conditions: Secondary | ICD-10-CM | POA: Diagnosis not present

## 2021-10-18 DIAGNOSIS — L820 Inflamed seborrheic keratosis: Secondary | ICD-10-CM | POA: Diagnosis not present

## 2021-10-18 DIAGNOSIS — L298 Other pruritus: Secondary | ICD-10-CM | POA: Diagnosis not present

## 2021-12-07 ENCOUNTER — Other Ambulatory Visit: Payer: Self-pay | Admitting: Internal Medicine

## 2021-12-07 DIAGNOSIS — I10 Essential (primary) hypertension: Secondary | ICD-10-CM

## 2021-12-07 DIAGNOSIS — F332 Major depressive disorder, recurrent severe without psychotic features: Secondary | ICD-10-CM

## 2021-12-16 ENCOUNTER — Other Ambulatory Visit: Payer: Self-pay | Admitting: Internal Medicine

## 2021-12-16 DIAGNOSIS — I10 Essential (primary) hypertension: Secondary | ICD-10-CM

## 2021-12-16 DIAGNOSIS — E876 Hypokalemia: Secondary | ICD-10-CM

## 2022-01-19 DIAGNOSIS — L538 Other specified erythematous conditions: Secondary | ICD-10-CM | POA: Diagnosis not present

## 2022-01-19 DIAGNOSIS — L821 Other seborrheic keratosis: Secondary | ICD-10-CM | POA: Diagnosis not present

## 2022-01-19 DIAGNOSIS — D485 Neoplasm of uncertain behavior of skin: Secondary | ICD-10-CM | POA: Diagnosis not present

## 2022-03-15 ENCOUNTER — Other Ambulatory Visit: Payer: Self-pay | Admitting: Internal Medicine

## 2022-03-15 DIAGNOSIS — F332 Major depressive disorder, recurrent severe without psychotic features: Secondary | ICD-10-CM

## 2022-03-15 DIAGNOSIS — I10 Essential (primary) hypertension: Secondary | ICD-10-CM

## 2022-03-27 ENCOUNTER — Ambulatory Visit (INDEPENDENT_AMBULATORY_CARE_PROVIDER_SITE_OTHER): Payer: Medicare HMO

## 2022-03-27 VITALS — Ht 61.0 in

## 2022-03-27 DIAGNOSIS — Z0000 Encounter for general adult medical examination without abnormal findings: Secondary | ICD-10-CM

## 2022-03-27 DIAGNOSIS — Z1239 Encounter for other screening for malignant neoplasm of breast: Secondary | ICD-10-CM | POA: Diagnosis not present

## 2022-03-27 DIAGNOSIS — Z13820 Encounter for screening for osteoporosis: Secondary | ICD-10-CM

## 2022-03-27 NOTE — Patient Instructions (Signed)
Vicki Martin , Thank you for taking time to come for your Medicare Wellness Visit. I appreciate your ongoing commitment to your health goals. Please review the following plan we discussed and let me know if I can assist you in the future.   These are the goals we discussed:  Goals      Client understands the importance of follow-up with providers by attending scheduled visits     Get back on my anxiety/depression medications.        This is a list of the screening recommended for you and due dates:  Health Maintenance  Topic Date Due   COVID-19 Vaccine (3 - Pfizer series) 11/18/2019   Zoster (Shingles) Vaccine (2 of 2) 03/21/2021   Flu Shot  12/27/2021   Medicare Annual Wellness Visit  03/24/2022   Mammogram  02/19/2023   Tetanus Vaccine  08/03/2025   Colon Cancer Screening  09/19/2025   Pneumonia Vaccine  Completed   DEXA scan (bone density measurement)  Completed   Hepatitis C Screening: USPSTF Recommendation to screen - Ages 15-79 yo.  Completed   HPV Vaccine  Aged Out    Advanced directives: Yes; Please bring a copy of your health care power of attorney and living will to the office at your convenience.  Conditions/risks identified: Yes  Next appointment: Follow up in one year for your annual wellness visit.   Preventive Care 19 Years and Older, Female Preventive care refers to lifestyle choices and visits with your health care provider that can promote health and wellness. What does preventive care include? A yearly physical exam. This is also called an annual well check. Dental exams once or twice a year. Routine eye exams. Ask your health care provider how often you should have your eyes checked. Personal lifestyle choices, including: Daily care of your teeth and gums. Regular physical activity. Eating a healthy diet. Avoiding tobacco and drug use. Limiting alcohol use. Practicing safe sex. Taking low-dose aspirin every day. Taking vitamin and mineral  supplements as recommended by your health care provider. What happens during an annual well check? The services and screenings done by your health care provider during your annual well check will depend on your age, overall health, lifestyle risk factors, and family history of disease. Counseling  Your health care provider may ask you questions about your: Alcohol use. Tobacco use. Drug use. Emotional well-being. Home and relationship well-being. Sexual activity. Eating habits. History of falls. Memory and ability to understand (cognition). Work and work Statistician. Reproductive health. Screening  You may have the following tests or measurements: Height, weight, and BMI. Blood pressure. Lipid and cholesterol levels. These may be checked every 5 years, or more frequently if you are over 66 years old. Skin check. Lung cancer screening. You may have this screening every year starting at age 11 if you have a 30-pack-year history of smoking and currently smoke or have quit within the past 15 years. Fecal occult blood test (FOBT) of the stool. You may have this test every year starting at age 75. Flexible sigmoidoscopy or colonoscopy. You may have a sigmoidoscopy every 5 years or a colonoscopy every 10 years starting at age 20. Hepatitis C blood test. Hepatitis B blood test. Sexually transmitted disease (STD) testing. Diabetes screening. This is done by checking your blood sugar (glucose) after you have not eaten for a while (fasting). You may have this done every 1-3 years. Bone density scan. This is done to screen for osteoporosis. You may have this  done starting at age 31. Mammogram. This may be done every 1-2 years. Talk to your health care provider about how often you should have regular mammograms. Talk with your health care provider about your test results, treatment options, and if necessary, the need for more tests. Vaccines  Your health care provider may recommend certain  vaccines, such as: Influenza vaccine. This is recommended every year. Tetanus, diphtheria, and acellular pertussis (Tdap, Td) vaccine. You may need a Td booster every 10 years. Zoster vaccine. You may need this after age 49. Pneumococcal 13-valent conjugate (PCV13) vaccine. One dose is recommended after age 75. Pneumococcal polysaccharide (PPSV23) vaccine. One dose is recommended after age 57. Talk to your health care provider about which screenings and vaccines you need and how often you need them. This information is not intended to replace advice given to you by your health care provider. Make sure you discuss any questions you have with your health care provider. Document Released: 06/11/2015 Document Revised: 02/02/2016 Document Reviewed: 03/16/2015 Elsevier Interactive Patient Education  2017 [place] Prevention in the Home Falls can cause injuries. They can happen to people of all ages. There are many things you can do to make your home safe and to help prevent falls. What can I do on the outside of my home? Regularly fix the edges of walkways and driveways and fix any cracks. Remove anything that might make you trip as you walk through a door, such as a raised step or threshold. Trim any bushes or trees on the path to your home. Use bright outdoor lighting. Clear any walking paths of anything that might make someone trip, such as rocks or tools. Regularly check to see if handrails are loose or broken. Make sure that both sides of any steps have handrails. Any raised decks and porches should have guardrails on the edges. Have any leaves, snow, or ice cleared regularly. Use sand or salt on walking paths during winter. Clean up any spills in your garage right away. This includes oil or grease spills. What can I do in the bathroom? Use night lights. Install grab bars by the toilet and in the tub and shower. Do not use towel bars as grab bars. Use non-skid mats or decals in  the tub or shower. If you need to sit down in the shower, use a plastic, non-slip stool. Keep the floor dry. Clean up any water that spills on the floor as soon as it happens. Remove soap buildup in the tub or shower regularly. Attach bath mats securely with double-sided non-slip rug tape. Do not have throw rugs and other things on the floor that can make you trip. What can I do in the bedroom? Use night lights. Make sure that you have a light by your bed that is easy to reach. Do not use any sheets or blankets that are too big for your bed. They should not hang down onto the floor. Have a firm chair that has side arms. You can use this for support while you get dressed. Do not have throw rugs and other things on the floor that can make you trip. What can I do in the kitchen? Clean up any spills right away. Avoid walking on wet floors. Keep items that you use a lot in easy-to-reach places. If you need to reach something above you, use a strong step stool that has a grab bar. Keep electrical cords out of the way. Do not use floor polish or wax  that makes floors slippery. If you must use wax, use non-skid floor wax. Do not have throw rugs and other things on the floor that can make you trip. What can I do with my stairs? Do not leave any items on the stairs. Make sure that there are handrails on both sides of the stairs and use them. Fix handrails that are broken or loose. Make sure that handrails are as long as the stairways. Check any carpeting to make sure that it is firmly attached to the stairs. Fix any carpet that is loose or worn. Avoid having throw rugs at the top or bottom of the stairs. If you do have throw rugs, attach them to the floor with carpet tape. Make sure that you have a light switch at the top of the stairs and the bottom of the stairs. If you do not have them, ask someone to add them for you. What else can I do to help prevent falls? Wear shoes that: Do not have high  heels. Have rubber bottoms. Are comfortable and fit you well. Are closed at the toe. Do not wear sandals. If you use a stepladder: Make sure that it is fully opened. Do not climb a closed stepladder. Make sure that both sides of the stepladder are locked into place. Ask someone to hold it for you, if possible. Clearly mark and make sure that you can see: Any grab bars or handrails. First and last steps. Where the edge of each step is. Use tools that help you move around (mobility aids) if they are needed. These include: Canes. Walkers. Scooters. Crutches. Turn on the lights when you go into a dark area. Replace any light bulbs as soon as they burn out. Set up your furniture so you have a clear path. Avoid moving your furniture around. If any of your floors are uneven, fix them. If there are any pets around you, be aware of where they are. Review your medicines with your doctor. Some medicines can make you feel dizzy. This can increase your chance of falling. Ask your doctor what other things that you can do to help prevent falls. This information is not intended to replace advice given to you by your health care provider. Make sure you discuss any questions you have with your health care provider. Document Released: 03/11/2009 Document Revised: 10/21/2015 Document Reviewed: 06/19/2014 Elsevier Interactive Patient Education  2017 [employer].

## 2022-03-27 NOTE — Progress Notes (Signed)
Subjective:   Vicki Martin is a 71 y.o. female who presents for Medicare Annual (Subsequent) preventive examination.  Review of Systems     Cardiac Risk Factors include: advanced age (>82mn, >>59women);hypertension     Objective:    Today's Vitals   03/27/22 1558  Height: '5\' 1"'$  (1.549 m)  PainSc: 0-No pain   Body mass index is 27.59 kg/m.     03/27/2022    3:41 PM 03/24/2021    4:23 PM 09/12/2019    7:13 PM 01/08/2018    3:35 PM 01/01/2017    4:19 PM 08/27/2015    4:10 PM 08/08/2015    6:42 PM  Advanced Directives  Does Patient Have a Medical Advance Directive? Yes Yes No Yes Yes Yes No  Type of [place]aramed[place]of [place] will Living will;Healthcare Power of ALake Meredith EstatesLiving will [place]  Does patient want to make changes to medical advance directive?  No - Patient declined       Copy of HOrosiin Chart? No - copy requested No - copy requested   No - copy requested    Would patient like information on creating a medical advance directive?   No - Patient declined    No - patient declined information    Current Medications (verified) Outpatient Encounter Medications as of 03/27/2022  Medication Sig   carvedilol (COREG) 12.5 MG tablet TAKE 1 TABLET BY MOUTH TWICE DAILY WITH A MEAL   DULoxetine (CYMBALTA) 60 MG capsule Take 1 capsule by mouth once daily   potassium chloride SA (KLOR-CON M) 20 MEQ tablet Take 1 tablet by mouth twice daily   atorvastatin (LIPITOR) 40 MG tablet Take 1 tablet (40 mg total) by mouth daily.   clonazePAM (KLONOPIN) 1 MG tablet Take 1 tablet (1 mg total) by mouth 2 (two) times daily as needed.   gabapentin (NEURONTIN) 300 MG capsule Take 1 capsule by mouth at bedtime   No facility-administered encounter medications on file as of 03/27/2022.    Allergies (verified) Codeine and Penicillins    History: Past Medical History:  Diagnosis Date   Anxiety    Arthritis    Depression    Family history of pancreatic cancer    Family history of stomach cancer    GERD (gastroesophageal reflux disease)    Heart murmur    Hyperlipidemia    no medications   Hypertension    Migraines    Sleep apnea    pt declines CPAP   Past Surgical History:  Procedure Laterality Date   ABDOMINAL HYSTERECTOMY     reports complet but with ovaries retained   CHOLECYSTECTOMY     removed hip bone to replace vertebrae     Family History  Problem Relation Age of Onset   Rheum arthritis Mother    Stomach cancer Mother 775  Pancreatic cancer Mother 771  Heart disease Father    Hypertension Sister    Cancer Sister        lung   Lung cancer Brother        deceased   Early death Brother    Liver cancer Brother    Hypertension Brother    COPD Brother    Ovarian cysts Daughter    Stomach cancer Maternal Aunt    Pancreatic cancer Maternal Aunt    Cancer Maternal Aunt  NOS   Cancer Maternal Uncle        NOS   Lung cancer Other    Diabetes Neg Hx    Alcohol abuse Neg Hx    Stroke Neg Hx    Colon cancer Neg Hx    Esophageal cancer Neg Hx    Rectal cancer Neg Hx    Social History   Socioeconomic History   Marital status: Widowed    Spouse name: Not on file   Number of children: 3   Years of education: Not on file   Highest education level: Not on file  Occupational History   Not on file  Tobacco Use   Smoking status: Never   Smokeless tobacco: Never  Vaping Use   Vaping Use: Never used  Substance and Sexual Activity   Alcohol use: No    Alcohol/week: 0.0 standard drinks of alcohol   Drug use: No   Sexual activity: Not Currently    Partners: Male  Other Topics Concern   Not on file  Social History Narrative   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, water quality      Home Situation: living with husband - caregiver for him (lung ca) and brother      Spiritual  Beliefs: Baptist      Lifestyle: no regular CV exercise; diet is ok      Social Determinants of Radio broadcast assistant Strain: Low Risk  (03/27/2022)   Overall Financial Resource Strain (CARDIA)    Difficulty of Paying Living Expenses: Not hard at all  Food Insecurity: No Food Insecurity (03/27/2022)   Hunger Vital Sign    Worried About Running Out of Food in the Last Year: Never true    [place] in the Last Year: Never true  Transportation Needs: No Transportation Needs (03/27/2022)   PRAPARE - Hydrologist (Medical): No    Lack of Transportation (Non-Medical): No  Physical Activity: Inactive (03/27/2022)   Exercise Vital Sign    Days of Exercise per Week: 0 days    Minutes of Exercise per Session: 0 min  Stress: No Stress Concern Present (03/27/2022)   [place]    Feeling of Stress : Not at all  Social Connections: Moderately Integrated (03/27/2022)   Social Connection and Isolation Panel [NHANES]    Frequency of Communication with Friends and Family: More than three times a week    Frequency of Social Gatherings with Friends and Family: More than three times a week    Attends Religious Services: More than 4 times per year    Active Member of [employer] or Organizations: Yes    Attends Archivist Meetings: More than 4 times per year    Marital Status: Widowed    Tobacco Counseling Counseling given: Not Answered   Clinical Intake:  Pre-visit preparation completed: Yes  Pain : No/denies pain Pain Score: 0-No pain     Nutritional Risks: None Diabetes: No  How often do you need to have someone help you when you read instructions, pamphlets, or other written materials from your doctor or pharmacy?: 1 - Never What is the last grade level you completed in school?: HSG  Diabetic? no  Interpreter Needed?: No  Information entered by :: Lisette Abu,  LPN.   Activities of Daily Living    03/27/2022    3:46 PM  In your present state of health, do you  have any difficulty performing the following activities:  Hearing? 1  Vision? 0  Difficulty concentrating or making decisions? 0  Walking or climbing stairs? 0  Dressing or bathing? 0  Doing errands, shopping? 0  Preparing Food and eating ? N  Using the Toilet? N  In the past six months, have you accidently leaked urine? N  Do you have problems with loss of bowel control? N  Managing your Medications? N  Managing your Finances? N  Housekeeping or managing your Housekeeping? N    Patient Care Team: Janith Lima, MD as PCP - General (Internal Medicine) Nahser, Wonda Cheng, MD as PCP - Cardiology (Cardiology)  Indicate any recent Medical Services you may have received from other than [facility] providers in the past year (date may be approximate).     Assessment:   This is a routine wellness examination for Vicki Martin.  Hearing/Vision screen Hearing Screening - Comments:: Patient has issues with hearing.  No hearing aids. Vision Screening - Comments:: Patient wears readers - not up to date with routine eye exams.   Dietary issues and exercise activities discussed: Current Exercise Habits: The patient does not participate in regular exercise at present, Exercise limited by: psychological condition(s)   Goals Addressed             This Visit's Progress    Client understands the importance of follow-up with providers by attending scheduled visits       Get back on my anxiety/depression medications.      Depression Screen    03/27/2022    3:44 PM 08/09/2021    3:44 PM 03/24/2021    4:22 PM 07/07/2020    3:12 PM 07/15/2019    3:20 PM 04/22/2018    4:26 PM 01/08/2018    3:36 PM  PHQ 2/9 Scores  PHQ - 2 Score 6 5 0 0 '5 1 1  '$ PHQ- 9 Score 18 17  0 '13 6 6    '$ Fall Risk    03/27/2022    3:42 PM 03/24/2021    4:25 PM 07/15/2019    3:20 PM 04/23/2019    9:28 AM 01/08/2018     3:36 PM  Fall Risk   Falls in the past year? 0 0 0 0 No  Comment    Emmi Telephone Survey: data to providers prior to load   Number falls in past yr: 0 0 0    Injury with Fall? 0 0 0    Risk for fall due to : No Fall Risks No Fall Risks     Follow up Falls prevention discussed Falls evaluation completed Falls evaluation completed      [place]:  Any stairs in or around the home? No  If so, are there any without handrails? No  Home free of loose throw rugs in walkways, pet beds, electrical cords, etc? Yes  Adequate lighting in your home to reduce risk of falls? Yes   ASSISTIVE DEVICES UTILIZED TO PREVENT FALLS:  Life alert? No  Use of a cane, walker or w/c? No  Grab bars in the bathroom? No  Shower chair or bench in shower? No  Elevated toilet seat or a handicapped toilet? No   TIMED UP AND GO:  Was the test performed? No . Phone Visit  Cognitive Function:        03/27/2022    3:47 PM  6CIT Screen  What Year? 0 points  What month? 0 points  What time?  0 points  Count back from 20 0 points  Months in reverse 0 points  Repeat phrase 0 points  Total Score 0 points    Immunizations Immunization History  Administered Date(s) Administered   Fluad Quad(high Dose 65+) 05/14/2019, 04/06/2020, 03/24/2021   Influenza, High Dose Seasonal PF 03/09/2016, 04/22/2018   Influenza,inj,Quad PF,6+ Mos 08/04/2015   PFIZER(Purple Top)SARS-COV-2 Vaccination 09/02/2019, 09/23/2019   Pneumococcal Conjugate-13 12/11/2016   Pneumococcal Polysaccharide-23 01/08/2018   Td 07/27/2001   Tdap 08/04/2015   Zoster Recombinat (Shingrix) 01/24/2021    TDAP status: Up to date  Flu Vaccine status: Due, Education has been provided regarding the importance of this vaccine. Advised may receive this vaccine at local pharmacy or Health Dept. Aware to provide a copy of the vaccination record if obtained from local pharmacy or Health Dept. Verbalized acceptance and  understanding.  Pneumococcal vaccine status: Up to date  Covid-19 vaccine status: Completed vaccines  Qualifies for Shingles Vaccine? Yes   Zostavax completed No   Shingrix Completed?: Yes  Screening Tests Health Maintenance  Topic Date Due   COVID-19 Vaccine (3 - Pfizer series) 11/18/2019   Zoster Vaccines- Shingrix (2 of 2) 03/21/2021   INFLUENZA VACCINE  12/27/2021   MAMMOGRAM  02/19/2023   Medicare Annual Wellness (AWV)  03/28/2023   TETANUS/TDAP  08/03/2025   COLONOSCOPY (Pts 45-41yr Insurance coverage will need to be confirmed)  09/19/2025   Pneumonia Vaccine 71 Years old  Completed   DEXA SCAN  Completed   Hepatitis C Screening  Completed   HPV VACCINES  Aged Out    Health Maintenance  Health Maintenance Due  Topic Date Due   COVID-19 Vaccine (3 - Pfizer series) 11/18/2019   Zoster Vaccines- Shingrix (2 of 2) 03/21/2021   INFLUENZA VACCINE  12/27/2021    Colorectal cancer screening: Type of screening: Colonoscopy. Completed 09/20/2015. Repeat every 10 years  Mammogram status: Ordered 03/27/2022. Pt provided with contact info and advised to call to schedule appt.   Bone Density status: Ordered 03/27/2022. Pt provided with contact info and advised to call to schedule appt.  Lung Cancer Screening: (Low Dose CT Chest recommended if Age 71-80years, 30 pack-year currently smoking OR have quit w/in 15years.) does not qualify.   Lung Cancer Screening Referral: no  Additional Screening:  Hepatitis C Screening: does qualify; Completed 10/24/2018  Vision Screening: Recommended annual ophthalmology exams for early detection of glaucoma and other disorders of the eye. Is the patient up to date with their annual eye exam?  No  Who is the provider or what is the name of the office in which the patient attends annual eye exams? Declined If pt is not established with a provider, would they like to be referred to a provider to establish care? No .   Dental Screening:  Recommended annual dental exams for proper oral hygiene  Community Resource Referral / Chronic Care Management: CRR required this visit?  No   CCM required this visit?  No      Plan:     I have personally reviewed and noted the following in the patient's chart:   Medical and social history Use of alcohol, tobacco or illicit drugs  Current medications and supplements including opioid prescriptions. Patient is not currently taking opioid prescriptions. Functional ability and status Nutritional status Physical activity Advanced directives List of other physicians Hospitalizations, surgeries, and ER visits in previous 12 months Vitals Screenings to include cognitive, depression, and falls Referrals and appointments  In addition, I have  reviewed and discussed with patient certain preventive protocols, quality metrics, and best practice recommendations. A written personalized care plan for preventive services as well as general preventive health recommendations were provided to patient.     Sheral Flow, LPN   [id number]   Nurse Notes: N/A

## 2022-03-30 ENCOUNTER — Ambulatory Visit (INDEPENDENT_AMBULATORY_CARE_PROVIDER_SITE_OTHER): Payer: Medicare HMO | Admitting: Internal Medicine

## 2022-03-30 ENCOUNTER — Encounter: Payer: Self-pay | Admitting: Internal Medicine

## 2022-03-30 VITALS — BP 144/94 | HR 100 | Temp 98.2°F | Resp 16 | Ht 61.0 in | Wt 146.0 lb

## 2022-03-30 DIAGNOSIS — I10 Essential (primary) hypertension: Secondary | ICD-10-CM

## 2022-03-30 DIAGNOSIS — F410 Panic disorder [episodic paroxysmal anxiety] without agoraphobia: Secondary | ICD-10-CM

## 2022-03-30 DIAGNOSIS — R072 Precordial pain: Secondary | ICD-10-CM

## 2022-03-30 DIAGNOSIS — R000 Tachycardia, unspecified: Secondary | ICD-10-CM | POA: Diagnosis not present

## 2022-03-30 DIAGNOSIS — F332 Major depressive disorder, recurrent severe without psychotic features: Secondary | ICD-10-CM

## 2022-03-30 DIAGNOSIS — R7989 Other specified abnormal findings of blood chemistry: Secondary | ICD-10-CM

## 2022-03-30 DIAGNOSIS — Z23 Encounter for immunization: Secondary | ICD-10-CM

## 2022-03-30 DIAGNOSIS — R7303 Prediabetes: Secondary | ICD-10-CM | POA: Diagnosis not present

## 2022-03-30 DIAGNOSIS — E785 Hyperlipidemia, unspecified: Secondary | ICD-10-CM | POA: Diagnosis not present

## 2022-03-30 LAB — CBC WITH DIFFERENTIAL/PLATELET
Basophils Absolute: 0.1 10*3/uL (ref 0.0–0.1)
Basophils Relative: 1.9 % (ref 0.0–3.0)
Eosinophils Absolute: 0.3 10*3/uL (ref 0.0–0.7)
Eosinophils Relative: 4 % (ref 0.0–5.0)
HCT: 44.9 % (ref 36.0–46.0)
Hemoglobin: 14.6 g/dL (ref 12.0–15.0)
Lymphocytes Relative: 21.6 % (ref 12.0–46.0)
Lymphs Abs: 1.4 10*3/uL (ref 0.7–4.0)
MCHC: 32.5 g/dL (ref 30.0–36.0)
MCV: 91.9 fl (ref 78.0–100.0)
Monocytes Absolute: 0.6 10*3/uL (ref 0.1–1.0)
Monocytes Relative: 8.9 % (ref 3.0–12.0)
Neutro Abs: 4.2 10*3/uL (ref 1.4–7.7)
Neutrophils Relative %: 63.6 % (ref 43.0–77.0)
Platelets: 308 10*3/uL (ref 150.0–400.0)
RBC: 4.89 Mil/uL (ref 3.87–5.11)
RDW: 13.6 % (ref 11.5–15.5)
WBC: 6.6 10*3/uL (ref 4.0–10.5)

## 2022-03-30 LAB — BASIC METABOLIC PANEL
BUN: 10 mg/dL (ref 6–23)
CO2: 25 mEq/L (ref 19–32)
Calcium: 9.4 mg/dL (ref 8.4–10.5)
Chloride: 102 mEq/L (ref 96–112)
Creatinine, Ser: 0.91 mg/dL (ref 0.40–1.20)
GFR: 63.42 mL/min (ref >60.00)
Glucose, Bld: 133 mg/dL — ABNORMAL HIGH (ref 70–99)
Potassium: 3.8 mEq/L (ref 3.5–5.1)
Sodium: 139 mEq/L (ref 135–145)

## 2022-03-30 LAB — TROPONIN I (HIGH SENSITIVITY): High Sens Troponin I: 17 ng/L (ref 2–17)

## 2022-03-30 LAB — HEPATIC FUNCTION PANEL
ALT: 22 U/L (ref 0–35)
AST: 25 U/L (ref 0–37)
Albumin: 4.4 g/dL (ref 3.5–5.2)
Alkaline Phosphatase: 99 U/L (ref 39–117)
Bilirubin, Direct: 0.1 mg/dL (ref 0.0–0.3)
Total Bilirubin: 0.5 mg/dL (ref 0.2–1.2)
Total Protein: 7.2 g/dL (ref 6.0–8.3)

## 2022-03-30 LAB — BRAIN NATRIURETIC PEPTIDE: Pro B Natriuretic peptide (BNP): 68 pg/mL (ref 0.0–100.0)

## 2022-03-30 LAB — HEMOGLOBIN A1C: Hgb A1c MFr Bld: 6 % (ref 4.6–6.5)

## 2022-03-30 MED ORDER — DULOXETINE HCL 60 MG PO CPEP: 60.0000 mg | ORAL_CAPSULE | Freq: Every day | ORAL | 0 refills | Status: DC

## 2022-03-30 MED ORDER — CLONAZEPAM 1 MG PO TABS: 1.0000 mg | ORAL_TABLET | Freq: Two times a day (BID) | ORAL | 2 refills | Status: DC | PRN

## 2022-03-30 MED ORDER — ATORVASTATIN CALCIUM 40 MG PO TABS: 40.0000 mg | ORAL_TABLET | Freq: Every day | ORAL | 1 refills | Status: DC

## 2022-03-30 MED ORDER — CARVEDILOL 12.5 MG PO TABS: 12.5000 mg | ORAL_TABLET | Freq: Two times a day (BID) | ORAL | 0 refills | Status: DC

## 2022-03-30 NOTE — Progress Notes (Addendum)
Subjective:  Patient ID: Vicki Martin, female    DOB: 1950-12-07  Age: 71 y.o. MRN: [id number]  CC: Hypertension   HPI Juliahna S Berg presents for f/up -  She complains of a 3-week history of chest pain and shortness of breath.  The chest pain is substernal and radiates to her back.  She has also not taken some of her meds over the last few weeks.  She is not sure which one she is taking and is not taking.  She said she read some of the side effects and became concerned about some of her meds so she decided to stop taking them and some other meds she ran out.  She continues to mourn the death of her son 2 years ago.  He committed suicide.  Outpatient Medications Prior to Visit  Medication Sig Dispense Refill   potassium chloride SA (KLOR-CON M) 20 MEQ tablet Take 1 tablet by mouth twice daily 180 tablet 0   atorvastatin (LIPITOR) 40 MG tablet Take 1 tablet (40 mg total) by mouth daily. 90 tablet 1   carvedilol (COREG) 12.5 MG tablet TAKE 1 TABLET BY MOUTH TWICE DAILY WITH A MEAL 180 tablet 0   clonazePAM (KLONOPIN) 1 MG tablet Take 1 tablet (1 mg total) by mouth 2 (two) times daily as needed. 60 tablet 2   DULoxetine (CYMBALTA) 60 MG capsule Take 1 capsule by mouth once daily 90 capsule 0   gabapentin (NEURONTIN) 300 MG capsule Take 1 capsule by mouth at bedtime 90 capsule 0   No facility-administered medications prior to visit.    ROS Review of Systems  Constitutional:  Negative for appetite change, chills, diaphoresis, fatigue and fever.  HENT: Negative.    Eyes: Negative.   Respiratory:  Positive for shortness of breath. Negative for cough, choking, chest tightness, wheezing and stridor.   Cardiovascular:  Positive for chest pain. Negative for palpitations and leg swelling.  Gastrointestinal: Negative.  Negative for abdominal pain, diarrhea, nausea and vomiting.  Endocrine: Negative.   Genitourinary: Negative.  Negative for difficulty urinating.  Musculoskeletal: Negative.   Negative for arthralgias and myalgias.  Skin: Negative.   Neurological: Negative.  Negative for dizziness, weakness, light-headedness, numbness and headaches.  Hematological:  Negative for adenopathy. Does not bruise/bleed easily.  Psychiatric/Behavioral:  Positive for confusion, decreased concentration, dysphoric mood and sleep disturbance. Negative for agitation, behavioral problems, hallucinations, self-injury and suicidal ideas. The patient is nervous/anxious. The patient is not hyperactive.     Objective:  BP (!) 144/94 (BP Location: Left Arm, Patient Position: Sitting, Cuff Size: Large)   Pulse 100   Temp 98.2 F (36.8 C) (Oral)   Resp 16   Ht '5\' 1"'$  (1.549 m)   Wt 146 lb (66.2 kg)   SpO2 98%   BMI 27.59 kg/m   BP Readings from Last 3 Encounters:  03/30/22 (!) 144/94  08/09/21 (!) 142/78  03/24/21 118/60    Wt Readings from Last 3 Encounters:  03/30/22 146 lb (66.2 kg)  08/09/21 146 lb (66.2 kg)  03/24/21 146 lb 3.2 oz (66.3 kg)    Physical Exam Constitutional:      Appearance: She is ill-appearing.  HENT:     Nose: Nose normal.     Mouth/Throat:     Mouth: Mucous membranes are moist.  Eyes:     General: No scleral icterus.    Conjunctiva/sclera: Conjunctivae normal.  Cardiovascular:     Rate and Rhythm: Tachycardia present.     Pulses: Normal  pulses.     Heart sounds: No murmur heard.    No friction rub. No gallop.  Pulmonary:     Effort: Pulmonary effort is normal.     Breath sounds: No stridor. No wheezing, rhonchi or rales.  Chest:     Comments: EKG- ST, 103 bpm LAD Anterior/inferior/lateral infract patterns are old ST elevations are old Unchanged with the exception of the tachycardia  Abdominal:     General: Abdomen is flat.     Palpations: There is no mass.     Tenderness: There is no abdominal tenderness. There is no guarding.     Hernia: No hernia is present.  Musculoskeletal:        General: Normal range of motion.     Cervical back: Neck  supple.     Right lower leg: No edema.     Left lower leg: No edema.  Skin:    General: Skin is warm and dry.  Neurological:     General: No focal deficit present.     Mental Status: She is alert.  Psychiatric:        Attention and Perception: Perception normal. She is inattentive.        Mood and Affect: Mood is anxious and depressed. Mood is not elated. Affect is tearful. Affect is not blunt or flat.        Speech: She is communicative. Speech is tangential. Speech is not rapid and pressured, delayed or slurred.        Behavior: Behavior is slowed and withdrawn. Behavior is not agitated or aggressive.        Thought Content: Thought content normal. Thought content is not paranoid or delusional. Thought content does not include homicidal or suicidal ideation.        Cognition and Memory: Cognition normal.        Judgment: Judgment normal.     Lab Results  Component Value Date   WBC 6.6 03/30/2022   HGB 14.6 03/30/2022   HCT 44.9 03/30/2022   PLT 308.0 03/30/2022   GLUCOSE 133 (H) 03/30/2022   CHOL 169 08/09/2021   TRIG 172.0 (H) 08/09/2021   HDL 54.90 08/09/2021   LDLDIRECT 40.0 10/24/2018   LDLCALC 79 08/09/2021   ALT 22 03/30/2022   AST 25 03/30/2022   NA 139 03/30/2022   K 3.8 03/30/2022   CL 102 03/30/2022   CREATININE 0.91 03/30/2022   BUN 10 03/30/2022   CO2 25 03/30/2022   TSH 2.84 03/30/2022   HGBA1C 6.0 03/30/2022    CT Soft Tissue Neck W Contrast  Result Date: 06/11/2020 CLINICAL DATA:  Right neck swelling. EXAM: CT NECK WITH CONTRAST TECHNIQUE: Multidetector CT imaging of the neck was performed using the standard protocol following the bolus administration of intravenous contrast. CONTRAST:  51m OMNIPAQUE IOHEXOL 300 MG/ML  SOLN COMPARISON:  None. FINDINGS: Pharynx and larynx: There is fullness of the right submandibular space with a low-attenuation collection at the level of the angle of the mandible (5:38), likely a subperiosteal abscess. The source is  likely the carious most posterior remaining right mandibular molar. Salivary glands: Right submandibular gland is enlarged. No sialolithiasis. Parotid and sublingual glands are normal. Thyroid: Normal Lymph nodes: Multiple right level 1B and level 2A lymph nodes which are likely reactive. Vascular: Negative. Limited intracranial: Negative. Visualized orbits: Negative. Mastoids and visualized paranasal sinuses: Clear. Skeleton: No acute or aggressive process. Upper chest: Negative. Other: Lower right facial soft tissue swelling with inflammatory induration and thickening of  the platysma. IMPRESSION: 1. Subperiosteal abscess of the right retromolar trigone, likely arising from carious most posterior remaining right mandibular molar. 2. Reactive right level 1B and level 2A lymphadenopathy. Reactive swelling of the right submandibular gland. Electronically Signed   By: Ulyses Jarred M.D.   On: 06/11/2020 22:28    Assessment & Plan:   Talena was seen today for hypertension.  Diagnoses and all orders for this visit:  Essential hypertension- Her blood pressure is not adequately well controlled and she is tachycardic.  Labs are negative for secondary causes and endorgan damage.  Will restart carvedilol. -     CBC with Differential/Platelet; Future -     Thyroid Panel With TSH; Future -     Hepatic function panel; Future -     Basic metabolic panel; Future -     carvedilol (COREG) 12.5 MG tablet; Take 1 tablet (12.5 mg total) by mouth 2 (two) times daily with a meal. -     EKG 12-Lead -     Basic metabolic panel -     Hepatic function panel -     Thyroid Panel With TSH -     CBC with Differential/Platelet  Prediabetes- Her A1c is 6.0%. -     Hepatic function panel; Future -     Hemoglobin A1c; Future -     Basic metabolic panel; Future -     Basic metabolic panel -     Hemoglobin A1c -     Hepatic function panel  Tachycardia- Will restart carvedilol. -     CBC with Differential/Platelet;  Future -     Thyroid Panel With TSH; Future -     Hepatic function panel; Future -     Basic metabolic panel; Future -     carvedilol (COREG) 12.5 MG tablet; Take 1 tablet (12.5 mg total) by mouth 2 (two) times daily with a meal. -     EKG 12-Lead -     Basic metabolic panel -     Hepatic function panel -     Thyroid Panel With TSH -     CBC with Differential/Platelet -     CT Angio Chest W/Cm &/Or Wo Cm; Future  Panic anxiety syndrome- Will restart clonazepam and duloxetine. -     clonazePAM (KLONOPIN) 1 MG tablet; Take 1 tablet (1 mg total) by mouth 2 (two) times daily as needed. -     DULoxetine (CYMBALTA) 60 MG capsule; Take 1 capsule (60 mg total) by mouth daily.  Severe episode of recurrent major depressive disorder, without psychotic features ([place]) -     DULoxetine (CYMBALTA) 60 MG capsule; Take 1 capsule (60 mg total) by mouth daily.  Hyperlipidemia LDL goal <130 -     atorvastatin (LIPITOR) 40 MG tablet; Take 1 tablet (40 mg total) by mouth daily.  Flu vaccine need -     Flu Vaccine QUAD High Dose(Fluad)  Precordial pain- Her EKG is unchanged with the exception of tachycardia.  Her troponin and BNP are normal but her D-dimer is slightly elevated.  I recommended that she undergo a CT angio to evaluate for pulmonary embolus and TAA. -     Troponin I (High Sensitivity); Future -     D-dimer, quantitative; Future -     Brain natriuretic peptide; Future -     Brain natriuretic peptide -     D-dimer, quantitative -     Troponin I (High Sensitivity) -     CT Angio  Chest W/Cm &/Or Wo Cm; Future  D-dimer, elevated -     CT Angio Chest W/Cm &/Or Wo Cm; Future   I have discontinued Sumire S. Zenner's gabapentin. I have also changed her carvedilol and DULoxetine. Additionally, I am having her maintain her potassium chloride SA, clonazePAM, and atorvastatin.  Meds ordered this encounter  Medications   carvedilol (COREG) 12.5 MG tablet    Sig: Take 1 tablet (12.5 mg total) by  mouth 2 (two) times daily with a meal.    Dispense:  180 tablet    Refill:  0   clonazePAM (KLONOPIN) 1 MG tablet    Sig: Take 1 tablet (1 mg total) by mouth 2 (two) times daily as needed.    Dispense:  60 tablet    Refill:  2   DULoxetine (CYMBALTA) 60 MG capsule    Sig: Take 1 capsule (60 mg total) by mouth daily.    Dispense:  90 capsule    Refill:  0   atorvastatin (LIPITOR) 40 MG tablet    Sig: Take 1 tablet (40 mg total) by mouth daily.    Dispense:  90 tablet    Refill:  1   I spent 50 minutes in preparing to see the patient by review of recent labs and prior EKG, obtaining and reviewing separately obtained history, communicating with the patient, ordering medications, tests or procedures, and documenting clinical information in the EHR including the differential Dx, treatment, and any further evaluation and management of multiple complex medical issues.     Follow-up: Return in about 4 weeks (around 04/27/2022).  Scarlette Calico, MD

## 2022-03-30 NOTE — Patient Instructions (Signed)
Sinus Tachycardia  Sinus tachycardia is a fast heartbeat. In sinus tachycardia, the heart beats more than 100 times a minute. Sinus tachycardia starts in the part of the heart called the sinoatrial (SA) node. Sinus tachycardia may be harmless, or it may be a sign of a serious condition. What are the causes? This condition may be caused by: Exercise or exertion. A fever. Pain. Loss of body fluids (dehydration). Severe bleeding (hemorrhage). Anxiety and stress. Certain substances, including: Alcohol. Caffeine. Tobacco and nicotine products. Cold medicines. Illegal drugs. Medical conditions including: Heart disease. An infection. An overactive thyroid (hyperthyroidism). A lack of red blood cells (anemia). What are the signs or symptoms? Symptoms of this condition include: A feeling that the heart is beating fast or unevenly (palpitations). Suddenly noticing your heartbeat (cardiac awareness). Lightheadedness. Tiredness (fatigue). Shortness of breath. Chest pain. Nausea. Fainting. How is this diagnosed? This condition is diagnosed with: A physical exam. Tests or monitoring, such as: Blood tests. An electrocardiogram (ECG). This test measures the electrical activity of the heart. Ambulatory cardiac monitor. This records your heartbeats for 24 hours or more. You may be referred to a heart specialist (cardiologist). How is this treated? Treatment for this condition depends on the cause. Treatment may involve: Treating the underlying condition. Taking new medicines or changing your current medicines as told by your health care provider. Making changes to your diet or lifestyle. Follow these instructions at home: Lifestyle  Do not use any products that contain nicotine or tobacco. These products include cigarettes, chewing tobacco, and vaping devices, such as e-cigarettes. If you need help quitting, ask your health care provider. Do not use illegal drugs, such as  cocaine. Learn relaxation methods to help you when you get stressed or anxious. These include deep breathing. Avoid caffeine or other stimulants, including herbal stimulants that are found in energy drinks. Alcohol use  Do not drink alcohol if: Your health care provider tells you not to drink. You are pregnant, may be pregnant, or are planning to become pregnant. If you drink alcohol: Limit how much you have to: 0-1 drink a day for women. 0-2 drinks a day for men. Know how much alcohol is in your drink. In the [place] one drink equals one 12 oz bottle of beer (355 mL), one 5 oz glass of wine (148 mL), or one 1 oz glass of hard liquor (44 mL). General instructions Drink enough fluids to keep your urine pale yellow. Take over-the-counter and prescription medicines only as told by your health care provider. Ask your health care provider about taking vitamins, herbs, and supplements. Contact a health care provider if: You have vomiting or diarrhea that does not go away. You have a fever. You have weakness or dizziness. You feel faint. Get help right away if: You have pain in your chest, upper arms, jaw, or neck. You have palpitations that do not go away. Summary In sinus tachycardia, the heart beats more than 100 times a minute. Sinus tachycardia may be harmless, or it may be a sign of a serious condition. Treatment for this condition depends on the cause or the underlying condition. Get help right away if you have pain in your chest, upper arms, jaw, or neck. This information is not intended to replace advice given to you by your health care provider. Make sure you discuss any questions you have with your health care provider. Document Revised: 09/13/2021 Document Reviewed: 09/13/2021 Elsevier Patient Education  2023 Elsevier Inc.  

## 2022-03-31 ENCOUNTER — Ambulatory Visit
Admission: RE | Admit: 2022-03-31 | Discharge: 2022-03-31 | Disposition: A | Discharge status: Home / Self Care | Payer: Medicare HMO | Source: Ambulatory Visit | Attending: Internal Medicine | Admitting: Internal Medicine

## 2022-03-31 ENCOUNTER — Other Ambulatory Visit: Payer: Self-pay | Admitting: Internal Medicine

## 2022-03-31 DIAGNOSIS — R000 Tachycardia, unspecified: Secondary | ICD-10-CM | POA: Diagnosis not present

## 2022-03-31 DIAGNOSIS — R7989 Other specified abnormal findings of blood chemistry: Secondary | ICD-10-CM

## 2022-03-31 DIAGNOSIS — I361 Nonrheumatic tricuspid (valve) insufficiency: Secondary | ICD-10-CM

## 2022-03-31 DIAGNOSIS — R911 Solitary pulmonary nodule: Secondary | ICD-10-CM | POA: Diagnosis not present

## 2022-03-31 DIAGNOSIS — R072 Precordial pain: Secondary | ICD-10-CM

## 2022-03-31 DIAGNOSIS — J8410 Pulmonary fibrosis, unspecified: Secondary | ICD-10-CM | POA: Diagnosis not present

## 2022-03-31 LAB — THYROID PANEL WITH TSH
Free Thyroxine Index: 2.3 (ref 1.4–3.8)
T3 Uptake: 24 % (ref 22–35)
T4, Total: 9.7 ug/dL (ref 5.1–11.9)
TSH: 2.84 m[IU]/L (ref 0.40–4.50)

## 2022-03-31 LAB — D-DIMER, QUANTITATIVE: D-Dimer, Quant: 1.28 mcg/mL FEU — ABNORMAL HIGH (ref <0.50)

## 2022-03-31 MED ORDER — IOPAMIDOL (ISOVUE-370) INJECTION 76%
75.0000 mL | Freq: Once | INTRAVENOUS | Status: AC | PRN
  Administered 2022-03-31: 75 mL via INTRAVENOUS

## 2022-04-02 ENCOUNTER — Other Ambulatory Visit: Payer: Self-pay | Admitting: Internal Medicine

## 2022-04-02 DIAGNOSIS — E876 Hypokalemia: Secondary | ICD-10-CM

## 2022-04-02 DIAGNOSIS — I10 Essential (primary) hypertension: Secondary | ICD-10-CM

## 2022-04-04 ENCOUNTER — Ambulatory Visit: Payer: Self-pay | Admitting: Licensed Clinical Social Worker

## 2022-04-04 NOTE — Patient Outreach (Signed)
  Care Coordination  Initial Visit Note   04/04/2022 Name: DAJSHA MASSARO MRN: [id number] DOB: 10-Oct-1950  Mikal S Abruzzese is a 71 y.o. year old female who sees Janith Lima, MD for primary care. I spoke with  Tylene S Cisek by phone today.  What matters to the patients health and wellness today?    phone appointment scheduled to introduce Care Coordination   SDOH assessments and interventions completed:  No    Care Coordination Interventions Activated:  No  Care Coordination Interventions:  No, not indicated   Follow up plan: Follow up call scheduled for 04/05/2022    Encounter Outcome:  Pt. Visit Completed   Casimer Lanius, [place] [phone]

## 2022-04-04 NOTE — Patient Instructions (Signed)
    It was a pleasure speaking with you today. Per your request a Care Coordination phone appointment is scheduled 04/05/22  Casimer Lanius, [place] [phone]

## 2022-04-05 ENCOUNTER — Ambulatory Visit: Payer: Self-pay | Admitting: Licensed Clinical Social Worker

## 2022-04-05 DIAGNOSIS — F332 Major depressive disorder, recurrent severe without psychotic features: Secondary | ICD-10-CM

## 2022-04-05 NOTE — Patient Outreach (Signed)
  Care Coordination  Initial Visit Note   04/05/2022 Name: Vicki Martin MRN: [id number] DOB: 05/14/1951  Vicki Martin is a 71 y.o. year old female who sees Janith Lima, MD for primary care. I spoke with  Vicki Martin by phone today.  What matters to the patients health and wellness today?  Managing her mental health  Patient continues to experience symptoms of depression which seems to be exacerbated by long-term grief..  She recognizes that her mental health in interfering with her physical health. She wants to move beyond the pain of dealing with the deaths of her loved ones.  Recommendation: Patient may benefit from, and is in agreement to Allow LCSW to make referral for ongoing therapy.     Goals Addressed             This Visit's Progress    Care Coordination Activities       Care Coordination Interventions: Motivational Interviewing employed Depression screen reviewed  Solution-Focused Strategies employed:  Active listening / Reflection utilized  Provided general psycho-education for mental health needs  Reviewed mental health medications and discussed importance of compliance: has restarted Cymbalta and Klonopin ( reports no missed doses)  Participation in counseling encouraged :   Provided EMMI education information on :Depression Discussed referral options to connect for ongoing therapy:   Made referral to [facility] assessments and interventions completed:  Yes  SDOH Interventions Today    Flowsheet Row Most Recent Value  SDOH Interventions   Food Insecurity Interventions Intervention Not Indicated  Transportation Interventions Intervention Not Indicated  Depression Interventions/Treatment  Counseling        Care Coordination Interventions Activated:  Yes  Care Coordination Interventions:  Yes, provided   Follow up plan:  Referral made to [facility] Follow up call scheduled for 2 weeks (  04/19/22    Encounter Outcome:  Pt. Visit Completed   Casimer Lanius, [place] [phone]

## 2022-04-05 NOTE — Patient Instructions (Signed)
Visit Information  Thank you for taking time to visit with me today. Please don't hesitate to contact me if I can be of assistance to you.   Following are the goals we discussed today:   Goals Addressed             This Visit's Progress    Care Coordination Activities       Care Coordination Interventions: Motivational Interviewing employed Depression screen reviewed  Solution-Focused Strategies employed:  Active listening / Reflection utilized  Provided general psycho-education for mental health needs  Reviewed mental health medications and discussed importance of compliance: has restarted Cymbalta and Klonopin ( reports no missed doses)  Participation in counseling encouraged :   Provided EMMI education information on :Depression Discussed referral options to connect for ongoing therapy:   Made referral to [facility] next appointment is by telephone on 04/19/22 at 3:00  Please call the care guide team at [phone] if you need to cancel or reschedule your appointment.   If you are experiencing a Mental Health or [place] or need someone to talk to, please call the Suicide and Crisis Lifeline: 988 call the [place] National Suicide Prevention Lifeline: 519-756-9194 or TTY: (929)720-0777 TTY (617) 576-4456) to talk to a trained counselor call 1-800-273-TALK (toll free, 24 hour hotline) go to [facility] [address] [place] [phone])   Patient verbalizes understanding of instructions and care plan provided today and agrees to view in [place]. Active MyChart status and patient understanding of how to access instructions and care plan via MyChart confirmed with patient.     Casimer Lanius, [place] [phone]

## 2022-04-12 ENCOUNTER — Ambulatory Visit (INDEPENDENT_AMBULATORY_CARE_PROVIDER_SITE_OTHER): Payer: Medicare HMO

## 2022-04-12 DIAGNOSIS — I361 Nonrheumatic tricuspid (valve) insufficiency: Secondary | ICD-10-CM

## 2022-04-12 LAB — ECHOCARDIOGRAM COMPLETE
Area-P 1/2: 3.31 cm2
S' Lateral: 2.19 cm

## 2022-04-19 ENCOUNTER — Encounter: Payer: Medicare HMO | Admitting: Licensed Clinical Social Worker

## 2022-04-19 ENCOUNTER — Ambulatory Visit: Payer: Self-pay | Admitting: Licensed Clinical Social Worker

## 2022-04-19 NOTE — Patient Instructions (Signed)
Visit Information  Thank you for taking time to visit with me today. Please don't hesitate to contact me if I can be of assistance to you.   Following are the goals we discussed today:   Goals Addressed             This Visit's Progress    Care Coordination Activities       Care Coordination Interventions: Motivational Interviewing employed Solution-Focused Strategies employed:  Problem [place] strategies reviewed Participation in counseling encouraged : assessed for barriers with follow up Provided EMMI education information on :Depression Made referral to [facility]  they have not been able to reach patient            Our next appointment is by telephone on 04/25/22 at 2:30  Please call the care guide team at [phone] if you need to cancel or reschedule your appointment.   If you are experiencing a Mental Health or [place] or need someone to talk to, please call the Suicide and Crisis Lifeline: 988 call the [place] National Suicide Prevention Lifeline: 443 180 9548 or TTY: 805-230-3529 TTY 5624790359) to talk to a trained counselor call 1-800-273-TALK (toll free, 24 hour hotline)   Patient verbalizes understanding of instructions and care plan provided today and agrees to view in [place]. Active MyChart status and patient understanding of how to access instructions and care plan via MyChart confirmed with patient.     Casimer Lanius, [place] [phone]

## 2022-04-19 NOTE — Patient Outreach (Signed)
  Care Coordination  Follow Up Visit Note   04/19/2022 Name: Vicki Martin MRN: [id number] DOB: [birth year]  Vicki Martin is a 71 y.o. year old female who sees Vicki Lima, MD for primary care. I spoke with  Vicki Martin by phone today.  What matters to the patients health and wellness today?   Patient received message from [facility] to schedule appointment, she has not contacted them .   Recommendation: Patient may benefit from, and is in agreement to To call [facility] to schedule appointment.    Goals Addressed             This Visit's Progress    Care Coordination Activities       Care Coordination Interventions: Motivational Interviewing employed Solution-Focused Strategies employed:  Problem [place] strategies reviewed Participation in counseling encouraged : assessed for barriers with follow up Provided EMMI education information on :Depression Made referral to [facility]  they have not been able to reach patient            SDOH assessments and interventions completed:  No   Care Coordination Interventions:  Yes, provided   Follow up plan: Follow up call scheduled for 04/25/22    Encounter Outcome:  Pt. Visit Completed    Casimer Lanius, [place] [phone]

## 2022-04-25 ENCOUNTER — Ambulatory Visit: Payer: Self-pay | Admitting: Licensed Clinical Social Worker

## 2022-04-25 NOTE — Patient Outreach (Signed)
  Care Coordination   Follow Up Visit Note   04/25/2022 Name: Vicki Martin MRN: [id number] DOB: [birth year]  Vicki Martin is a 71 y.o. year old female who sees Janith Lima, MD for primary care. I spoke with  Vicki Martin by phone today.  What matters to the patients health and wellness today?    Patient continues to experience symptoms of depression associated with complicated grief. low energy and not wanted to do anything Has not been able to follow up with referral [place] placed with [facility] and is concerned about co-pay for counseling Recommendation: Patient may benefit from, and is in agreement to Keep appointment 11/30 with Dr. Ronnald Ramp Call [employer] to inquire about co-pay   Goals Addressed             This Visit's Progress    Care Coordination Activities connect for therapy       Care Coordination Interventions: Motivational Interviewing employed Solution-Focused Strategies employed:  Active listening / Reflection utilized  Emotional Support Provided Problem [place] strategies reviewed Participation in counseling encouraged : assessed for barriers with connecting to Vicki Martin Has not reviewed previous EMMI education  Provided EMMI education information on :Depression              SDOH assessments and interventions completed:  No   Care Coordination Interventions:  Yes, provided   Follow up plan: Follow up call scheduled for 05/09/22 based on patient's request    Encounter Outcome:  Pt. Visit Completed   Vicki Martin [phone]

## 2022-04-25 NOTE — Patient Instructions (Signed)
Visit Information  Thank you for taking time to visit with me today. Please don't hesitate to contact me if I can be of assistance to you.   Following are the goals we discussed today:   Goals Addressed             This Visit's Progress    Care Coordination Activities connect for therapy       Care Coordination Interventions: Motivational Interviewing employed Solution-Focused Strategies employed:  Active listening / Reflection utilized  Emotional Support Provided Problem Superior strategies reviewed Participation in counseling encouraged : assessed for barriers with connecting to [place] Has not reviewed previous EMMI education  Provided EMMI education information on :Depression              Our next appointment is by telephone on 05/09/22 at 3:00  Please call the care guide team at [phone] if you need to cancel or reschedule your appointment.   If you are experiencing a Mental Health or [place] or need someone to talk to, please call the Suicide and Crisis Lifeline: 988 call the [place] National Suicide Prevention Lifeline: 905 296 4965 or TTY: (442) 785-2532 TTY (479)537-0678) to talk to a trained counselor call 1-800-273-TALK (toll free, 24 hour hotline) go to [facility] [address] [place] [phone])   Patient verbalizes understanding of instructions and care plan provided today and agrees to view in [place]. Active MyChart status and patient understanding of how to access instructions and care plan via MyChart confirmed with patient.     Casimer Lanius, [place] [phone]

## 2022-04-27 ENCOUNTER — Ambulatory Visit (INDEPENDENT_AMBULATORY_CARE_PROVIDER_SITE_OTHER): Payer: Medicare HMO | Admitting: Internal Medicine

## 2022-04-27 ENCOUNTER — Encounter: Payer: Self-pay | Admitting: Internal Medicine

## 2022-04-27 VITALS — BP 128/66 | HR 70 | Temp 98.3°F | Resp 16 | Ht 61.0 in | Wt 149.0 lb

## 2022-04-27 DIAGNOSIS — I10 Essential (primary) hypertension: Secondary | ICD-10-CM | POA: Diagnosis not present

## 2022-04-27 DIAGNOSIS — I361 Nonrheumatic tricuspid (valve) insufficiency: Secondary | ICD-10-CM

## 2022-04-27 NOTE — Progress Notes (Signed)
Subjective:  Patient ID: Vicki Martin, female    DOB: 1951-03-06  Age: 71 y.o. MRN: [id number]  CC: Hypertension   HPI Yaretzy S Pfluger presents for f/up -  She feels much better.  She only gets short of breath when she is emotionally stressed.  She denies chest pain, diaphoresis, or palpitations.  Outpatient Medications Prior to Visit  Medication Sig Dispense Refill   atorvastatin (LIPITOR) 40 MG tablet Take 1 tablet (40 mg total) by mouth daily. 90 tablet 1   carvedilol (COREG) 12.5 MG tablet Take 1 tablet (12.5 mg total) by mouth 2 (two) times daily with a meal. 180 tablet 0   clonazePAM (KLONOPIN) 1 MG tablet Take 1 tablet (1 mg total) by mouth 2 (two) times daily as needed. 60 tablet 2   DULoxetine (CYMBALTA) 60 MG capsule Take 1 capsule (60 mg total) by mouth daily. 90 capsule 0   potassium chloride SA (KLOR-CON M) 20 MEQ tablet Take 1 tablet by mouth twice daily 180 tablet 0   No facility-administered medications prior to visit.    ROS Review of Systems  Constitutional: Negative.  Negative for diaphoresis and fatigue.  HENT: Negative.    Eyes: Negative.   Respiratory: Negative.  Negative for cough, chest tightness, shortness of breath and wheezing.   Cardiovascular:  Negative for chest pain, palpitations and leg swelling.  Gastrointestinal:  Negative for abdominal pain, constipation and vomiting.  Endocrine: Negative.   Genitourinary:  Negative for difficulty urinating.  Musculoskeletal: Negative.   Skin: Negative.   Allergic/Immunologic: Negative.   Neurological: Negative.   Hematological:  Negative for adenopathy. Does not bruise/bleed easily.  Psychiatric/Behavioral: Negative.      Objective:  BP 128/66 (BP Location: Left Arm, Patient Position: Sitting, Cuff Size: Large)   Pulse 70   Temp 98.3 F (36.8 C) (Oral)   Resp 16   Ht '5\' 1"'$  (1.549 m)   Wt 149 lb (67.6 kg)   SpO2 97%   BMI 28.15 kg/m   BP Readings from Last 3 Encounters:  04/27/22 128/66   03/30/22 (!) 144/94  08/09/21 (!) 142/78    Wt Readings from Last 3 Encounters:  04/27/22 149 lb (67.6 kg)  03/30/22 146 lb (66.2 kg)  08/09/21 146 lb (66.2 kg)    Physical Exam Vitals reviewed.  HENT:     Nose: Nose normal.     Mouth/Throat:     Mouth: Mucous membranes are moist.  Eyes:     General: No scleral icterus.    Conjunctiva/sclera: Conjunctivae normal.  Cardiovascular:     Rate and Rhythm: Normal rate and regular rhythm.     Heart sounds: No murmur heard. Pulmonary:     Effort: Pulmonary effort is normal.     Breath sounds: No stridor. No wheezing, rhonchi or rales.  Abdominal:     General: Abdomen is flat.     Palpations: There is no mass.     Tenderness: There is no abdominal tenderness. There is no guarding.     Hernia: No hernia is present.  Musculoskeletal:        General: Normal range of motion.     Cervical back: Neck supple.     Right lower leg: No edema.     Left lower leg: No edema.  Lymphadenopathy:     Cervical: No cervical adenopathy.  Skin:    General: Skin is warm.  Neurological:     General: No focal deficit present.     Mental Status:  She is alert.  Psychiatric:        Mood and Affect: Mood normal.        Behavior: Behavior normal.     Lab Results  Component Value Date   WBC 6.6 03/30/2022   HGB 14.6 03/30/2022   HCT 44.9 03/30/2022   PLT 308.0 03/30/2022   GLUCOSE 133 (H) 03/30/2022   CHOL 169 08/09/2021   TRIG 172.0 (H) 08/09/2021   HDL 54.90 08/09/2021   LDLDIRECT 40.0 10/24/2018   LDLCALC 79 08/09/2021   ALT 22 03/30/2022   AST 25 03/30/2022   NA 139 03/30/2022   K 3.8 03/30/2022   CL 102 03/30/2022   CREATININE 0.91 03/30/2022   BUN 10 03/30/2022   CO2 25 03/30/2022   TSH 2.84 03/30/2022   HGBA1C 6.0 03/30/2022    CT Angio Chest W/Cm &/Or Wo Cm  Result Date: 03/31/2022 CLINICAL DATA:  Chest pain, tachycardia, shortness of breath EXAM: CT ANGIOGRAPHY CHEST WITH CONTRAST TECHNIQUE: Multidetector CT imaging of  the chest was performed using the standard protocol during bolus administration of intravenous contrast. Multiplanar CT image reconstructions and MIPs were obtained to evaluate the vascular anatomy. RADIATION DOSE REDUCTION: This exam was performed according to the departmental dose-optimization program which includes automated exposure control, adjustment of the mA and/or kV according to patient size and/or use of iterative reconstruction technique. CONTRAST:  37m ISOVUE-370 IOPAMIDOL (ISOVUE-370) INJECTION 76% COMPARISON:  CT chest done on 11/25/2019 FINDINGS: Cardiovascular: There is homogeneous enhancement in thoracic aorta. There are no intraluminal filling defects in pulmonary artery branches. Mediastinum/Nodes: No significant lymphadenopathy is seen. Thyroid is smaller than usual in size. Lungs/Pleura: There is no focal pulmonary consolidation. There is calcified granuloma in right upper lobe. In image 97 of series 7, there is 4 mm nodular density in right lower lobe which appears stable. No follow-up imaging is recommended. There is no pleural effusion or pneumothorax. Upper Abdomen: Surgical clips are seen in gallbladder fossa. There is reflux of contrast into inferior vena cava and hepatic veins suggesting possible tricuspid incompetence. Musculoskeletal: No acute findings are seen. Review of the MIP images confirms the above findings. IMPRESSION: There is no evidence of pulmonary artery embolism. There is no evidence of thoracic aortic dissection. There is no focal pulmonary consolidation. Other findings as described in the body of the report. Electronically Signed   By: PElmer PickerM.D.   On: 03/31/2022 16:20    Assessment & Plan:   Candise was seen today for hypertension.  Diagnoses and all orders for this visit:  Essential hypertension- Her blood pressure is well-controlled.  Nonrheumatic tricuspid valve regurgitation- Recent echocardiogram showed mild tricuspid regurgitation and  trivial mitral regurgitation.   I am having Rubi S. Blaze maintain her carvedilol, clonazePAM, DULoxetine, atorvastatin, and potassium chloride SA.  No orders of the defined types were placed in this encounter.    Follow-up: Return in about 6 months (around 10/26/2022).  TScarlette Calico MD

## 2022-04-27 NOTE — Patient Instructions (Signed)
Hypertension, Adult High blood pressure (hypertension) is when the force of blood pumping through the arteries is too strong. The arteries are the blood vessels that carry blood from the heart throughout the body. Hypertension forces the heart to work harder to pump blood and may cause arteries to become narrow or stiff. Untreated or uncontrolled hypertension can lead to a heart attack, heart failure, a stroke, kidney disease, and other problems. A blood pressure reading consists of a higher number over a lower number. Ideally, your blood pressure should be below 120/80. The first ("top") number is called the systolic pressure. It is a measure of the pressure in your arteries as your heart beats. The second ("bottom") number is called the diastolic pressure. It is a measure of the pressure in your arteries as the heart relaxes. What are the causes? The exact cause of this condition is not known. There are some conditions that result in high blood pressure. What increases the risk? Certain factors may make you more likely to develop high blood pressure. Some of these risk factors are under your control, including: Smoking. Not getting enough exercise or physical activity. Being overweight. Having too much fat, sugar, calories, or salt (sodium) in your diet. Drinking too much alcohol. Other risk factors include: Having a personal history of heart disease, diabetes, high cholesterol, or kidney disease. Stress. Having a family history of high blood pressure and high cholesterol. Having obstructive sleep apnea. Age. The risk increases with age. What are the signs or symptoms? High blood pressure may not cause symptoms. Very high blood pressure (hypertensive crisis) may cause: Headache. Fast or irregular heartbeats (palpitations). Shortness of breath. Nosebleed. Nausea and vomiting. Vision changes. Severe chest pain, dizziness, and seizures. How is this diagnosed? This condition is diagnosed by  measuring your blood pressure while you are seated, with your arm resting on a flat surface, your legs uncrossed, and your feet flat on the floor. The cuff of the blood pressure monitor will be placed directly against the skin of your upper arm at the level of your heart. Blood pressure should be measured at least twice using the same arm. Certain conditions can cause a difference in blood pressure between your right and left arms. If you have a high blood pressure reading during one visit or you have normal blood pressure with other risk factors, you may be asked to: Return on a different day to have your blood pressure checked again. Monitor your blood pressure at home for 1 week or longer. If you are diagnosed with hypertension, you may have other blood or imaging tests to help your health care provider understand your overall risk for other conditions. How is this treated? This condition is treated by making healthy lifestyle changes, such as eating healthy foods, exercising more, and reducing your alcohol intake. You may be referred for counseling on a healthy diet and physical activity. Your health care provider may prescribe medicine if lifestyle changes are not enough to get your blood pressure under control and if: Your systolic blood pressure is above 130. Your diastolic blood pressure is above 80. Your personal target blood pressure may vary depending on your medical conditions, your age, and other factors. Follow these instructions at home: Eating and drinking  Eat a diet that is high in fiber and potassium, and low in sodium, added sugar, and fat. An example of this eating plan is called the DASH diet. DASH stands for Dietary Approaches to Stop Hypertension. To eat this way: Eat   plenty of fresh fruits and vegetables. Try to fill one half of your plate at each meal with fruits and vegetables. Eat whole grains, such as whole-wheat pasta, brown rice, or whole-grain bread. Fill about one  fourth of your plate with whole grains. Eat or drink low-fat dairy products, such as skim milk or low-fat yogurt. Avoid fatty cuts of meat, processed or cured meats, and poultry with skin. Fill about one fourth of your plate with lean proteins, such as fish, chicken without skin, beans, eggs, or tofu. Avoid pre-made and processed foods. These tend to be higher in sodium, added sugar, and fat. Reduce your daily sodium intake. Many people with hypertension should eat less than 1,500 mg of sodium a day. Do not drink alcohol if: Your health care provider tells you not to drink. You are pregnant, may be pregnant, or are planning to become pregnant. If you drink alcohol: Limit how much you have to: 0-1 drink a day for women. 0-2 drinks a day for men. Know how much alcohol is in your drink. In the [place] one drink equals one 12 oz bottle of beer (355 mL), one 5 oz glass of wine (148 mL), or one 1 oz glass of hard liquor (44 mL). Lifestyle  Work with your health care provider to maintain a healthy body weight or to lose weight. Ask what an ideal weight is for you. Get at least 30 minutes of exercise that causes your heart to beat faster (aerobic exercise) most days of the week. Activities may include walking, swimming, or biking. Include exercise to strengthen your muscles (resistance exercise), such as Pilates or lifting weights, as part of your weekly exercise routine. Try to do these types of exercises for 30 minutes at least 3 days a week. Do not use any products that contain nicotine or tobacco. These products include cigarettes, chewing tobacco, and vaping devices, such as e-cigarettes. If you need help quitting, ask your health care provider. Monitor your blood pressure at home as told by your health care provider. Keep all follow-up visits. This is important. Medicines Take over-the-counter and prescription medicines only as told by your health care provider. Follow directions carefully. Blood  pressure medicines must be taken as prescribed. Do not skip doses of blood pressure medicine. Doing this puts you at risk for problems and can make the medicine less effective. Ask your health care provider about side effects or reactions to medicines that you should watch for. Contact a health care provider if you: Think you are having a reaction to a medicine you are taking. Have headaches that keep coming back (recurring). Feel dizzy. Have swelling in your ankles. Have trouble with your vision. Get help right away if you: Develop a severe headache or confusion. Have unusual weakness or numbness. Feel faint. Have severe pain in your chest or abdomen. Vomit repeatedly. Have trouble breathing. These symptoms may be an emergency. Get help right away. Call 911. Do not wait to see if the symptoms will go away. Do not drive yourself to the hospital. Summary Hypertension is when the force of blood pumping through your arteries is too strong. If this condition is not controlled, it may put you at risk for serious complications. Your personal target blood pressure may vary depending on your medical conditions, your age, and other factors. For most people, a normal blood pressure is less than 120/80. Hypertension is treated with lifestyle changes, medicines, or a combination of both. Lifestyle changes include losing weight, eating a healthy,   low-sodium diet, exercising more, and limiting alcohol. This information is not intended to replace advice given to you by your health care provider. Make sure you discuss any questions you have with your health care provider. Document Revised: 03/22/2021 Document Reviewed: 03/22/2021 Elsevier Patient Education  2023 [employer] Inc[employer]  

## 2022-05-09 ENCOUNTER — Encounter: Payer: Medicare HMO | Admitting: Licensed Clinical Social Worker

## 2022-06-21 ENCOUNTER — Telehealth: Payer: Self-pay | Admitting: Licensed Clinical Social Worker

## 2022-06-21 NOTE — Patient Outreach (Signed)
  Care Coordination   06/21/2022 Name: Vicki Martin MRN: [id number] DOB: 1950/09/22   Care Coordination Outreach Attempts:  An unsuccessful telephone outreach was attempted today to offer the patient information about available care coordination services as a benefit of their health plan. Patient canceled previous appointment. Left voice message to call if additional support is needed.  Follow Up Plan:  No further outreach attempts will be made at this time. We have been unable to maintain contact the patient for coordination services  Encounter Outcome:  No Answer   Care Coordination Interventions:  No, not indicated    Casimer Lanius, [place] [phone]

## 2022-06-21 NOTE — Patient Instructions (Signed)
   Care Coordination provides support specific to your health needs that extend beyond exceptional routine office care you already receive from your primary care doctor.    If you are eligible for standard Care Coordination, there is no cost to you.  The Care Coordination team is made up of the following team members: Registered Nurse Care Guide: disease management, health education, care coordination and complex case management Clinical Social Work: Complex Care Coordination including coordination of level of care needs, mental and behavioral health assessment and recommendations, and connection to long-term mental health support Clinical Pharmacist: medication management, assistance and disease management Community Resource Care Guides: Forensic psychologist Team: dedicated team of scheduling professionals to support patient and clinical team scheduling needs  Please call [phone] if you would like to schedule a phone appointment with one of the team members.   Casimer Lanius, [place] [phone]

## 2022-06-22 ENCOUNTER — Other Ambulatory Visit: Payer: Self-pay | Admitting: Internal Medicine

## 2022-06-22 DIAGNOSIS — R000 Tachycardia, unspecified: Secondary | ICD-10-CM

## 2022-06-22 DIAGNOSIS — F410 Panic disorder [episodic paroxysmal anxiety] without agoraphobia: Secondary | ICD-10-CM

## 2022-06-22 DIAGNOSIS — I10 Essential (primary) hypertension: Secondary | ICD-10-CM

## 2022-06-22 DIAGNOSIS — F332 Major depressive disorder, recurrent severe without psychotic features: Secondary | ICD-10-CM

## 2022-07-01 ENCOUNTER — Other Ambulatory Visit: Payer: Self-pay | Admitting: Internal Medicine

## 2022-07-01 DIAGNOSIS — E876 Hypokalemia: Secondary | ICD-10-CM

## 2022-07-01 DIAGNOSIS — I10 Essential (primary) hypertension: Secondary | ICD-10-CM

## 2022-09-22 ENCOUNTER — Other Ambulatory Visit: Payer: Self-pay | Admitting: Internal Medicine

## 2022-09-22 DIAGNOSIS — F332 Major depressive disorder, recurrent severe without psychotic features: Secondary | ICD-10-CM

## 2022-09-22 DIAGNOSIS — F410 Panic disorder [episodic paroxysmal anxiety] without agoraphobia: Secondary | ICD-10-CM

## 2022-09-22 DIAGNOSIS — E785 Hyperlipidemia, unspecified: Secondary | ICD-10-CM

## 2022-09-23 ENCOUNTER — Other Ambulatory Visit: Payer: Self-pay | Admitting: Internal Medicine

## 2022-09-23 DIAGNOSIS — R000 Tachycardia, unspecified: Secondary | ICD-10-CM

## 2022-09-23 DIAGNOSIS — I10 Essential (primary) hypertension: Secondary | ICD-10-CM

## 2022-09-28 ENCOUNTER — Other Ambulatory Visit: Payer: Self-pay | Admitting: Internal Medicine

## 2022-09-28 DIAGNOSIS — I10 Essential (primary) hypertension: Secondary | ICD-10-CM

## 2022-09-28 DIAGNOSIS — E876 Hypokalemia: Secondary | ICD-10-CM

## 2022-10-31 ENCOUNTER — Other Ambulatory Visit: Payer: Self-pay | Admitting: Internal Medicine

## 2022-10-31 DIAGNOSIS — F410 Panic disorder [episodic paroxysmal anxiety] without agoraphobia: Secondary | ICD-10-CM

## 2022-11-05 ENCOUNTER — Other Ambulatory Visit: Payer: Self-pay | Admitting: Internal Medicine

## 2022-11-05 DIAGNOSIS — F410 Panic disorder [episodic paroxysmal anxiety] without agoraphobia: Secondary | ICD-10-CM

## 2022-11-23 ENCOUNTER — Encounter: Payer: Self-pay | Admitting: Internal Medicine

## 2022-11-23 ENCOUNTER — Ambulatory Visit (INDEPENDENT_AMBULATORY_CARE_PROVIDER_SITE_OTHER): Payer: Medicare Other | Admitting: Internal Medicine

## 2022-11-23 VITALS — BP 142/78 | HR 64 | Temp 97.6°F | Resp 16 | Ht 61.0 in | Wt 148.0 lb

## 2022-11-23 DIAGNOSIS — E781 Pure hyperglyceridemia: Secondary | ICD-10-CM | POA: Diagnosis not present

## 2022-11-23 DIAGNOSIS — R7303 Prediabetes: Secondary | ICD-10-CM

## 2022-11-23 DIAGNOSIS — K2100 Gastro-esophageal reflux disease with esophagitis, without bleeding: Secondary | ICD-10-CM | POA: Diagnosis not present

## 2022-11-23 DIAGNOSIS — E785 Hyperlipidemia, unspecified: Secondary | ICD-10-CM | POA: Diagnosis not present

## 2022-11-23 DIAGNOSIS — Z0001 Encounter for general adult medical examination with abnormal findings: Secondary | ICD-10-CM

## 2022-11-23 DIAGNOSIS — R0609 Other forms of dyspnea: Secondary | ICD-10-CM | POA: Diagnosis not present

## 2022-11-23 DIAGNOSIS — I10 Essential (primary) hypertension: Secondary | ICD-10-CM

## 2022-11-23 LAB — CBC WITH DIFFERENTIAL/PLATELET
Basophils Absolute: 0.1 10*3/uL (ref 0.0–0.1)
Basophils Relative: 1.8 % (ref 0.0–3.0)
Eosinophils Absolute: 0.3 10*3/uL (ref 0.0–0.7)
Eosinophils Relative: 5.2 % — ABNORMAL HIGH (ref 0.0–5.0)
HCT: 42.9 % (ref 36.0–46.0)
Hemoglobin: 14 g/dL (ref 12.0–15.0)
Lymphocytes Relative: 25.5 % (ref 12.0–46.0)
Lymphs Abs: 1.5 10*3/uL (ref 0.7–4.0)
MCHC: 32.6 g/dL (ref 30.0–36.0)
MCV: 91.3 fl (ref 78.0–100.0)
Monocytes Absolute: 0.7 10*3/uL (ref 0.1–1.0)
Monocytes Relative: 11.5 % (ref 3.0–12.0)
Neutro Abs: 3.2 10*3/uL (ref 1.4–7.7)
Neutrophils Relative %: 56 % (ref 43.0–77.0)
Platelets: 287 10*3/uL (ref 150.0–400.0)
RBC: 4.7 Mil/uL (ref 3.87–5.11)
RDW: 13.3 % (ref 11.5–15.5)
WBC: 5.8 10*3/uL (ref 4.0–10.5)

## 2022-11-23 LAB — LIPID PANEL
Cholesterol: 109 mg/dL (ref 0–200)
HDL: 53.3 mg/dL (ref >39.00)
LDL Cholesterol: 35 mg/dL (ref 0–99)
NonHDL: 55.64
Total CHOL/HDL Ratio: 2
Triglycerides: 103 mg/dL (ref 0.0–149.0)
VLDL: 20.6 mg/dL (ref 0.0–40.0)

## 2022-11-23 LAB — BASIC METABOLIC PANEL
BUN: 10 mg/dL (ref 6–23)
CO2: 29 mEq/L (ref 19–32)
Calcium: 9.7 mg/dL (ref 8.4–10.5)
Chloride: 102 mEq/L (ref 96–112)
Creatinine, Ser: 0.91 mg/dL (ref 0.40–1.20)
GFR: 63.13 mL/min (ref >60.00)
Glucose, Bld: 108 mg/dL — ABNORMAL HIGH (ref 70–99)
Potassium: 5.1 mEq/L (ref 3.5–5.1)
Sodium: 136 mEq/L (ref 135–145)

## 2022-11-23 LAB — BRAIN NATRIURETIC PEPTIDE: Pro B Natriuretic peptide (BNP): 108 pg/mL — ABNORMAL HIGH (ref 0.0–100.0)

## 2022-11-23 LAB — HEMOGLOBIN A1C: Hgb A1c MFr Bld: 6.1 % (ref 4.6–6.5)

## 2022-11-23 LAB — TROPONIN I (HIGH SENSITIVITY): High Sens Troponin I: 8 ng/L (ref 2–17)

## 2022-11-23 LAB — TSH: TSH: 2.92 u[IU]/mL (ref 0.35–5.50)

## 2022-11-23 NOTE — Patient Instructions (Signed)

## 2022-11-23 NOTE — Progress Notes (Signed)
Subjective:  Patient ID: Vicki Martin, female    DOB: [birth year]  Age: 72 y.o. MRN: [id number]  CC: Annual Exam, Hyperlipidemia, and Hypertension   HPI Vicki Martin presents for a CPX and f/up ---  Discussed the use of AI scribe software for clinical note transcription with the patient, who gave verbal consent to proceed.  History of Present Illness   The patient with a history of hypertension and heart murmur presents with complaints of dizziness, fatigue, and dyspnea on exertion. She reports that her blood pressure has been well-controlled on her current medication regimen. However, she has been experiencing episodes of dizziness, particularly when changing positions, such as standing up after playing with her puppies, which results in room spinning.  She also reports a significant level of fatigue, stating that it doesn't take much to tire her out. Despite this, she has been trying to stay active, although her activity is limited due to becoming easily winded. She denies any chest pain or cold, clammy sweats.  The patient has been under the care of a cardiologist for her heart murmur. She does not believe there is anything new or different wrong with her heart. She denies any side effects from her medications and reports no new symptoms. She also mentions a desire to sleep all the time, indicating a possible increase in her fatigue levels.  In terms of preventative care, the patient reports getting mammograms when necessary and has a bone density test scheduled for the upcoming month. She denies any other significant symptoms or health concerns.       Outpatient Medications Prior to Visit  Medication Sig Dispense Refill   atorvastatin (LIPITOR) 40 MG tablet Take 1 tablet by mouth once daily 90 tablet 0   carvedilol (COREG) 12.5 MG tablet TAKE 1 TABLET BY MOUTH TWICE DAILY WITH A MEAL 180 tablet 0   clonazePAM (KLONOPIN) 1 MG tablet Take 1 tablet (1 mg total) by mouth 2 (two)  times daily as needed. 60 tablet 2   DULoxetine (CYMBALTA) 60 MG capsule Take 1 capsule by mouth once daily 90 capsule 0   KLOR-CON M20 20 MEQ tablet Take 1 tablet by mouth twice daily 180 tablet 0   No facility-administered medications prior to visit.    ROS Review of Systems  Constitutional:  Positive for fatigue and unexpected weight change (wt gain). Negative for appetite change, diaphoresis and fever.  HENT: Negative.    Eyes: Negative.   Respiratory:  Positive for shortness of breath (DOE). Negative for cough, chest tightness, wheezing and stridor.   Cardiovascular:  Negative for chest pain, palpitations and leg swelling.  Gastrointestinal:  Negative for abdominal pain, constipation, diarrhea, nausea and vomiting.  Genitourinary: Negative.   Musculoskeletal: Negative.  Negative for arthralgias and myalgias.  Skin: Negative.   Neurological:  Positive for dizziness. Negative for weakness.  Hematological:  Negative for adenopathy. Does not bruise/bleed easily.  Psychiatric/Behavioral: Negative.      Objective:  BP (!) 142/78 (BP Location: Left Arm, Patient Position: Sitting, Cuff Size: Large)   Pulse 64   Temp 97.6 F (36.4 C) (Oral)   Resp 16   Ht 5\' 1"  (1.549 m)   Wt 148 lb (67.1 kg)   SpO2 92%   BMI 27.96 kg/m   BP Readings from Last 3 Encounters:  11/23/22 (!) 142/78  04/27/22 128/66  03/30/22 (!) 144/94    Wt Readings from Last 3 Encounters:  11/23/22 148 lb (67.1 kg)  04/27/22 149 lb (  67.6 kg)  03/30/22 146 lb (66.2 kg)    Physical Exam Vitals reviewed.  Constitutional:      Appearance: Normal appearance.  HENT:     Nose: Nose normal.     Mouth/Throat:     Mouth: Mucous membranes are moist.  Eyes:     General: No scleral icterus.    Conjunctiva/sclera: Conjunctivae normal.  Cardiovascular:     Rate and Rhythm: Regular rhythm. Bradycardia present.     Heart sounds: Normal heart sounds, S1 normal and S2 normal. No murmur heard.    No gallop.      Comments: EKG- SB, 55 bpm LBBB - old No acute ST/T wave changes Pulmonary:     Effort: Pulmonary effort is normal.     Breath sounds: No stridor. No wheezing, rhonchi or rales.  Abdominal:     General: Abdomen is flat.     Palpations: There is no mass.     Tenderness: There is no abdominal tenderness. There is no guarding.     Hernia: No hernia is present.  Musculoskeletal:     Cervical back: Neck supple.     Right lower leg: No edema.     Left lower leg: No edema.  Lymphadenopathy:     Cervical: No cervical adenopathy.  Skin:    General: Skin is warm and dry.     Coloration: Skin is not pale.  Neurological:     General: No focal deficit present.     Mental Status: She is alert. Mental status is at baseline.  Psychiatric:        Mood and Affect: Mood normal.        Behavior: Behavior normal.     Lab Results  Component Value Date   WBC 5.8 11/23/2022   HGB 14.0 11/23/2022   HCT 42.9 11/23/2022   PLT 287.0 11/23/2022   GLUCOSE 108 (H) 11/23/2022   CHOL 109 11/23/2022   TRIG 103.0 11/23/2022   HDL 53.30 11/23/2022   LDLDIRECT 40.0 10/24/2018   LDLCALC 35 11/23/2022   ALT 22 03/30/2022   AST 25 03/30/2022   NA 136 11/23/2022   K 5.1 11/23/2022   CL 102 11/23/2022   CREATININE 0.91 11/23/2022   BUN 10 11/23/2022   CO2 29 11/23/2022   TSH 2.92 11/23/2022   HGBA1C 6.1 11/23/2022    CT Angio Chest W/Cm &/Or Wo Cm  Result Date: 03/31/2022 CLINICAL DATA:  Chest pain, tachycardia, shortness of breath EXAM: CT ANGIOGRAPHY CHEST WITH CONTRAST TECHNIQUE: Multidetector CT imaging of the chest was performed using the standard protocol during bolus administration of intravenous contrast. Multiplanar CT image reconstructions and MIPs were obtained to evaluate the vascular anatomy. RADIATION DOSE REDUCTION: This exam was performed according to the departmental dose-optimization program which includes automated exposure control, adjustment of the mA and/or kV according to patient  size and/or use of iterative reconstruction technique. CONTRAST:  75mL ISOVUE-370 IOPAMIDOL (ISOVUE-370) INJECTION 76% COMPARISON:  CT chest done on 11/25/2019 FINDINGS: Cardiovascular: There is homogeneous enhancement in thoracic aorta. There are no intraluminal filling defects in pulmonary artery branches. Mediastinum/Nodes: No significant lymphadenopathy is seen. Thyroid is smaller than usual in size. Lungs/Pleura: There is no focal pulmonary consolidation. There is calcified granuloma in right upper lobe. In image 97 of series 7, there is 4 mm nodular density in right lower lobe which appears stable. No follow-up imaging is recommended. There is no pleural effusion or pneumothorax. Upper Abdomen: Surgical clips are seen in gallbladder fossa. There is  reflux of contrast into inferior vena cava and hepatic veins suggesting possible tricuspid incompetence. Musculoskeletal: No acute findings are seen. Review of the MIP images confirms the above findings. IMPRESSION: There is no evidence of pulmonary artery embolism. There is no evidence of thoracic aortic dissection. There is no focal pulmonary consolidation. Other findings as described in the body of the report. Electronically Signed   By: Ernie Avena M.D.   On: 03/31/2022 16:20    Assessment & Plan:   Essential hypertension- Her blood pressure is not at the goal of 130/80.  I recommended that she improve her lifestyle modifications. -     Basic metabolic panel; Future -     CBC with Differential/Platelet; Future -     TSH; Future -     EKG 12-Lead  Gastroesophageal reflux disease with esophagitis without hemorrhage -     CBC with Differential/Platelet; Future  Hyperlipidemia LDL goal <130 - LDL goal achieved. Doing well on the statin  -     Lipid panel; Future -     Lipoprotein A (LPA); Future -     TSH; Future  Hypertriglyceridemia  Prediabetes -     Lipid panel; Future -     Hemoglobin A1c; Future  Encounter for general adult  medical examination with abnormal findings- Exam completed, labs reviewed, vaccines reviewed and updated, cancer screenings are up-to-date, patient education was given.  DOE (dyspnea on exertion)- EKG and labs are reassuring.  I think this is due to poor conditioning.  I encouraged her to improve her lifestyle modifications. -     Brain natriuretic peptide; Future -     Troponin I (High Sensitivity); Future     Follow-up: Return in about 6 months (around 05/25/2023).  Sanda Linger, MD

## 2022-11-25 LAB — LIPOPROTEIN A (LPA): Lipoprotein (a): <10 nmol/L (ref <75)

## 2022-12-25 ENCOUNTER — Other Ambulatory Visit: Payer: Self-pay | Admitting: Internal Medicine

## 2022-12-25 DIAGNOSIS — E785 Hyperlipidemia, unspecified: Secondary | ICD-10-CM

## 2022-12-25 DIAGNOSIS — F332 Major depressive disorder, recurrent severe without psychotic features: Secondary | ICD-10-CM

## 2022-12-25 DIAGNOSIS — F410 Panic disorder [episodic paroxysmal anxiety] without agoraphobia: Secondary | ICD-10-CM

## 2022-12-25 DIAGNOSIS — R000 Tachycardia, unspecified: Secondary | ICD-10-CM

## 2022-12-25 DIAGNOSIS — I10 Essential (primary) hypertension: Secondary | ICD-10-CM

## 2023-01-30 ENCOUNTER — Inpatient Hospital Stay: Admission: RE | Admit: 2023-01-30 | Payer: HMO | Source: Ambulatory Visit

## 2023-01-31 ENCOUNTER — Other Ambulatory Visit: Payer: Self-pay | Admitting: Internal Medicine

## 2023-01-31 DIAGNOSIS — E2839 Other primary ovarian failure: Secondary | ICD-10-CM

## 2023-02-01 ENCOUNTER — Ambulatory Visit (INDEPENDENT_AMBULATORY_CARE_PROVIDER_SITE_OTHER)
Admission: RE | Admit: 2023-02-01 | Discharge: 2023-02-01 | Disposition: A | Discharge status: Home / Self Care | Payer: HMO | Source: Ambulatory Visit | Attending: Internal Medicine | Admitting: Internal Medicine

## 2023-02-01 DIAGNOSIS — Z13820 Encounter for screening for osteoporosis: Secondary | ICD-10-CM | POA: Diagnosis not present

## 2023-03-01 DIAGNOSIS — Z1231 Encounter for screening mammogram for malignant neoplasm of breast: Secondary | ICD-10-CM | POA: Diagnosis not present

## 2023-03-01 LAB — HM MAMMOGRAPHY

## 2023-03-02 ENCOUNTER — Encounter: Payer: Self-pay | Admitting: Internal Medicine

## 2023-03-09 ENCOUNTER — Ambulatory Visit: Payer: HMO

## 2023-03-09 VITALS — Ht 61.0 in | Wt 148.0 lb

## 2023-03-09 DIAGNOSIS — Z0000 Encounter for general adult medical examination without abnormal findings: Secondary | ICD-10-CM

## 2023-03-09 NOTE — Progress Notes (Signed)
Subjective:   Vicki Martin is a 72 y.o. female who presents for Medicare Annual (Subsequent) preventive examination.  Visit Complete: Virtual I connected with  Vicki Martin on 03/09/23 by a audio enabled telemedicine application and verified that I am speaking with the correct person using two identifiers.  Patient Location: Home  Provider Location: Home Office  I discussed the limitations of evaluation and management by telemedicine. The patient expressed understanding and agreed to proceed.  Vital Signs: Because this visit was a virtual/telehealth visit, some criteria may be missing or patient reported. Any vitals not documented were not able to be obtained and vitals that have been documented are patient reported.  Because this visit was a virtual/telehealth visit, some criteria may be missing or patient reported. Any vitals not documented were not able to be obtained and vitals that have been documented are patient reported.    Cardiac Risk Factors include: advanced age (>66men, >69 women);hypertension;dyslipidemia     Objective:    Today's Vitals   03/09/23 1505 03/09/23 1518  Weight: 148 lb (67.1 kg)   Height: 5\' 1"  (1.549 m)   PainSc:  6    Body mass index is 27.96 kg/m.     03/09/2023    3:25 PM 03/27/2022    3:41 PM 03/24/2021    4:23 PM 09/12/2019    7:13 PM 01/08/2018    3:35 PM 01/01/2017    4:19 PM 08/27/2015    4:10 PM  Advanced Directives  Does Patient Have a Medical Advance Directive? Yes Yes Yes No Yes Yes Yes  Type of Special educational needs teacher of [place];Living will Living will;Healthcare Power of [employer] Power of [place];Living will Healthcare Power of [place];Living will Healthcare Power of Attorney  Does patient want to make changes to medical advance directive?   No - Patient declined      Copy of Healthcare Power of Attorney in Chart?  No - copy requested No - copy requested   No - copy requested   Would patient like  information on creating a medical advance directive?    No - Patient declined       Current Medications (verified) Outpatient Encounter Medications as of 03/09/2023  Medication Sig   atorvastatin (LIPITOR) 40 MG tablet Take 1 tablet by mouth once daily   carvedilol (COREG) 12.5 MG tablet TAKE 1 TABLET BY MOUTH TWICE DAILY WITH A MEAL   clonazePAM (KLONOPIN) 1 MG tablet Take 1 tablet (1 mg total) by mouth 2 (two) times daily as needed.   DULoxetine (CYMBALTA) 60 MG capsule Take 1 capsule by mouth once daily   KLOR-CON M20 20 MEQ tablet Take 1 tablet by mouth twice daily   No facility-administered encounter medications on file as of 03/09/2023.    Allergies (verified) Codeine and Penicillins   History: Past Medical History:  Diagnosis Date   Anxiety    Arthritis    Depression    Family history of pancreatic cancer    Family history of stomach cancer    GERD (gastroesophageal reflux disease)    Heart murmur    Hyperlipidemia    no medications   Hypertension    Migraines    Sleep apnea    pt declines CPAP   Past Surgical History:  Procedure Laterality Date   ABDOMINAL HYSTERECTOMY     reports complet but with ovaries retained   CHOLECYSTECTOMY     removed hip bone to replace vertebrae     Family  History  Problem Relation Age of Onset   Rheum arthritis Mother    Stomach cancer Mother 18   Pancreatic cancer Mother 43   Heart disease Father    Hypertension Sister    Cancer Sister        lung   Lung cancer Brother        deceased   Early death Brother    Liver cancer Brother    Hypertension Brother    COPD Brother    Ovarian cysts Daughter    Stomach cancer Maternal Aunt    Pancreatic cancer Maternal Aunt    Cancer Maternal Aunt        NOS   Cancer Maternal Uncle        NOS   Lung cancer Other    Diabetes Neg Hx    Alcohol abuse Neg Hx    Stroke Neg Hx    Colon cancer Neg Hx    Esophageal cancer Neg Hx    Rectal cancer Neg Hx    Social History    Socioeconomic History   Marital status: Widowed    Spouse name: Not on file   Number of children: 3   Years of education: Not on file   Highest education level: Not on file  Occupational History   Occupation: REtired  Tobacco Use   Smoking status: Never   Smokeless tobacco: Never  Vaping Use   Vaping status: Never Used  Substance and Sexual Activity   Alcohol use: No    Alcohol/week: 0.0 standard drinks of alcohol   Drug use: No   Sexual activity: Not Currently    Partners: Male  Other Topics Concern   Not on file  Social History Narrative   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      Spiritual Beliefs: Baptist      Lifestyle: no regular CV exercise; diet is ok      Social Determinants of Corporate investment banker Strain: Low Risk  (03/09/2023)   Overall Financial Resource Strain (CARDIA)    Difficulty of Paying Living Expenses: Not hard at all  Food Insecurity: No Food Insecurity (03/09/2023)   Hunger Vital Sign    Worried About Running Out of Food in the Last Year: Never true    Ran Out of Food in the Last Year: Never true  Transportation Needs: No Transportation Needs (03/09/2023)   PRAPARE - Administrator, Civil Service (Medical): No    Lack of Transportation (Non-Medical): No  Physical Activity: Inactive (03/09/2023)   Exercise Vital Sign    Days of Exercise per Week: 0 days    Minutes of Exercise per Session: 0 min  Stress: Stress Concern Present (03/09/2023)   [employer] of Occupational Health - Occupational Stress Questionnaire    Feeling of Stress : To some extent  Social Connections: Socially Isolated (03/09/2023)   Social Connection and Isolation Panel [NHANES]    Frequency of Communication with Friends and Family: More than three times a week    Frequency of Social Gatherings with Friends and Family: Three times a week    Attends Religious Services: Never     Active Member of Clubs or Organizations: No    Attends Banker Meetings: Never    Marital Status: Widowed    Tobacco Counseling Counseling given: Not Answered   Clinical Intake:  Pre-visit preparation completed: Yes  Pain : 0-10 (thinks she has Shingles) Pain Score: 6  Pain Type: Other (Comment) (pt thinks she has Shingles) Pain Location:  (Started on her chest) Pain Radiating Towards: to stomach and on to back Pain Onset: 1 to 4 weeks ago Pain Relieving Factors: Has put itch cream on places  Pain Relieving Factors: Has put itch cream on places  BMI - recorded: 27.96 Nutritional Status: BMI 25 -29 Overweight Diabetes: No  How often do you need to have someone help you when you read instructions, pamphlets, or other written materials from your doctor or pharmacy?: 1 - Never  Interpreter Needed?: No  Information entered by :: Tameria Patti, RMA   Activities of Daily Living    03/09/2023    3:05 PM 03/27/2022    3:46 PM  In your present state of health, do you have any difficulty performing the following activities:  Hearing? 1 1  Vision? 0 0  Difficulty concentrating or making decisions? 0 0  Walking or climbing stairs? 1 0  Comment lt hip and rt knee are bothing her/ pain in them x 1 week   Dressing or bathing? 0 0  Doing errands, shopping? 0 0  Preparing Food and eating ? N N  Using the Toilet? N N  In the past six months, have you accidently leaked urine? N N  Do you have problems with loss of bowel control? N N  Managing your Medications? N N  Managing your Finances? N N  Housekeeping or managing your Housekeeping? N N    Patient Care Team: Etta Grandchild, MD as PCP - General (Internal Medicine) Nahser, Deloris Ping, MD as PCP - Cardiology (Cardiology)  Indicate any recent Medical Services you may have received from other than [facility] providers in the past year (date may be approximate).     Assessment:   This is a routine wellness examination  for [place].  Hearing/Vision screen Hearing Screening - Comments:: Yes has trouble hearing Vision Screening - Comments:: Denies vision issues   Goals Addressed   None   Depression Screen    03/09/2023    3:31 PM 11/23/2022    2:06 PM 03/27/2022    3:44 PM 08/09/2021    3:44 PM 03/24/2021    4:22 PM 07/07/2020    3:12 PM 07/15/2019    3:20 PM  PHQ 2/9 Scores  PHQ - 2 Score 3 1 6 5  0 0 5  PHQ- 9 Score 11 1 18 17   0 13    Fall Risk    03/09/2023    3:26 PM 03/27/2022    3:42 PM 03/24/2021    4:25 PM 07/15/2019    3:20 PM 04/23/2019    9:28 AM  Fall Risk   Falls in the past year? 0 0 0 0 0  Comment     Emmi Telephone Survey: data to providers prior to load  Number falls in past yr: 0 0 0 0   Injury with Fall? 0 0 0 0   Risk for fall due to : No Fall Risks No Fall Risks No Fall Risks    Follow up Falls prevention discussed;Falls evaluation completed Falls prevention discussed Falls evaluation completed Falls evaluation completed     MEDICARE RISK AT HOME: Medicare Risk at Home Any stairs in or around the home?: No Home free of loose throw rugs in walkways, pet beds, electrical cords, etc?: Yes Adequate lighting in your home to reduce risk of falls?: Yes Life alert?: No Use  of a cane, walker or w/c?: No Grab bars in the bathroom?: No Shower chair or bench in shower?: No Elevated toilet seat or a handicapped toilet?: No  TIMED UP AND GO:  Was the test performed?  No    Cognitive Function:        03/09/2023    3:26 PM 03/27/2022    3:47 PM  6CIT Screen  What Year? 0 points 0 points  What month? 0 points 0 points  What time? 0 points 0 points  Count back from 20 0 points 0 points  Months in reverse 2 points 0 points  Repeat phrase 2 points 0 points  Total Score 4 points 0 points    Immunizations Immunization History  Administered Date(s) Administered   Fluad Quad(high Dose 65+) 05/14/2019, 04/06/2020, 03/24/2021, 03/30/2022   Influenza, High Dose Seasonal  PF 03/09/2016, 04/22/2018   Influenza,inj,Quad PF,6+ Mos 08/04/2015   PFIZER(Purple Top)SARS-COV-2 Vaccination 09/02/2019, 09/23/2019   Pneumococcal Conjugate-13 12/11/2016   Pneumococcal Polysaccharide-23 01/08/2018   Td 07/27/2001   Tdap 08/04/2015   Zoster Recombinant(Shingrix) 01/24/2021    TDAP status: Up to date  Flu Vaccine status: Due, Education has been provided regarding the importance of this vaccine. Advised may receive this vaccine at local pharmacy or Health Dept. Aware to provide a copy of the vaccination record if obtained from local pharmacy or Health Dept. Verbalized acceptance and understanding.  Pneumococcal vaccine status: Up to date  Covid-19 vaccine status: Declined, Education has been provided regarding the importance of this vaccine but patient still declined. Advised may receive this vaccine at local pharmacy or Health Dept.or vaccine clinic. Aware to provide a copy of the vaccination record if obtained from local pharmacy or Health Dept. Verbalized acceptance and understanding.  Qualifies for Shingles Vaccine? Yes   Zostavax completed Yes   Shingrix Completed?: No.    Education has been provided regarding the importance of this vaccine. Patient has been advised to call insurance company to determine out of pocket expense if they have not yet received this vaccine. Advised may also receive vaccine at local pharmacy or Health Dept. Verbalized acceptance and understanding.  Screening Tests Health Maintenance  Topic Date Due   COVID-19 Vaccine (3 - 2023-24 season) 01/28/2023   Zoster Vaccines- Shingrix (2 of 2) 05/29/2023 (Originally 03/21/2021)   INFLUENZA VACCINE  08/27/2023 (Originally 12/28/2022)   Medicare Annual Wellness (AWV)  03/08/2024   MAMMOGRAM  02/28/2025   DTaP/Tdap/Td (3 - Td or Tdap) 08/03/2025   Colonoscopy  09/19/2025   Pneumonia Vaccine 60+ Years old  Completed   DEXA SCAN  Completed   Hepatitis C Screening  Completed   HPV VACCINES  Aged Out     Health Maintenance  Health Maintenance Due  Topic Date Due   COVID-19 Vaccine (3 - 2023-24 season) 01/28/2023    Colorectal cancer screening: Type of screening: Colonoscopy. Completed 09/20/2015. Repeat every 10 years  Mammogram status: Completed 03/01/2023. Repeat every year  Bone Density status: Completed 02/01/2023. Results reflect: Bone density results: OSTEOPENIA. Repeat every 2 years.  Lung Cancer Screening: (Low Dose CT Chest recommended if Age 53-80 years, 20 pack-year currently smoking OR have quit w/in 15years.) does not qualify.   Lung Cancer Screening Referral: N/A  Additional Screening:  Hepatitis C Screening: does qualify; Completed 10/24/2018  Vision Screening: Recommended annual ophthalmology exams for early detection of glaucoma and other disorders of the eye. Is the patient up to date with their annual eye exam?  No  Who is the  provider or what is the name of the office in which the patient attends annual eye exams? N/A If pt is not established with a provider, would they like to be referred to a provider to establish care? Yes .   Dental Screening: Recommended annual dental exams for proper oral hygiene    Community Resource Referral / Chronic Care Management: CRR required this visit?  No   CCM required this visit?  No     Plan:     I have personally reviewed and noted the following in the patient's chart:   Medical and social history Use of alcohol, tobacco or illicit drugs  Current medications and supplements including opioid prescriptions. Patient is not currently taking opioid prescriptions. Functional ability and status Nutritional status Physical activity Advanced directives List of other physicians Hospitalizations, surgeries, and ER visits in previous 12 months Vitals Screenings to include cognitive, depression, and falls Referrals and appointments  In addition, I have reviewed and discussed with patient certain preventive  protocols, quality metrics, and best practice recommendations. A written personalized care plan for preventive services as well as general preventive health recommendations were provided to patient.     Loula Marcella L Paulyne Mooty, CMA   03/09/2023   After Visit Summary: (MyChart) Due to this being a telephonic visit, the after visit summary with patients personalized plan was offered to patient via MyChart   Nurse Notes: Patient is due for a Flu vaccine and a 2nd Shingrix vaccine.  Patient is also due for a yearly eye exam and would like a recommendation from her PCP during her next office visit.  She had no other concerns to address today.

## 2023-03-09 NOTE — Patient Instructions (Signed)
Ms. Mago , Thank you for taking time to come for your Medicare Wellness Visit. I appreciate your ongoing commitment to your health goals. Please review the following plan we discussed and let me know if I can assist you in the future.   Referrals/Orders/Follow-Ups/Clinician Recommendations: You are due for a Flu vaccine and a 2nd Shingles vaccine.  Remember to inquire about an eye doctor recommendation from Dr. Yetta Barre during your next visit.  It was nice talking with you and keep up the good work.  This is a list of the screening recommended for you and due dates:  Health Maintenance  Topic Date Due   COVID-19 Vaccine (3 - 2023-24 season) 01/28/2023   Zoster (Shingles) Vaccine (2 of 2) 05/29/2023*   Flu Shot  08/27/2023*   Medicare Annual Wellness Visit  03/08/2024   Mammogram  02/28/2025   DTaP/Tdap/Td vaccine (3 - Td or Tdap) 08/03/2025   Colon Cancer Screening  09/19/2025   Pneumonia Vaccine  Completed   DEXA scan (bone density measurement)  Completed   Hepatitis C Screening  Completed   HPV Vaccine  Aged Out  *Topic was postponed. The date shown is not the original due date.    Advanced directives: (Copy Requested) Please bring a copy of your health care power of attorney and living will to the office to be added to your chart at your convenience.  Next Medicare Annual Wellness Visit scheduled for next year: Yes

## 2023-03-26 ENCOUNTER — Other Ambulatory Visit: Payer: Self-pay | Admitting: Internal Medicine

## 2023-03-26 DIAGNOSIS — I10 Essential (primary) hypertension: Secondary | ICD-10-CM

## 2023-03-26 DIAGNOSIS — E785 Hyperlipidemia, unspecified: Secondary | ICD-10-CM

## 2023-03-26 DIAGNOSIS — F410 Panic disorder [episodic paroxysmal anxiety] without agoraphobia: Secondary | ICD-10-CM

## 2023-03-26 DIAGNOSIS — F332 Major depressive disorder, recurrent severe without psychotic features: Secondary | ICD-10-CM

## 2023-03-26 DIAGNOSIS — R000 Tachycardia, unspecified: Secondary | ICD-10-CM

## 2023-04-10 ENCOUNTER — Ambulatory Visit: Payer: HMO | Admitting: Internal Medicine

## 2023-06-04 ENCOUNTER — Ambulatory Visit: Payer: HMO | Admitting: Internal Medicine

## 2023-06-04 ENCOUNTER — Encounter: Payer: Self-pay | Admitting: Internal Medicine

## 2023-06-04 VITALS — BP 160/88 | HR 65 | Temp 97.6°F | Resp 16 | Ht 61.0 in | Wt 145.0 lb

## 2023-06-04 DIAGNOSIS — I10 Essential (primary) hypertension: Secondary | ICD-10-CM | POA: Diagnosis not present

## 2023-06-04 DIAGNOSIS — Z23 Encounter for immunization: Secondary | ICD-10-CM

## 2023-06-04 DIAGNOSIS — M25551 Pain in right hip: Secondary | ICD-10-CM | POA: Diagnosis not present

## 2023-06-04 DIAGNOSIS — R0609 Other forms of dyspnea: Secondary | ICD-10-CM | POA: Diagnosis not present

## 2023-06-04 DIAGNOSIS — M545 Low back pain, unspecified: Secondary | ICD-10-CM

## 2023-06-04 DIAGNOSIS — M79604 Pain in right leg: Secondary | ICD-10-CM | POA: Insufficient documentation

## 2023-06-04 DIAGNOSIS — I5189 Other ill-defined heart diseases: Secondary | ICD-10-CM

## 2023-06-04 DIAGNOSIS — M79605 Pain in left leg: Secondary | ICD-10-CM

## 2023-06-04 DIAGNOSIS — R9431 Abnormal electrocardiogram [ECG] [EKG]: Secondary | ICD-10-CM | POA: Diagnosis not present

## 2023-06-04 DIAGNOSIS — M25552 Pain in left hip: Secondary | ICD-10-CM

## 2023-06-04 DIAGNOSIS — E785 Hyperlipidemia, unspecified: Secondary | ICD-10-CM

## 2023-06-04 DIAGNOSIS — R7303 Prediabetes: Secondary | ICD-10-CM | POA: Diagnosis not present

## 2023-06-04 NOTE — Patient Instructions (Signed)

## 2023-06-04 NOTE — Progress Notes (Signed)
 Subjective:  Patient ID: Vicki Martin, female    DOB: Feb 28, 1951  Age: 73 y.o. MRN: [id number]  CC: Hypertension and Back Pain   HPI [name] presents for f/up ----  Discussed the use of AI scribe software for clinical note transcription with the patient, who gave verbal consent to proceed.  History of Present Illness   The patient presents with bilateral hip pain, which is more severe on the left side. The pain radiates from the left hip into the left leg, and into the foot. She also reports pain in both hips, with the left side being more affected. The hip pain sometimes feels as if it is in the back. The patient has been managing the pain with two to three Aleve daily and a sleeping pill at night for rest. She denies any recent injury to the back or legs.  The patient also reports shortness of breath and dizziness, which have been ongoing for a little over a month. She notes that she becomes winded with minimal exertion, such as walking to the refrigerator or letting her dogs out. She denies any chest pain.  The patient has been less active due to the leg pain. She also reports episodes of sudden heating up and profuse sweating. She has been taking Aleve for pain management and a sleeping pill at night. She has not had a flu shot yet.  The patient also reports difficulty sleeping on her side due to hip pain and has been advised to sleep on her back with something under her legs. She has noticed a pulling sensation in her hips and legs during certain movements. She also reports that her blood pressure has been high.       Outpatient Medications Prior to Visit  Medication Sig Dispense Refill   atorvastatin  (LIPITOR) 40 MG tablet Take 1 tablet by mouth once daily 90 tablet 0   carvedilol  (COREG ) 12.5 MG tablet TAKE 1 TABLET BY MOUTH TWICE DAILY WITH A MEAL 180 tablet 0   clonazePAM  (KLONOPIN ) 1 MG tablet Take 1 tablet (1 mg total) by mouth 2 (two) times daily as needed. 60  tablet 2   DULoxetine  (CYMBALTA ) 60 MG capsule Take 1 capsule by mouth once daily 90 capsule 0   KLOR-CON  M20 20 MEQ tablet Take 1 tablet by mouth twice daily 180 tablet 0   No facility-administered medications prior to visit.    ROS Review of Systems  Objective:  BP (!) 160/88 (BP Location: Left Arm, Patient Position: Sitting, Cuff Size: Normal)   Pulse 65   Temp 97.6 F (36.4 C)   Resp 16   Ht 5' 1 (1.549 m)   Wt 145 lb (65.8 kg)   SpO2 95%   BMI 27.40 kg/m   BP Readings from Last 3 Encounters:  06/04/23 (!) 160/88  11/23/22 (!) 142/78  04/27/22 128/66    Wt Readings from Last 3 Encounters:  06/04/23 145 lb (65.8 kg)  03/09/23 148 lb (67.1 kg)  11/23/22 148 lb (67.1 kg)    Physical Exam Vitals reviewed.  Constitutional:      Appearance: Normal appearance.  HENT:     Nose: Nose normal.     Mouth/Throat:     Mouth: Mucous membranes are moist.  Eyes:     General: No scleral icterus.    Conjunctiva/sclera: Conjunctivae normal.  Cardiovascular:     Rate and Rhythm: Regular rhythm. Bradycardia present.     Heart sounds: No murmur heard.  No friction rub. No gallop.     Comments: EKG- SB, 59 bpm LAD, LBBB - unchanged Artifact LVH  Pulmonary:     Effort: Pulmonary effort is normal.     Breath sounds: No stridor. No wheezing, rhonchi or rales.  Abdominal:     General: Abdomen is flat.     Palpations: There is no mass.     Tenderness: There is no abdominal tenderness. There is no guarding.     Hernia: No hernia is present.  Musculoskeletal:        General: Normal range of motion.     Cervical back: Neck supple.     Right lower leg: No edema.     Left lower leg: No edema.  Lymphadenopathy:     Cervical: No cervical adenopathy.  Skin:    General: Skin is warm and dry.  Neurological:     General: No focal deficit present.     Mental Status: She is alert. Mental status is at baseline.  Psychiatric:        Mood and Affect: Mood normal.         Behavior: Behavior normal.     Lab Results  Component Value Date   WBC 6.0 06/05/2023   HGB 13.1 06/05/2023   HCT 38.7 06/05/2023   PLT 260.0 06/05/2023   GLUCOSE 101 (H) 06/05/2023   CHOL 109 11/23/2022   TRIG 103.0 11/23/2022   HDL 53.30 11/23/2022   LDLDIRECT 40.0 10/24/2018   LDLCALC 35 11/23/2022   ALT 27 06/05/2023   AST 27 06/05/2023   NA 137 06/05/2023   K 3.9 06/05/2023   CL 100 06/05/2023   CREATININE 0.81 06/05/2023   BUN 12 06/05/2023   CO2 29 06/05/2023   TSH 2.92 11/23/2022   HGBA1C 6.3 06/05/2023    DG Bone Density Result Date: 02/01/2023 Table formatting from the original result was not included. Date of study: 02/01/2023 Exam: DUAL X-RAY ABSORPTIOMETRY (DXA) FOR BONE MINERAL DENSITY (BMD) Instrument: [employer] Requesting Provider: PCP Indication: Screening for osteoporosis Comparison: 12/12/2016 Clinical data: Pt is a 73 y.o. female without previous history of fracture. Results:  Lumbar spine L1-L4 Femoral neck (FN) 33% distal radius T-score +0.5 RFN: -1.4 LFN: -1.1 n/a Change in BMD from previous DXA test (%) +6%* -10.6%* n/a (*) statistically significant Assessment: the BMD is low according to the [facility] classification for osteoporosis (see below). Fracture risk: moderate FRAX score: 10 year major osteoporotic risk: 10.2%. 10 year hip fracture risk: 1.6%. The thresholds for treatment are 20% and 3%, respectively. Comments: the technical quality of the study is good. Evaluation for secondary causes should be considered if clinically indicated. Recommend optimizing calcium  (1200 mg/day) and vitamin D (800 IU/day) intake. Followup: Repeat BMD is appropriate after 1 to 2 years. WHO criteria for diagnosis of osteoporosis in postmenopausal women and in men 51 y/o or older: - normal: T-score -1.0 to + 1.0 - osteopenia/low bone density: T-score between -2.5 and -1.0 - osteoporosis: T-score below -2.5 - severe osteoporosis: T-score below -2.5 with history of fragility  fracture Note: although not part of the WHO classification, the presence of a fragility fracture, regardless of the T-score, should be considered diagnostic of osteoporosis, provided other causes for the fracture have been excluded. Treatment: The National Osteoporosis Foundation recommends that treatment be considered in postmenopausal women and men age 59 or older with: 1. Hip or vertebral (clinical or morphometric) fracture 2. T-score of - 2.5 or lower at the spine or hip  3. 10-year fracture probability by FRAX of at least 20% for a major osteoporotic fracture and 3% for a hip fracture Lela Fendt, MD [facility]   ECHOCARDIOGRAM COMPLETE Result Date: 06/06/2023    ECHOCARDIOGRAM REPORT   Patient Name:   RILIE GLANZ [facility] Date of Exam: 06/06/2023 Medical Rec #:  [id number]        Height:       61.0 in Accession #:    [id number]       Weight:       145.0 lb Date of Birth:  Dec 05, 1950        BSA:          1.647 m Patient Age:    72 years         BP:           160/88 mmHg Patient Gender: F                HR:           66 bpm. Exam Location:  [address] Procedure: 2D Echo, 3D Echo, Cardiac Doppler, Color Doppler and Strain Analysis Indications:    Abnormal ECG R94.31  History:        Patient has prior history of Echocardiogram examinations, most                 recent 04/12/2022. Signs/Symptoms:Dyspnea; Risk                 Factors:Dyslipidemia, Hypertension and Sleep Apnea.  Sonographer:    Lauraine Pilot RDCS Referring Phys: [id number] DEBBY LITTIE MOLT  Sonographer Comments: Global longitudinal strain was attempted. IMPRESSIONS  1. Left ventricular ejection fraction, by estimation, is 60 to 65%. Left ventricular ejection fraction by 3D volume is 64 %. The left ventricle has normal function. The left ventricle has no regional wall motion abnormalities. There is mild left ventricular hypertrophy. Left ventricular diastolic parameters are consistent with Grade I diastolic dysfunction (impaired relaxation).  2.  Right ventricular systolic function is normal. The right ventricular size is normal. There is normal pulmonary artery systolic pressure. The estimated right ventricular systolic pressure is 19.2 mmHg.  3. The mitral valve is normal in structure. Trivial mitral valve regurgitation.  4. The aortic valve is tricuspid. Aortic valve regurgitation is not visualized. No aortic stenosis is present.  5. The inferior vena cava is normal in size with greater than 50% respiratory variability, suggesting right atrial pressure of 3 mmHg. FINDINGS  Left Ventricle: Left ventricular ejection fraction, by estimation, is 60 to 65%. Left ventricular ejection fraction by 3D volume is 64 %. The left ventricle has normal function. The left ventricle has no regional wall motion abnormalities. The left ventricular internal cavity size was normal in size. There is mild left ventricular hypertrophy. Left ventricular diastolic parameters are consistent with Grade I diastolic dysfunction (impaired relaxation). Right Ventricle: The right ventricular size is normal. No increase in right ventricular wall thickness. Right ventricular systolic function is normal. There is normal pulmonary artery systolic pressure. The tricuspid regurgitant velocity is 2.01 m/s, and  with an assumed right atrial pressure of 3 mmHg, the estimated right ventricular systolic pressure is 19.2 mmHg. Left Atrium: Left atrial size was normal in size. Right Atrium: Right atrial size was normal in size. Pericardium: There is no evidence of pericardial effusion. Mitral Valve: The mitral valve is normal in structure. Trivial mitral valve regurgitation. Tricuspid Valve: The tricuspid valve is normal in structure. Tricuspid valve regurgitation is trivial. Aortic Valve:  The aortic valve is tricuspid. Aortic valve regurgitation is not visualized. No aortic stenosis is present. Pulmonic Valve: The pulmonic valve was grossly normal. Pulmonic valve regurgitation is trivial. Aorta: The  aortic root and ascending aorta are structurally normal, with no evidence of dilitation. Venous: The inferior vena cava is normal in size with greater than 50% respiratory variability, suggesting right atrial pressure of 3 mmHg. IAS/Shunts: The atrial septum is grossly normal.  LEFT VENTRICLE PLAX 2D LVIDd:         3.89 cm         Diastology LVIDs:         2.77 cm         LV e' medial:    5.33 cm/s LV PW:         0.95 cm         LV E/e' medial:  10.1 LV IVS:        1.03 cm         LV e' lateral:   10.40 cm/s LVOT diam:     1.90 cm         LV E/e' lateral: 5.2 LV SV:         67 LV SV Index:   40 LVOT Area:     2.84 cm        3D Volume EF                                LV 3D EF:    Left                                             ventricul                                             ar                                             ejection                                             fraction                                             by 3D                                             volume is                                             64 %.  3D Volume EF:                                3D EF:        64 %                                LV EDV:       92 ml                                LV ESV:       33 ml                                LV SV:        59 ml RIGHT VENTRICLE RV S prime:     10.80 cm/s TAPSE (M-mode): 1.9 cm LEFT ATRIUM             Index        RIGHT ATRIUM           Index LA diam:        3.50 cm 2.12 cm/m   RA Area:     11.90 cm LA Vol (A2C):   31.0 ml 18.82 ml/m  RA Volume:   27.60 ml  16.75 ml/m LA Vol (A4C):   36.4 ml 22.09 ml/m LA Biplane Vol: 33.9 ml 20.58 ml/m  AORTIC VALVE LVOT Vmax:   96.70 cm/s LVOT Vmean:  64.300 cm/s LVOT VTI:    0.235 m  AORTA Ao Root diam: 2.90 cm Ao Asc diam:  3.20 cm MV E velocity: 54.00 cm/s  TRICUSPID VALVE MV A velocity: 62.10 cm/s  TR Peak grad:   16.2 mmHg MV E/A ratio:  0.87        TR Vmax:        201.00 cm/s                              SHUNTS                            Systemic VTI:  0.24 m                            Systemic Diam: 1.90 cm Lonni Nanas MD Electronically signed by Lonni Nanas MD Signature Date/Time: 06/06/2023/11:58:44 AM    Final      DG HIPS BILAT WITH PELVIS MIN 5 VIEWS Result Date: 06/11/2023 CLINICAL DATA:  Right hip pain for 2-3 months.  No injury EXAM: DG HIP (WITH OR WITHOUT PELVIS) 5+V BILAT COMPARISON:  None Available. FINDINGS: No fracture or bone lesion. Mild narrowing of the superolateral right hip joint space. Minor marginal osteophytes from the base of the right femoral head. Left hip joint, SI joints and pubic symphysis are normally spaced and aligned. Soft tissues are unremarkable. IMPRESSION: 1. No fracture or acute finding. 2. Mild right hip joint degenerative changes. Electronically Signed   By: Alm Parkins M.D.   On: 06/11/2023 10:51   DG Lumbar Spine Complete Result Date: 06/11/2023 CLINICAL DATA:  Bilateral hip and low back pain for 2-3 months. No injury.  EXAM: LUMBAR SPINE - COMPLETE 4+ VIEW COMPARISON:  None Available. FINDINGS: No fracture or bone lesion. No spondylolisthesis. Straightened lumbar lordosis. Mild loss of disc height at L2-L3. Remaining lumbar disc spaces are relatively well preserved. There small anterior endplate osteophytes throughout the lumbar spine. Skeletal structures are demineralized. Soft tissues are unremarkable. IMPRESSION: 1. No fracture or acute finding. 2. Mild degenerative changes as detailed. Electronically Signed   By: Alm Parkins M.D.   On: 06/11/2023 10:50   ECHOCARDIOGRAM COMPLETE Result Date: 06/06/2023    ECHOCARDIOGRAM REPORT   Patient Name:   Vicki Martin [facility] Date of Exam: 06/06/2023 Medical Rec #:  [id number]        Height:       61.0 in Accession #:    [id number]       Weight:       145.0 lb Date of Birth:  12/26/1950        BSA:          1.647 m Patient Age:    72 years         BP:           160/88 mmHg Patient Gender: F                 HR:           66 bpm. Exam Location:  [address] Procedure: 2D Echo, 3D Echo, Cardiac Doppler, Color Doppler and Strain Analysis Indications:    Abnormal ECG R94.31  History:        Patient has prior history of Echocardiogram examinations, most                 recent 04/12/2022. Signs/Symptoms:Dyspnea; Risk                 Factors:Dyslipidemia, Hypertension and Sleep Apnea.  Sonographer:    Lauraine Pilot RDCS Referring Phys: [id number] DEBBY LITTIE MOLT  Sonographer Comments: Global longitudinal strain was attempted. IMPRESSIONS  1. Left ventricular ejection fraction, by estimation, is 60 to 65%. Left ventricular ejection fraction by 3D volume is 64 %. The left ventricle has normal function. The left ventricle has no regional wall motion abnormalities. There is mild left ventricular hypertrophy. Left ventricular diastolic parameters are consistent with Grade I diastolic dysfunction (impaired relaxation).  2. Right ventricular systolic function is normal. The right ventricular size is normal. There is normal pulmonary artery systolic pressure. The estimated right ventricular systolic pressure is 19.2 mmHg.  3. The mitral valve is normal in structure. Trivial mitral valve regurgitation.  4. The aortic valve is tricuspid. Aortic valve regurgitation is not visualized. No aortic stenosis is present.  5. The inferior vena cava is normal in size with greater than 50% respiratory variability, suggesting right atrial pressure of 3 mmHg. FINDINGS  Left Ventricle: Left ventricular ejection fraction, by estimation, is 60 to 65%. Left ventricular ejection fraction by 3D volume is 64 %. The left ventricle has normal function. The left ventricle has no regional wall motion abnormalities. The left ventricular internal cavity size was normal in size. There is mild left ventricular hypertrophy. Left ventricular diastolic parameters are consistent with Grade I diastolic dysfunction (impaired relaxation). Right Ventricle: The right  ventricular size is normal. No increase in right ventricular wall thickness. Right ventricular systolic function is normal. There is normal pulmonary artery systolic pressure. The tricuspid regurgitant velocity is 2.01 m/s, and  with an assumed right atrial pressure of 3 mmHg, the estimated right ventricular systolic pressure is 19.2  mmHg. Left Atrium: Left atrial size was normal in size. Right Atrium: Right atrial size was normal in size. Pericardium: There is no evidence of pericardial effusion. Mitral Valve: The mitral valve is normal in structure. Trivial mitral valve regurgitation. Tricuspid Valve: The tricuspid valve is normal in structure. Tricuspid valve regurgitation is trivial. Aortic Valve: The aortic valve is tricuspid. Aortic valve regurgitation is not visualized. No aortic stenosis is present. Pulmonic Valve: The pulmonic valve was grossly normal. Pulmonic valve regurgitation is trivial. Aorta: The aortic root and ascending aorta are structurally normal, with no evidence of dilitation. Venous: The inferior vena cava is normal in size with greater than 50% respiratory variability, suggesting right atrial pressure of 3 mmHg. IAS/Shunts: The atrial septum is grossly normal.  LEFT VENTRICLE PLAX 2D LVIDd:         3.89 cm         Diastology LVIDs:         2.77 cm         LV e' medial:    5.33 cm/s LV PW:         0.95 cm         LV E/e' medial:  10.1 LV IVS:        1.03 cm         LV e' lateral:   10.40 cm/s LVOT diam:     1.90 cm         LV E/e' lateral: 5.2 LV SV:         67 LV SV Index:   40 LVOT Area:     2.84 cm        3D Volume EF                                LV 3D EF:    Left                                             ventricul                                             ar                                             ejection                                             fraction                                             by 3D                                             volume is  64 %.                                 3D Volume EF:                                3D EF:        64 %                                LV EDV:       92 ml                                LV ESV:       33 ml                                LV SV:        59 ml RIGHT VENTRICLE RV S prime:     10.80 cm/s TAPSE (M-mode): 1.9 cm LEFT ATRIUM             Index        RIGHT ATRIUM           Index LA diam:        3.50 cm 2.12 cm/m   RA Area:     11.90 cm LA Vol (A2C):   31.0 ml 18.82 ml/m  RA Volume:   27.60 ml  16.75 ml/m LA Vol (A4C):   36.4 ml 22.09 ml/m LA Biplane Vol: 33.9 ml 20.58 ml/m  AORTIC VALVE LVOT Vmax:   96.70 cm/s LVOT Vmean:  64.300 cm/s LVOT VTI:    0.235 m  AORTA Ao Root diam: 2.90 cm Ao Asc diam:  3.20 cm MV E velocity: 54.00 cm/s  TRICUSPID VALVE MV A velocity: 62.10 cm/s  TR Peak grad:   16.2 mmHg MV E/A ratio:  0.87        TR Vmax:        201.00 cm/s                             SHUNTS                            Systemic VTI:  0.24 m                            Systemic Diam: 1.90 cm Lonni Nanas MD Electronically signed by Lonni Nanas MD Signature Date/Time: 06/06/2023/11:58:44 AM    Final       Assessment & Plan:   DOE (dyspnea on exertion)- The work-up is c/w HFpEF. -     EKG 12-Lead -     Troponin I (High Sensitivity); Future -     Brain natriuretic peptide; Future -     CBC with Differential/Platelet; Future -     ECHOCARDIOGRAM COMPLETE; Future  Low back pain radiating to both legs -     DG Lumbar Spine Complete; Future -     CK; Future -     traMADol  HCl ER; Take 1 tablet (100 mg total)  by mouth daily as needed for pain.  Dispense: 90 tablet; Refill: 0  Bilateral hip pain -     DG HIPS BILAT W OR W/O PELVIS MIN 5 VIEWS; Future -     CK; Future  Prediabetes -     Hemoglobin A1c; Future -     Basic metabolic panel; Future  Essential hypertension- She has not achieved her BP goal. Will start a loop diuretic. -     Basic metabolic  panel; Future -     CBC with Differential/Platelet; Future -     Torsemide ; Take 1 tablet (20 mg total) by mouth daily.  Dispense: 90 tablet; Refill: 0 -     AMB Referral VBCI Care Management  Hyperlipidemia LDL goal <130- LDL goal achieved. Doing well on the statin  -     Hepatic function panel; Future -     CK; Future  Diastolic dysfunction without heart failure -     ECHOCARDIOGRAM COMPLETE; Future -     Torsemide ; Take 1 tablet (20 mg total) by mouth daily.  Dispense: 90 tablet; Refill: 0 -     AMB Referral VBCI Care Management -     Empagliflozin ; Take 1 tablet (10 mg total) by mouth daily before breakfast.  Dispense: 90 tablet; Refill: 0  Need for immunization against influenza -     Flu Vaccine Trivalent High Dose (Fluad)  Abnormal electrocardiogram (ECG) (EKG) -     ECHOCARDIOGRAM COMPLETE; Future     Follow-up: Return in about 3 months (around 09/02/2023).  Debby Molt, MD

## 2023-06-05 ENCOUNTER — Ambulatory Visit (INDEPENDENT_AMBULATORY_CARE_PROVIDER_SITE_OTHER): Payer: HMO

## 2023-06-05 DIAGNOSIS — M79604 Pain in right leg: Secondary | ICD-10-CM | POA: Diagnosis not present

## 2023-06-05 DIAGNOSIS — M79605 Pain in left leg: Secondary | ICD-10-CM | POA: Diagnosis not present

## 2023-06-05 DIAGNOSIS — M47816 Spondylosis without myelopathy or radiculopathy, lumbar region: Secondary | ICD-10-CM | POA: Diagnosis not present

## 2023-06-05 DIAGNOSIS — M25552 Pain in left hip: Secondary | ICD-10-CM | POA: Diagnosis not present

## 2023-06-05 DIAGNOSIS — M1611 Unilateral primary osteoarthritis, right hip: Secondary | ICD-10-CM | POA: Diagnosis not present

## 2023-06-05 DIAGNOSIS — M25551 Pain in right hip: Secondary | ICD-10-CM

## 2023-06-05 DIAGNOSIS — M545 Low back pain, unspecified: Secondary | ICD-10-CM | POA: Diagnosis not present

## 2023-06-05 LAB — CBC WITH DIFFERENTIAL/PLATELET
Basophils Absolute: 0.1 10*3/uL (ref 0.0–0.1)
Basophils Relative: 1.2 % (ref 0.0–3.0)
Eosinophils Absolute: 0.4 10*3/uL (ref 0.0–0.7)
Eosinophils Relative: 6.1 % — ABNORMAL HIGH (ref 0.0–5.0)
HCT: 38.7 % (ref 36.0–46.0)
Hemoglobin: 13.1 g/dL (ref 12.0–15.0)
Lymphocytes Relative: 19.8 % (ref 12.0–46.0)
Lymphs Abs: 1.2 10*3/uL (ref 0.7–4.0)
MCHC: 33.8 g/dL (ref 30.0–36.0)
MCV: 90.7 fL (ref 78.0–100.0)
Monocytes Absolute: 0.6 10*3/uL (ref 0.1–1.0)
Monocytes Relative: 10.6 % (ref 3.0–12.0)
Neutro Abs: 3.8 10*3/uL (ref 1.4–7.7)
Neutrophils Relative %: 62.3 % (ref 43.0–77.0)
Platelets: 260 10*3/uL (ref 150.0–400.0)
RBC: 4.26 Mil/uL (ref 3.87–5.11)
RDW: 13.5 % (ref 11.5–15.5)
WBC: 6 10*3/uL (ref 4.0–10.5)

## 2023-06-05 LAB — HEPATIC FUNCTION PANEL
ALT: 27 U/L (ref 0–35)
AST: 27 U/L (ref 0–37)
Albumin: 4.2 g/dL (ref 3.5–5.2)
Alkaline Phosphatase: 106 U/L (ref 39–117)
Bilirubin, Direct: 0.2 mg/dL (ref 0.0–0.3)
Total Bilirubin: 0.8 mg/dL (ref 0.2–1.2)
Total Protein: 6.7 g/dL (ref 6.0–8.3)

## 2023-06-05 LAB — BASIC METABOLIC PANEL
BUN: 12 mg/dL (ref 6–23)
CO2: 29 meq/L (ref 19–32)
Calcium: 8.9 mg/dL (ref 8.4–10.5)
Chloride: 100 meq/L (ref 96–112)
Creatinine, Ser: 0.81 mg/dL (ref 0.40–1.20)
GFR: 72.32 mL/min (ref >60.00)
Glucose, Bld: 101 mg/dL — ABNORMAL HIGH (ref 70–99)
Potassium: 3.9 meq/L (ref 3.5–5.1)
Sodium: 137 meq/L (ref 135–145)

## 2023-06-05 LAB — TROPONIN I (HIGH SENSITIVITY): High Sens Troponin I: 8 ng/L (ref 2–17)

## 2023-06-05 LAB — BRAIN NATRIURETIC PEPTIDE: Pro B Natriuretic peptide (BNP): 163 pg/mL — ABNORMAL HIGH (ref 0.0–100.0)

## 2023-06-05 LAB — HEMOGLOBIN A1C: Hgb A1c MFr Bld: 6.3 % (ref 4.6–6.5)

## 2023-06-05 LAB — CK: Total CK: 99 U/L (ref 7–177)

## 2023-06-05 MED ORDER — TORSEMIDE 20 MG PO TABS: 20.0000 mg | ORAL_TABLET | Freq: Every day | ORAL | 0 refills | Status: DC

## 2023-06-05 MED ORDER — TRAMADOL HCL ER 100 MG PO TB24: 100.0000 mg | ORAL_TABLET | Freq: Every day | ORAL | 0 refills | Status: DC | PRN

## 2023-06-05 MED ORDER — EMPAGLIFLOZIN 10 MG PO TABS: 10.0000 mg | ORAL_TABLET | Freq: Every day | ORAL | 0 refills | Status: DC

## 2023-06-06 ENCOUNTER — Ambulatory Visit (HOSPITAL_COMMUNITY): Payer: HMO | Attending: Internal Medicine

## 2023-06-06 DIAGNOSIS — R0609 Other forms of dyspnea: Secondary | ICD-10-CM | POA: Diagnosis not present

## 2023-06-06 DIAGNOSIS — I5189 Other ill-defined heart diseases: Secondary | ICD-10-CM | POA: Insufficient documentation

## 2023-06-06 DIAGNOSIS — R9431 Abnormal electrocardiogram [ECG] [EKG]: Secondary | ICD-10-CM | POA: Diagnosis not present

## 2023-06-06 LAB — ECHOCARDIOGRAM COMPLETE: S' Lateral: 2.77 cm

## 2023-06-10 ENCOUNTER — Encounter: Payer: Self-pay | Admitting: Internal Medicine

## 2023-06-11 ENCOUNTER — Telehealth: Payer: Self-pay

## 2023-06-11 NOTE — Progress Notes (Signed)
 Care Guide Pharmacy Note  06/11/2023 Name: Vicki Martin MRN: [id number] DOB: 09/18/1950  Referred By: Joshua Debby CROME, MD Reason for referral: Care Coordination (Outreach to schedule with Pharm d )   Vicki Martin is a 73 y.o. year old female who is a primary care patient of Joshua Debby CROME, MD.  Vicki Martin was referred to the pharmacist for assistance related to: HTN  Successful contact was made with the patient to discuss pharmacy services including being ready for the pharmacist to call at least 5 minutes before the scheduled appointment time and to have medication bottles and any blood pressure readings ready for review. The patient agreed to meet with the pharmacist via telephone visit on (date/time).06/20/2023  Jeoffrey Buffalo , RMA     [place]  [facility], [facility] Guide  Direct Dial: [phone]  Website: [email or web address]

## 2023-06-20 ENCOUNTER — Other Ambulatory Visit: Payer: HMO

## 2023-06-27 ENCOUNTER — Telehealth: Payer: Self-pay

## 2023-06-27 ENCOUNTER — Other Ambulatory Visit (HOSPITAL_COMMUNITY): Payer: Self-pay

## 2023-06-27 NOTE — Telephone Encounter (Signed)
*  Primary  Pharmacy Patient Advocate Encounter   Received notification from Fax that prior authorization for traMADol HCl ER 100MG  er tablets  is required/requested.   Insurance verification completed.   The patient is insured through [facility] ADVANTAGE/RX ADVANCE .   Per test claim: PA required; PA submitted to above mentioned insurance via CoverMyMeds Key/confirmation #/EOC [facility] Status is pending

## 2023-06-28 ENCOUNTER — Other Ambulatory Visit: Payer: Self-pay | Admitting: Internal Medicine

## 2023-06-28 DIAGNOSIS — R000 Tachycardia, unspecified: Secondary | ICD-10-CM

## 2023-06-28 DIAGNOSIS — F332 Major depressive disorder, recurrent severe without psychotic features: Secondary | ICD-10-CM

## 2023-06-28 DIAGNOSIS — E785 Hyperlipidemia, unspecified: Secondary | ICD-10-CM

## 2023-06-28 DIAGNOSIS — F410 Panic disorder [episodic paroxysmal anxiety] without agoraphobia: Secondary | ICD-10-CM

## 2023-06-28 DIAGNOSIS — I10 Essential (primary) hypertension: Secondary | ICD-10-CM

## 2023-06-28 NOTE — Telephone Encounter (Signed)
Pharmacy Patient Advocate Encounter  Received notification from [facility] ADVANTAGE/RX ADVANCE that Prior Authorization for traMADol HCl ER 100MG  er tablets has been DENIED.  See denial reason below. No denial letter attached in CMM. Will attach denial letter to Media tab once received.   PA #/Case ID/Reference #: [id number]   *Non formulary exceptions rules have not been met. Plan requires a diagnosis of medically accepted indication AND tried and failed 1 formulary alternative OR The MD can submit supporting statement to explain why formulary drugs aren't appropriate for patient. The formulary alternatives are Morphine ER, Methadone or Xtampza.

## 2023-06-29 NOTE — Telephone Encounter (Signed)
 Patient has been made aware. Gave a verbal understanding.

## 2023-07-05 ENCOUNTER — Other Ambulatory Visit: Payer: HMO | Admitting: Pharmacist

## 2023-07-05 DIAGNOSIS — I5189 Other ill-defined heart diseases: Secondary | ICD-10-CM

## 2023-07-05 DIAGNOSIS — I10 Essential (primary) hypertension: Secondary | ICD-10-CM

## 2023-07-05 NOTE — Patient Instructions (Signed)
 It was a pleasure speaking with you today!  I recommend getting a home blood pressure arm monitor. Keep a log of blood readings. Try to check especially when you have dizziness or nausea.  I will call back in 3 weeks.  Feel free to call with any questions or concerns!  Darrelyn Drum, PharmD, BCPS, CPP Clinical Pharmacist Practitioner [facility] [phone]

## 2023-07-05 NOTE — Progress Notes (Signed)
 07/05/2023 Name: Vicki Martin MRN: [id number] DOB: [birth year]  Chief Complaint  Patient presents with   Hypertension   Medication Management    Vicki Martin is a 73 y.o. year old female who presented for a telephone visit.   They were referred to the pharmacist by their PCP for assistance in managing hypertension.    Subjective:  Care Team: Primary Care Provider: Joshua Debby CROME, MD ; Next Scheduled Visit: none scheduled  Medication Access/Adherence  Current Pharmacy:  [facility] [address] [place] - [address] [place] [address] Phone: [phone] Fax: [phone]   Patient reports affordability concerns with their medications: No  Patient reports access/transportation concerns to their pharmacy: No  Patient reports adherence concerns with their medications:  No     Hypertension/Diastolic dysfunction:  Current medications: carvedilol  12.5 mg twice daily, empagliflozin  25 mg daily, torsemide  20 mg daily   Patient does not have a validated, automated, upper arm home BP cuff Current blood pressure readings: 127/81 at [facility] right before phone call. She checks it when she goes to [employer] which she states is quite often  Patient reports hypotensive s/sx including nausea, dizziness, lightheadedness.  This started after she started torsemide  and empagliflozin  per patient. She notes dizziness and queasiness occurs daily.  Objective:  Lab Results  Component Value Date   HGBA1C 6.3 06/05/2023    Lab Results  Component Value Date   CREATININE 0.81 06/05/2023   BUN 12 06/05/2023   NA 137 06/05/2023   K 3.9 06/05/2023   CL 100 06/05/2023   CO2 29 06/05/2023    Lab Results  Component Value Date   CHOL 109 11/23/2022   HDL 53.30 11/23/2022   LDLCALC 35 11/23/2022   LDLDIRECT 40.0 10/24/2018   TRIG 103.0 11/23/2022   CHOLHDL 2 11/23/2022    Medications Reviewed Today     Reviewed by Merceda Lela SAUNDERS, RPH (Pharmacist) on 07/05/23 at 1527  Med List Status: <None>   Medication Order Taking? Sig Documenting Provider Last Dose Status Informant  atorvastatin  (LIPITOR) 40 MG tablet [id number] Yes Take 1 tablet by mouth once daily Joshua Debby CROME, MD Taking Active   carvedilol  (COREG ) 12.5 MG tablet [id number] Yes TAKE 1 TABLET BY MOUTH TWICE DAILY WITH A MEAL Joshua Debby CROME, MD Taking Active   clonazePAM  (KLONOPIN ) 1 MG tablet [id number] No Take 1 tablet (1 mg total) by mouth 2 (two) times daily as needed.  Patient not taking: Reported on 07/05/2023   Joshua Debby CROME, MD Not Taking Active   DULoxetine  (CYMBALTA ) 60 MG capsule [id number] Yes Take 1 capsule by mouth once daily Joshua Debby CROME, MD Taking Active   empagliflozin  (JARDIANCE ) 10 MG TABS tablet [id number] Yes Take 1 tablet (10 mg total) by mouth daily before breakfast. Joshua Debby CROME, MD Taking Active   KLOR-CON  M20 20 MEQ tablet [id number]  Take 1 tablet by mouth twice daily Joshua Debby CROME, MD  Active   torsemide  (DEMADEX ) 20 MG tablet [id number] Yes Take 1 tablet (20 mg total) by mouth daily. Joshua Debby CROME, MD Taking Active   traMADol  (ULTRAM -ER) 100 MG 24 hr tablet [id number] No Take 1 tablet (100 mg total) by mouth daily as needed for pain.  Patient not taking: Reported on 07/05/2023   Joshua Debby CROME, MD Not Taking Active               Assessment/Plan:   Hypertension: - Currently borderline controlled, BP goal <130/80 -  Recommended to purchase automatic arm monitor to check at home, especially around the time she experiences dizziness/nausea. Recommended to check home blood pressure and heart rate regularly. Keep a log.   Follow Up Plan: 3 weeks  Darrelyn Drum, PharmD, BCPS, CPP Clinical Pharmacist Practitioner [facility] [phone]

## 2023-07-26 ENCOUNTER — Other Ambulatory Visit: Payer: HMO

## 2023-08-30 ENCOUNTER — Other Ambulatory Visit: Payer: Self-pay | Admitting: Internal Medicine

## 2023-08-30 DIAGNOSIS — I5189 Other ill-defined heart diseases: Secondary | ICD-10-CM

## 2023-08-30 DIAGNOSIS — I10 Essential (primary) hypertension: Secondary | ICD-10-CM

## 2023-08-30 DIAGNOSIS — F332 Major depressive disorder, recurrent severe without psychotic features: Secondary | ICD-10-CM

## 2023-08-30 DIAGNOSIS — F410 Panic disorder [episodic paroxysmal anxiety] without agoraphobia: Secondary | ICD-10-CM

## 2023-10-01 ENCOUNTER — Other Ambulatory Visit: Payer: Self-pay | Admitting: Internal Medicine

## 2023-10-01 DIAGNOSIS — I10 Essential (primary) hypertension: Secondary | ICD-10-CM

## 2023-10-01 DIAGNOSIS — R000 Tachycardia, unspecified: Secondary | ICD-10-CM

## 2023-10-01 DIAGNOSIS — E785 Hyperlipidemia, unspecified: Secondary | ICD-10-CM

## 2023-10-09 ENCOUNTER — Other Ambulatory Visit: Payer: Self-pay | Admitting: Internal Medicine

## 2023-10-09 DIAGNOSIS — I10 Essential (primary) hypertension: Secondary | ICD-10-CM

## 2023-10-09 DIAGNOSIS — R000 Tachycardia, unspecified: Secondary | ICD-10-CM

## 2023-10-09 DIAGNOSIS — E785 Hyperlipidemia, unspecified: Secondary | ICD-10-CM

## 2023-10-09 MED ORDER — ATORVASTATIN CALCIUM 40 MG PO TABS: 40.0000 mg | ORAL_TABLET | Freq: Every day | ORAL | 0 refills | Status: DC

## 2023-10-09 MED ORDER — CARVEDILOL 12.5 MG PO TABS: 12.5000 mg | ORAL_TABLET | Freq: Two times a day (BID) | ORAL | 0 refills | Status: DC

## 2023-11-29 ENCOUNTER — Other Ambulatory Visit: Payer: Self-pay | Admitting: Internal Medicine

## 2023-11-29 DIAGNOSIS — I5189 Other ill-defined heart diseases: Secondary | ICD-10-CM

## 2023-12-01 ENCOUNTER — Other Ambulatory Visit: Payer: Self-pay | Admitting: Internal Medicine

## 2023-12-01 DIAGNOSIS — I5189 Other ill-defined heart diseases: Secondary | ICD-10-CM

## 2023-12-01 DIAGNOSIS — I10 Essential (primary) hypertension: Secondary | ICD-10-CM

## 2023-12-26 ENCOUNTER — Ambulatory Visit (INDEPENDENT_AMBULATORY_CARE_PROVIDER_SITE_OTHER): Admitting: Internal Medicine

## 2023-12-26 ENCOUNTER — Encounter: Payer: Self-pay | Admitting: Internal Medicine

## 2023-12-26 VITALS — BP 136/80 | HR 67 | Temp 98.4°F | Resp 16 | Ht 61.0 in | Wt 144.6 lb

## 2023-12-26 DIAGNOSIS — R55 Syncope and collapse: Secondary | ICD-10-CM

## 2023-12-26 DIAGNOSIS — I5189 Other ill-defined heart diseases: Secondary | ICD-10-CM

## 2023-12-26 DIAGNOSIS — R7303 Prediabetes: Secondary | ICD-10-CM | POA: Diagnosis not present

## 2023-12-26 DIAGNOSIS — T502X5A Adverse effect of carbonic-anhydrase inhibitors, benzothiadiazides and other diuretics, initial encounter: Secondary | ICD-10-CM

## 2023-12-26 DIAGNOSIS — E781 Pure hyperglyceridemia: Secondary | ICD-10-CM | POA: Diagnosis not present

## 2023-12-26 DIAGNOSIS — K2100 Gastro-esophageal reflux disease with esophagitis, without bleeding: Secondary | ICD-10-CM | POA: Diagnosis not present

## 2023-12-26 DIAGNOSIS — R001 Bradycardia, unspecified: Secondary | ICD-10-CM | POA: Diagnosis not present

## 2023-12-26 DIAGNOSIS — R1319 Other dysphagia: Secondary | ICD-10-CM | POA: Diagnosis not present

## 2023-12-26 DIAGNOSIS — Z0001 Encounter for general adult medical examination with abnormal findings: Secondary | ICD-10-CM

## 2023-12-26 DIAGNOSIS — I10 Essential (primary) hypertension: Secondary | ICD-10-CM

## 2023-12-26 DIAGNOSIS — E785 Hyperlipidemia, unspecified: Secondary | ICD-10-CM | POA: Diagnosis not present

## 2023-12-26 DIAGNOSIS — E876 Hypokalemia: Secondary | ICD-10-CM

## 2023-12-26 DIAGNOSIS — Z0000 Encounter for general adult medical examination without abnormal findings: Secondary | ICD-10-CM | POA: Diagnosis not present

## 2023-12-26 LAB — LIPID PANEL
Cholesterol: 113 mg/dL (ref 0–200)
HDL: 52.7 mg/dL (ref >39.00)
LDL Cholesterol: 37 mg/dL (ref 0–99)
NonHDL: 59.86
Total CHOL/HDL Ratio: 2
Triglycerides: 116 mg/dL (ref 0.0–149.0)
VLDL: 23.2 mg/dL (ref 0.0–40.0)

## 2023-12-26 LAB — CBC WITH DIFFERENTIAL/PLATELET
Basophils Absolute: 0.1 K/uL (ref 0.0–0.1)
Basophils Relative: 1.8 % (ref 0.0–3.0)
Eosinophils Absolute: 0.3 K/uL (ref 0.0–0.7)
Eosinophils Relative: 4.2 % (ref 0.0–5.0)
HCT: 42.1 % (ref 36.0–46.0)
Hemoglobin: 14 g/dL (ref 12.0–15.0)
Lymphocytes Relative: 21.6 % (ref 12.0–46.0)
Lymphs Abs: 1.3 K/uL (ref 0.7–4.0)
MCHC: 33.3 g/dL (ref 30.0–36.0)
MCV: 90.9 fl (ref 78.0–100.0)
Monocytes Absolute: 0.6 K/uL (ref 0.1–1.0)
Monocytes Relative: 9.2 % (ref 3.0–12.0)
Neutro Abs: 3.9 K/uL (ref 1.4–7.7)
Neutrophils Relative %: 63.2 % (ref 43.0–77.0)
Platelets: 283 K/uL (ref 150.0–400.0)
RBC: 4.63 Mil/uL (ref 3.87–5.11)
RDW: 13.9 % (ref 11.5–15.5)
WBC: 6.2 K/uL (ref 4.0–10.5)

## 2023-12-26 LAB — BASIC METABOLIC PANEL WITH GFR
BUN: 14 mg/dL (ref 6–23)
CO2: 31 meq/L (ref 19–32)
Calcium: 9.3 mg/dL (ref 8.4–10.5)
Chloride: 100 meq/L (ref 96–112)
Creatinine, Ser: 1.18 mg/dL (ref 0.40–1.20)
GFR: 45.87 mL/min — ABNORMAL LOW (ref >60.00)
Glucose, Bld: 103 mg/dL — ABNORMAL HIGH (ref 70–99)
Potassium: 3.2 meq/L — ABNORMAL LOW (ref 3.5–5.1)
Sodium: 139 meq/L (ref 135–145)

## 2023-12-26 LAB — TSH: TSH: 3.63 u[IU]/mL (ref 0.35–5.50)

## 2023-12-26 MED ORDER — ESOMEPRAZOLE MAGNESIUM 40 MG PO CPDR: 40.0000 mg | DELAYED_RELEASE_CAPSULE | Freq: Every day | ORAL | 1 refills | Status: DC

## 2023-12-26 NOTE — Patient Instructions (Signed)

## 2023-12-26 NOTE — Progress Notes (Signed)
 Subjective:  Patient ID: Vicki Martin, female    DOB: [birth year]  Age: 73 y.o. MRN: [id number]  CC: Annual Exam, Hypertension, and Hyperlipidemia   HPI Vicki Martin presents for a CPX and f/up ----  Discussed the use of AI scribe software for clinical note transcription with the patient, who gave verbal consent to proceed.  History of Present Illness Vicki Martin is a 73 year old female with hypertension who presents with dizziness and falls.  She experiences dizziness and lightheadedness, which have led to two or three falls recently. One fall resulted in a nosebleed after she landed on concrete steps. She attributes one fall to tripping, but the others were due to dizziness. She has not been monitoring her blood pressure at home. No nausea, vomiting, diarrhea, or weight loss, but she reports a lack of appetite. She also experiences variable sleep patterns, either sleeping too much or not at all.  She has been experiencing dysphagia for about six months, describing it as food 'sticking' and feeling like she is choking when eating. She takes an over-the-counter heartburn medication, Equate, which she needs to double up on for it to be effective. No numbness, weakness, or tingling, and no issues with bowel movements.    Outpatient Medications Prior to Visit  Medication Sig Dispense Refill   atorvastatin  (LIPITOR) 40 MG tablet Take 1 tablet (40 mg total) by mouth daily. 90 tablet 0   carvedilol  (COREG ) 12.5 MG tablet Take 1 tablet (12.5 mg total) by mouth 2 (two) times daily with a meal. 180 tablet 0   DULoxetine  (CYMBALTA ) 60 MG capsule Take 1 capsule by mouth once daily 90 capsule 0   empagliflozin  (JARDIANCE ) 10 MG TABS tablet TAKE 1 TABLET BY MOUTH ONCE DAILY BEFORE BREAKFAST. Patient to follow up with PCP prior to future refills 30 tablet 0   torsemide  (DEMADEX ) 20 MG tablet Take 1 tablet (20 mg total) by mouth daily. Schedule an appt for further refills 30 tablet 0    clonazePAM  (KLONOPIN ) 1 MG tablet Take 1 tablet (1 mg total) by mouth 2 (two) times daily as needed. (Patient not taking: Reported on 07/05/2023) 60 tablet 2   KLOR-CON  M20 20 MEQ tablet Take 1 tablet by mouth twice daily 180 tablet 0   traMADol  (ULTRAM -ER) 100 MG 24 hr tablet Take 1 tablet (100 mg total) by mouth daily as needed for pain. (Patient not taking: Reported on 07/05/2023) 90 tablet 0   No facility-administered medications prior to visit.    ROS Review of Systems  Constitutional:  Negative for appetite change, chills, diaphoresis, fatigue and fever.  HENT:  Positive for trouble swallowing. Negative for facial swelling, sore throat and voice change.   Eyes:  Negative for visual disturbance.  Respiratory:  Negative for cough, chest tightness, shortness of breath and wheezing.   Cardiovascular:  Negative for chest pain, palpitations and leg swelling.  Gastrointestinal:  Negative for abdominal pain, constipation, diarrhea, nausea and vomiting.  Genitourinary:  Negative for difficulty urinating, dysuria and hematuria.  Musculoskeletal: Negative.  Negative for arthralgias and myalgias.  Skin: Negative.   Neurological:  Positive for dizziness, syncope and light-headedness. Negative for speech difficulty, weakness and numbness.  Hematological:  Negative for adenopathy. Does not bruise/bleed easily.  Psychiatric/Behavioral: Negative.      Objective:  BP 136/80 (BP Location: Left Arm, Patient Position: Sitting, Cuff Size: Normal)   Pulse 67   Temp 98.4 F (36.9 C) (Oral)   Resp 16   Ht  5' 1 (1.549 m)   Wt 144 lb 9.6 oz (65.6 kg)   SpO2 96%   BMI 27.32 kg/m   BP Readings from Last 3 Encounters:  12/26/23 136/80  06/04/23 (!) 160/88  11/23/22 (!) 142/78    Wt Readings from Last 3 Encounters:  12/26/23 144 lb 9.6 oz (65.6 kg)  06/04/23 145 lb (65.8 kg)  03/09/23 148 lb (67.1 kg)    Physical Exam Vitals reviewed.  Constitutional:      Appearance: Normal appearance.  HENT:      Mouth/Throat:     Mouth: Mucous membranes are moist.  Eyes:     General: No scleral icterus.    Conjunctiva/sclera: Conjunctivae normal.  Cardiovascular:     Rate and Rhythm: Normal rate and regular rhythm.     Heart sounds: No murmur heard.    No friction rub. No gallop.     Comments: EKG-- NSR, 63 bpm LAD LBBB Unchanged  Pulmonary:     Effort: Pulmonary effort is normal.     Breath sounds: No stridor. No wheezing, rhonchi or rales.  Abdominal:     General: Abdomen is flat.     Palpations: There is no mass.     Tenderness: There is no abdominal tenderness. There is no guarding.     Hernia: No hernia is present.  Musculoskeletal:        General: Normal range of motion.     Cervical back: Neck supple.     Right lower leg: No edema.     Left lower leg: No edema.  Lymphadenopathy:     Cervical: No cervical adenopathy.  Skin:    General: Skin is warm and dry.  Neurological:     General: No focal deficit present.     Mental Status: She is alert. Mental status is at baseline.  Psychiatric:        Mood and Affect: Mood normal.        Behavior: Behavior normal.     Lab Results  Component Value Date   WBC 6.2 12/26/2023   HGB 14.0 12/26/2023   HCT 42.1 12/26/2023   PLT 283.0 12/26/2023   GLUCOSE 103 (H) 12/26/2023   CHOL 113 12/26/2023   TRIG 116.0 12/26/2023   HDL 52.70 12/26/2023   LDLDIRECT 40.0 10/24/2018   LDLCALC 37 12/26/2023   ALT 27 06/05/2023   AST 27 06/05/2023   NA 139 12/26/2023   K 3.2 (L) 12/26/2023   CL 100 12/26/2023   CREATININE 1.18 12/26/2023   BUN 14 12/26/2023   CO2 31 12/26/2023   TSH 3.63 12/26/2023   HGBA1C 6.3 12/26/2023    DG Bone Density Result Date: 02/01/2023 Table formatting from the original result was not included. Date of study: 02/01/2023 Exam: DUAL X-RAY ABSORPTIOMETRY (DXA) FOR BONE MINERAL DENSITY (BMD) Instrument: [employer] Requesting Provider: PCP Indication: Screening for osteoporosis Comparison: 12/12/2016  Clinical data: Pt is a 73 y.o. female without previous history of fracture. Results:  Lumbar spine L1-L4 Femoral neck (FN) 33% distal radius T-score +0.5 RFN: -1.4 LFN: -1.1 n/a Change in BMD from previous DXA test (%) +6%* -10.6%* n/a (*) statistically significant Assessment: the BMD is low according to the [facility] classification for osteoporosis (see below). Fracture risk: moderate FRAX score: 10 year major osteoporotic risk: 10.2%. 10 year hip fracture risk: 1.6%. The thresholds for treatment are 20% and 3%, respectively. Comments: the technical quality of the study is good. Evaluation for secondary causes should be considered if clinically  indicated. Recommend optimizing calcium  (1200 mg/day) and vitamin D (800 IU/day) intake. Followup: Repeat BMD is appropriate after 1 to 2 years. WHO criteria for diagnosis of osteoporosis in postmenopausal women and in men 68 y/o or older: - normal: T-score -1.0 to + 1.0 - osteopenia/low bone density: T-score between -2.5 and -1.0 - osteoporosis: T-score below -2.5 - severe osteoporosis: T-score below -2.5 with history of fragility fracture Note: although not part of the WHO classification, the presence of a fragility fracture, regardless of the T-score, should be considered diagnostic of osteoporosis, provided other causes for the fracture have been excluded. Treatment: The National Osteoporosis Foundation recommends that treatment be considered in postmenopausal women and men age 35 or older with: 1. Hip or vertebral (clinical or morphometric) fracture 2. T-score of - 2.5 or lower at the spine or hip 3. 10-year fracture probability by FRAX of at least 20% for a major osteoporotic fracture and 3% for a hip fracture Lela Fendt, MD [facility]    Assessment & Plan:   Essential hypertension- BP is well controlled. -     TSH; Future -     CBC with Differential/Platelet; Future -     Basic metabolic panel with GFR; Future -     Potassium Chloride  ER; Take 1  tablet (10 mEq total) by mouth 3 (three) times daily.  Dispense: 270 tablet; Refill: 1  Prediabetes -     Hemoglobin A1c; Future -     Basic metabolic panel with GFR; Future  Diastolic dysfunction without heart failure- No signs of fluid excess.  Hypertriglyceridemia -     Lipid panel; Future  Hyperlipidemia LDL goal <130- LDL goal achieved. Doing well on the statin  -     Lipid panel; Future -     TSH; Future  Encounter for general adult medical examination with abnormal findings - Exam completed, labs reviewed, vaccines reviewed, cancer screenings are UTD, pt ed material was given.   Esophageal dysphagia -     Ambulatory referral to Gastroenterology  Gastroesophageal reflux disease with esophagitis without hemorrhage -     Esomeprazole  Magnesium ; Take 1 capsule (40 mg total) by mouth daily.  Dispense: 90 capsule; Refill: 1  Bradycardia with 51-60 beats per minute -     EKG 12-Lead -     LONG TERM MONITOR (3-14 DAYS); Future  Diuretic-induced hypokalemia -     Potassium Chloride  ER; Take 1 tablet (10 mEq total) by mouth 3 (three) times daily.  Dispense: 270 tablet; Refill: 1  Syncope, unspecified syncope type -     LONG TERM MONITOR (3-14 DAYS); Future     Follow-up: Return in about 6 months (around 06/27/2024).  Debby Molt, MD

## 2023-12-27 ENCOUNTER — Ambulatory Visit: Payer: Self-pay | Admitting: Internal Medicine

## 2023-12-27 ENCOUNTER — Other Ambulatory Visit: Payer: Self-pay | Admitting: Internal Medicine

## 2023-12-27 DIAGNOSIS — T502X5A Adverse effect of carbonic-anhydrase inhibitors, benzothiadiazides and other diuretics, initial encounter: Secondary | ICD-10-CM | POA: Insufficient documentation

## 2023-12-27 DIAGNOSIS — F332 Major depressive disorder, recurrent severe without psychotic features: Secondary | ICD-10-CM

## 2023-12-27 DIAGNOSIS — F410 Panic disorder [episodic paroxysmal anxiety] without agoraphobia: Secondary | ICD-10-CM

## 2023-12-27 LAB — HEMOGLOBIN A1C: Hgb A1c MFr Bld: 6.3 % (ref 4.6–6.5)

## 2023-12-27 MED ORDER — POTASSIUM CHLORIDE ER 10 MEQ PO TBCR: 10.0000 meq | EXTENDED_RELEASE_TABLET | Freq: Three times a day (TID) | ORAL | 1 refills | Status: DC

## 2023-12-29 DIAGNOSIS — R55 Syncope and collapse: Secondary | ICD-10-CM | POA: Insufficient documentation

## 2023-12-30 ENCOUNTER — Other Ambulatory Visit: Payer: Self-pay | Admitting: Internal Medicine

## 2023-12-30 DIAGNOSIS — I10 Essential (primary) hypertension: Secondary | ICD-10-CM

## 2023-12-30 DIAGNOSIS — I5189 Other ill-defined heart diseases: Secondary | ICD-10-CM

## 2023-12-31 ENCOUNTER — Ambulatory Visit: Attending: Internal Medicine

## 2023-12-31 DIAGNOSIS — R001 Bradycardia, unspecified: Secondary | ICD-10-CM

## 2023-12-31 DIAGNOSIS — R55 Syncope and collapse: Secondary | ICD-10-CM

## 2023-12-31 NOTE — Progress Notes (Unsigned)
 EP to read.

## 2024-01-02 NOTE — Telephone Encounter (Signed)
 Last OV 12/26/23 Next OV 03/11/24  Last refill(s) Jardiance  11/29/23 Qty #30/0  Torsemide  12/05/23 Qty #30/0

## 2024-01-07 ENCOUNTER — Other Ambulatory Visit: Payer: Self-pay | Admitting: Internal Medicine

## 2024-01-07 DIAGNOSIS — R000 Tachycardia, unspecified: Secondary | ICD-10-CM

## 2024-01-07 DIAGNOSIS — I10 Essential (primary) hypertension: Secondary | ICD-10-CM

## 2024-01-07 DIAGNOSIS — E785 Hyperlipidemia, unspecified: Secondary | ICD-10-CM

## 2024-01-09 NOTE — Telephone Encounter (Signed)
 Copied from CRM [id number]. Topic: General - Other >> Jan 09, 2024  3:02 PM Deaijah H wrote: Reason for CRM: Patient called in to return call to Jas please call [phone]

## 2024-01-09 NOTE — Progress Notes (Signed)
 Spoke with patient in regards to getting her scheduled for an appointment to discuss her CKD diagnosis with Dr. Joshua. After speaking with the patient she states that she will wait before she schedule this appointment and will call us  when she needs us .

## 2024-01-11 DIAGNOSIS — S61001A Unspecified open wound of right thumb without damage to nail, initial encounter: Secondary | ICD-10-CM | POA: Diagnosis not present

## 2024-01-22 DIAGNOSIS — R55 Syncope and collapse: Secondary | ICD-10-CM | POA: Diagnosis not present

## 2024-01-22 DIAGNOSIS — R001 Bradycardia, unspecified: Secondary | ICD-10-CM | POA: Diagnosis not present

## 2024-01-29 DIAGNOSIS — R55 Syncope and collapse: Secondary | ICD-10-CM | POA: Diagnosis not present

## 2024-01-29 DIAGNOSIS — R001 Bradycardia, unspecified: Secondary | ICD-10-CM | POA: Diagnosis not present

## 2024-02-05 ENCOUNTER — Ambulatory Visit (INDEPENDENT_AMBULATORY_CARE_PROVIDER_SITE_OTHER): Admitting: Internal Medicine

## 2024-02-05 ENCOUNTER — Encounter: Payer: Self-pay | Admitting: Internal Medicine

## 2024-02-05 VITALS — BP 142/82 | HR 82 | Temp 98.2°F | Resp 16 | Ht 61.0 in | Wt 144.4 lb

## 2024-02-05 DIAGNOSIS — Z23 Encounter for immunization: Secondary | ICD-10-CM

## 2024-02-05 DIAGNOSIS — R55 Syncope and collapse: Secondary | ICD-10-CM

## 2024-02-05 DIAGNOSIS — F410 Panic disorder [episodic paroxysmal anxiety] without agoraphobia: Secondary | ICD-10-CM

## 2024-02-05 DIAGNOSIS — I10 Essential (primary) hypertension: Secondary | ICD-10-CM

## 2024-02-05 DIAGNOSIS — N1831 Chronic kidney disease, stage 3a: Secondary | ICD-10-CM

## 2024-02-05 DIAGNOSIS — E876 Hypokalemia: Secondary | ICD-10-CM | POA: Diagnosis not present

## 2024-02-05 DIAGNOSIS — R001 Bradycardia, unspecified: Secondary | ICD-10-CM

## 2024-02-05 MED ORDER — CLONAZEPAM 0.5 MG PO TABS: 0.5000 mg | ORAL_TABLET | Freq: Three times a day (TID) | ORAL | 0 refills | Status: AC | PRN

## 2024-02-05 NOTE — Progress Notes (Signed)
 Subjective:  Patient ID: Vicki Martin, female    DOB: March 27, 1951  Age: 73 y.o. MRN: [id number]  CC: Results (Discuss monitor results )   HPI Vicki Martin presents for ***  Discussed the use of AI scribe software for clinical note transcription with the patient, who gave verbal consent to proceed.  History of Present Illness Vicki Martin is a 73 year old female who presents with anxiety, heart fluttering, and dizziness.  She experiences significant anxiety attributed to family stress involving her grandson, leading to emotional distress and episodes of panic attacks. She is not on any medication specifically for anxiety, although she is taking Cymbalta , which she was unaware is an antidepressant.  She describes her heart rate as 'fluttering' and experiences shortness of breath and dizziness. She has a history of syncope, with episodes occurring about a month ago and more recently within the last two weeks. No injuries have resulted from these episodes.  She reports swelling around her ankles and fingers, attributing it to arthritis. She reports that she has been taking Jardiance , as prescribed.  No recent injuries from passing out. No issues with her stomach.     Outpatient Medications Prior to Visit  Medication Sig Dispense Refill   atorvastatin  (LIPITOR) 40 MG tablet Take 1 tablet by mouth once daily 90 tablet 0   carvedilol  (COREG ) 12.5 MG tablet TAKE 1 TABLET BY MOUTH TWICE DAILY WITH A MEAL 180 tablet 0   DULoxetine  (CYMBALTA ) 60 MG capsule Take 1 capsule by mouth once daily 90 capsule 0   empagliflozin  (JARDIANCE ) 10 MG TABS tablet TAKE 1 TABLET BY MOUTH ONCE DAILY BEFORE BREAKFAST . APPOINTMENT REQUIRED FOR FUTURE REFILLS 90 tablet 1   esomeprazole  (NEXIUM ) 40 MG capsule Take 1 capsule (40 mg total) by mouth daily. 90 capsule 1   torsemide  (DEMADEX ) 20 MG tablet TAKE 1 TABLET BY MOUTH ONCE DAILY . APPOINTMENT REQUIRED FOR FUTURE REFILLS 90 tablet 1   potassium  chloride (KLOR-CON  M) 10 MEQ tablet Take 10 mEq by mouth 3 (three) times daily.     potassium chloride  (KLOR-CON  10) 10 MEQ tablet Take 1 tablet (10 mEq total) by mouth 3 (three) times daily. 270 tablet 1   No facility-administered medications prior to visit.    ROS Review of Systems  Objective:  BP (!) 142/82 (BP Location: Left Arm, Patient Position: Sitting, Cuff Size: Normal)   Pulse 82   Temp 98.2 F (36.8 C) (Oral)   Resp 16   Ht 5' 1 (1.549 m)   Wt 144 lb 6.4 oz (65.5 kg)   SpO2 98%   BMI 27.28 kg/m   BP Readings from Last 3 Encounters:  02/05/24 (!) 142/82  12/26/23 136/80  06/04/23 (!) 160/88    Wt Readings from Last 3 Encounters:  02/05/24 144 lb 6.4 oz (65.5 kg)  12/26/23 144 lb 9.6 oz (65.6 kg)  06/04/23 145 lb (65.8 kg)    Physical Exam  Lab Results  Component Value Date   WBC 6.2 12/26/2023   HGB 14.0 12/26/2023   HCT 42.1 12/26/2023   PLT 283.0 12/26/2023   GLUCOSE 140 (H) 02/05/2024   CHOL 113 12/26/2023   TRIG 116.0 12/26/2023   HDL 52.70 12/26/2023   LDLDIRECT 40.0 10/24/2018   LDLCALC 37 12/26/2023   ALT 27 06/05/2023   AST 27 06/05/2023   NA 139 02/05/2024   K 3.2 (L) 02/05/2024   CL 99 02/05/2024   CREATININE 1.11 02/05/2024   BUN 9  02/05/2024   CO2 30 02/05/2024   TSH 3.63 12/26/2023   HGBA1C 6.3 12/26/2023    DG Bone Density Result Date: 02/01/2023 Table formatting from the original result was not included. Date of study: 02/01/2023 Exam: DUAL X-RAY ABSORPTIOMETRY (DXA) FOR BONE MINERAL DENSITY (BMD) Instrument: [employer] Requesting Provider: PCP Indication: Screening for osteoporosis Comparison: 12/12/2016 Clinical data: Pt is a 73 y.o. female without previous history of fracture. Results:  Lumbar spine L1-L4 Femoral neck (FN) 33% distal radius T-score +0.5 RFN: -1.4 LFN: -1.1 n/a Change in BMD from previous DXA test (%) +6%* -10.6%* n/a (*) statistically significant Assessment: the BMD is low according to the [facility]  classification for osteoporosis (see below). Fracture risk: moderate FRAX score: 10 year major osteoporotic risk: 10.2%. 10 year hip fracture risk: 1.6%. The thresholds for treatment are 20% and 3%, respectively. Comments: the technical quality of the study is good. Evaluation for secondary causes should be considered if clinically indicated. Recommend optimizing calcium  (1200 mg/day) and vitamin D (800 IU/day) intake. Followup: Repeat BMD is appropriate after 1 to 2 years. WHO criteria for diagnosis of osteoporosis in postmenopausal women and in men 48 y/o or older: - normal: T-score -1.0 to + 1.0 - osteopenia/low bone density: T-score between -2.5 and -1.0 - osteoporosis: T-score below -2.5 - severe osteoporosis: T-score below -2.5 with history of fragility fracture Note: although not part of the WHO classification, the presence of a fragility fracture, regardless of the T-score, should be considered diagnostic of osteoporosis, provided other causes for the fracture have been excluded. Treatment: The National Osteoporosis Foundation recommends that treatment be considered in postmenopausal women and men age 42 or older with: 1. Hip or vertebral (clinical or morphometric) fracture 2. T-score of - 2.5 or lower at the spine or hip 3. 10-year fracture probability by FRAX of at least 20% for a major osteoporotic fracture and 3% for a hip fracture Lela Fendt, MD  Endocrinology    Assessment & Plan:  Syncope, unspecified syncope type -     Ambulatory referral to Cardiology  Bradycardia with 51-60 beats per minute -     Ambulatory referral to Cardiology  Panic anxiety syndrome -     clonazePAM ; Take 1 tablet (0.5 mg total) by mouth 3 (three) times daily as needed for anxiety.  Dispense: 270 tablet; Refill: 0  Hypokalemia -     Basic metabolic panel with GFR; Future -     Magnesium ; Future -     Potassium Chloride  Crys ER; Take 1 tablet (10 mEq total) by mouth 3 (three) times daily.  Dispense:  270 tablet; Refill: 0  Essential hypertension -     Basic metabolic panel with GFR; Future -     Potassium Chloride  Crys ER; Take 1 tablet (10 mEq total) by mouth 3 (three) times daily.  Dispense: 270 tablet; Refill: 0  Stage 3a chronic kidney disease (HCC) -     Basic metabolic panel with GFR; Future  Need for immunization against influenza -     Flu vaccine HIGH DOSE PF(Fluzone Trivalent)     Follow-up: Return in about 3 months (around 05/06/2024).  Debby Molt, MD

## 2024-02-05 NOTE — Patient Instructions (Signed)
 Chronic Kidney Disease in Adults: What to Know Chronic kidney disease (CKD) is when lasting damage happens to the kidneys slowly over time. The kidneys are two organs that do many important things in the body. These include: Taking waste and extra fluid out of the blood to make pee (urine). Making hormones. Keeping the right amount of fluids and chemicals in the body. A small amount of kidney damage may not cause problems. You must take steps to help keep the kidney damage from getting worse. A lot of damage may cause kidney failure. Kidney failure means the kidneys can no longer work right. What are the causes? Diabetes. High blood pressure. Diseases that affect the heart and blood vessels. Other kidney diseases. Diseases that affect the body's defense system (immune system). A problem with the flow of pee. This may be caused by: Kidney stones. Cancer. An enlarged prostate, in males. A kidney infection or urinary tract infection (UTI) that keeps coming back. What increases the risk? Getting older. The chances of having CKD increase with age. A family history of kidney disease or kidney failure. Having a disease caused by genes. Taking medicines that can harm the kidneys. Being near or having contact with harmful substances. Being very overweight. Using tobacco now or in the past. What are the signs or symptoms? Common symptoms of CKD include: Feeling very tired and having less energy. Swelling of the face, legs, ankles, or feet. Throwing up or feeling like you may throw up. Not wanting to eat as much as normal. Being confused or not able to focus. Twitches and cramps in the leg muscles or other muscles. Dry, itchy skin. Other symptoms may include: Shortness of breath. Trouble sleeping. Making less pee, or making more pee, especially at night. A taste of metal in your mouth. You may also become anemic. Anemia means there's not enough red blood cells in your blood. You may get  symptoms slowly. You may not notice them until the kidney damage gets very bad. How is this diagnosed? CKD may be diagnosed based on: Tests on your blood or pee. Imaging tests, like an ultrasound or a CT scan. A kidney biopsy. For this test, a sample of kidney tissue is removed to be looked at under a microscope. These tests will help to find out how serious the CKD is. How is this treated? Often, there's no cure for CKD. Treatment can help with symptoms and help keep the disease from getting worse. Treatment may include: Treating other problems that are causing your CKD or making it worse. Diet changes. You may need to: Avoid alcohol. Avoid foods that are high in salt, potassium, phosphorous, and protein. Taking medicines for symptoms and to help control other conditions. Dialysis. This treatment gets harmful waste out of your body. It may be needed if you have kidney failure. Follow these instructions at home: Medicines Take your medicines only as told. The amount of some medicines you take may need to be changed. Do not take any new medicines, vitamins, or supplements unless your health care provider says it's okay. These may make kidney damage worse. Lifestyle Do not smoke, vape, or use nicotine or tobacco. If you drink alcohol: Limit how much you have to: 0-1 drink a day if you're female. 0-2 drinks a day if you're female. Know how much alcohol is in your drink. In the [place] one drink is one 12 oz bottle of beer (355 mL), one 5 oz glass of wine (148 mL), or one 1 oz  glass of hard liquor (44 mL). Stay at a healthy weight. If you need help, ask your provider. General instructions  Eat and drink as told. Track your blood pressure at home. Tell your provider about any changes. If you have diabetes, track your blood sugar as told. Exercise at least 30 minutes a day, 5 days a week. Keep your shots (vaccinations) up to date. Keep all follow-up visits. Your provider may need to change  your treatments over time. Where to find support American Kidney Fund: [email or web address].com.au Kidney School: kidneyschool.org American Association of Kidney Patients: https://www.miller-montoya.com/ Where to find more information National Kidney Foundation: kidney.org Centers for Disease Control and Prevention. To learn more: Go to DiningCalendar.de. Click "Search". Type "chronic kidney disease" in the search box. Contact a health care provider if: You have new symptoms. You get symptoms of end-stage kidney disease. These include: Headaches. Numbness in your hands or feet. Leg cramps. Easy bruising. Get help right away if: You have a fever. You make less pee than usual. You have pain or bleeding when you pee or poop. You have chest pain. You have shortness of breath. These symptoms may be an emergency. Call 911 right away. Do not wait to see if the symptoms will go away. Do not drive yourself to the hospital. This information is not intended to replace advice given to you by your health care provider. Make sure you discuss any questions you have with your health care provider. Document Revised: 03/27/2023 Document Reviewed: 11/17/2022 Elsevier Patient Education  2024 [employer].

## 2024-02-06 ENCOUNTER — Ambulatory Visit: Payer: Self-pay | Admitting: Internal Medicine

## 2024-02-06 LAB — BASIC METABOLIC PANEL WITH GFR
BUN: 9 mg/dL (ref 6–23)
CO2: 30 meq/L (ref 19–32)
Calcium: 9.2 mg/dL (ref 8.4–10.5)
Chloride: 99 meq/L (ref 96–112)
Creatinine, Ser: 1.11 mg/dL (ref 0.40–1.20)
GFR: 49.32 mL/min — ABNORMAL LOW (ref >60.00)
Glucose, Bld: 140 mg/dL — ABNORMAL HIGH (ref 70–99)
Potassium: 3.2 meq/L — ABNORMAL LOW (ref 3.5–5.1)
Sodium: 139 meq/L (ref 135–145)

## 2024-02-06 LAB — MAGNESIUM: Magnesium: 1.9 mg/dL (ref 1.5–2.5)

## 2024-02-06 MED ORDER — POTASSIUM CHLORIDE CRYS ER 10 MEQ PO TBCR: 10.0000 meq | EXTENDED_RELEASE_TABLET | Freq: Three times a day (TID) | ORAL | 0 refills | Status: DC

## 2024-02-13 ENCOUNTER — Encounter: Payer: Self-pay | Admitting: Gastroenterology

## 2024-03-06 DIAGNOSIS — Z1231 Encounter for screening mammogram for malignant neoplasm of breast: Secondary | ICD-10-CM | POA: Diagnosis not present

## 2024-03-06 LAB — HM MAMMOGRAPHY

## 2024-03-10 ENCOUNTER — Encounter: Payer: Self-pay | Admitting: Internal Medicine

## 2024-03-26 ENCOUNTER — Other Ambulatory Visit: Payer: Self-pay | Admitting: Internal Medicine

## 2024-03-26 DIAGNOSIS — F332 Major depressive disorder, recurrent severe without psychotic features: Secondary | ICD-10-CM

## 2024-03-26 DIAGNOSIS — F410 Panic disorder [episodic paroxysmal anxiety] without agoraphobia: Secondary | ICD-10-CM

## 2024-04-08 ENCOUNTER — Ambulatory Visit: Admitting: Gastroenterology

## 2024-04-08 NOTE — Progress Notes (Deleted)
 Vicki Martin [id number] March 07, 1951   Chief Complaint:  Referring Provider: Joshua Debby CROME, MD Primary GI MD: Dr. Abran  HPI: Vicki Martin is a 73 y.o. female with past medical history of anxiety/depression, family history of pancreatic and stomach cancer, GERD, HLD, HTN, sleep apnea, migraines, hysterectomy, cholecystectomy who presents today for a complaint of *** .    Seen by PCP 12/26/2023 for dizziness, lightheadedness, and recent falls.  At that time also endorsed dysphagia ongoing for about 6 months.  Had recently had to increase her OTC reflux medication.  Was started on esomeprazole  40 mg once daily and referred to GI for further evaluation of dysphagia. She was placed on a long-term heart monitor for bradycardia and syncope.  Labs 12/26/2023 showed normal CBC, low potassium 3.2 and was subsequently started on a supplement, normal TSH, normal magnesium , normal lipid panel.  At follow-up visit in September was referred to cardiology.  She has an appointment with cardiology 11/13.     Discussed the use of AI scribe software for clinical note transcription with the patient, who gave verbal consent to proceed.  History of Present Illness       Previous GI Procedures/Imaging   Colonoscopy 09/17/2015 - The entire examined colon is normal on direct and retroflexion views. - No specimens collected. - Recall 10 years  Past Medical History:  Diagnosis Date   Anxiety    Arthritis    Depression    Family history of pancreatic cancer    Family history of stomach cancer    GERD (gastroesophageal reflux disease)    Heart murmur    Hyperlipidemia    no medications   Hypertension    Migraines    Sleep apnea    pt declines CPAP    Past Surgical History:  Procedure Laterality Date   ABDOMINAL HYSTERECTOMY     reports complet but with ovaries retained   CHOLECYSTECTOMY     removed hip bone to replace vertebrae      Current Outpatient Medications  Medication  Sig Dispense Refill   atorvastatin  (LIPITOR) 40 MG tablet Take 1 tablet by mouth once daily 90 tablet 0   carvedilol  (COREG ) 12.5 MG tablet TAKE 1 TABLET BY MOUTH TWICE DAILY WITH A MEAL 180 tablet 0   clonazePAM  (KLONOPIN ) 0.5 MG tablet Take 1 tablet (0.5 mg total) by mouth 3 (three) times daily as needed for anxiety. 270 tablet 0   DULoxetine  (CYMBALTA ) 60 MG capsule Take 1 capsule by mouth once daily 90 capsule 0   empagliflozin  (JARDIANCE ) 10 MG TABS tablet TAKE 1 TABLET BY MOUTH ONCE DAILY BEFORE BREAKFAST . APPOINTMENT REQUIRED FOR FUTURE REFILLS 90 tablet 1   esomeprazole  (NEXIUM ) 40 MG capsule Take 1 capsule (40 mg total) by mouth daily. 90 capsule 1   potassium chloride  (KLOR-CON  M) 10 MEQ tablet Take 1 tablet (10 mEq total) by mouth 3 (three) times daily. 270 tablet 0   torsemide  (DEMADEX ) 20 MG tablet TAKE 1 TABLET BY MOUTH ONCE DAILY . APPOINTMENT REQUIRED FOR FUTURE REFILLS 90 tablet 1   No current facility-administered medications for this visit.    Allergies as of 04/08/2024 - Review Complete 02/05/2024  Allergen Reaction Noted   Codeine Other (See Comments) 07/27/2014   Penicillins Rash 07/27/2014    Family History  Problem Relation Age of Onset   Rheum arthritis Mother    Stomach cancer Mother 88   Pancreatic cancer Mother 35   Heart disease Father    Hypertension  Sister    Cancer Sister        lung   Lung cancer Brother        deceased   Early death Brother    Liver cancer Brother    Hypertension Brother    COPD Brother    Ovarian cysts Daughter    Stomach cancer Maternal Aunt    Pancreatic cancer Maternal Aunt    Cancer Maternal Aunt        NOS   Cancer Maternal Uncle        NOS   Lung cancer Other    Diabetes Neg Hx    Alcohol abuse Neg Hx    Stroke Neg Hx    Colon cancer Neg Hx    Esophageal cancer Neg Hx    Rectal cancer Neg Hx     Social History   Tobacco Use   Smoking status: Never   Smokeless tobacco: Never  Vaping Use   Vaping status:  Never Used  Substance Use Topics   Alcohol use: No    Alcohol/week: 0.0 standard drinks of alcohol   Drug use: No     Review of Systems:    Constitutional: No weight loss, fever, chills, weakness or fatigue Eyes: No change in vision Ears, Nose, Throat:  No change in hearing or congestion Skin: No rash or itching Cardiovascular: No chest pain, chest pressure or palpitations   Respiratory: No SOB or cough Gastrointestinal: See HPI and otherwise negative Genitourinary: No dysuria or change in urinary frequency Neurological: No headache, dizziness or syncope Musculoskeletal: No new muscle or joint pain Hematologic: No bleeding or bruising    Physical Exam:  Vital signs: There were no vitals taken for this visit.  Constitutional: NAD, Well developed, Well nourished, alert and cooperative Head:  Normocephalic and atraumatic.  Eyes: No scleral icterus. Conjunctiva pink. Mouth: No oral lesions. Respiratory: Respirations even and unlabored. Lungs clear to auscultation bilaterally.  No wheezes, crackles, or rhonchi.  Cardiovascular:  Regular rate and rhythm. No murmurs. No peripheral edema. Gastrointestinal:  Soft, nondistended, nontender. No rebound or guarding. Normal bowel sounds. No appreciable masses or hepatomegaly. Rectal:  Not performed.  Neurologic:  Alert and oriented x4;  grossly normal neurologically.  Skin:   Dry and intact without significant lesions or rashes. Psychiatric: Oriented to person, place and time. Demonstrates good judgement and reason without abnormal affect or behaviors.   RELEVANT LABS AND IMAGING: CBC    Component Value Date/Time   WBC 6.2 12/26/2023 1547   RBC 4.63 12/26/2023 1547   HGB 14.0 12/26/2023 1547   HCT 42.1 12/26/2023 1547   PLT 283.0 12/26/2023 1547   MCV 90.9 12/26/2023 1547   MCH 30.0 06/11/2020 1859   MCHC 33.3 12/26/2023 1547   RDW 13.9 12/26/2023 1547   LYMPHSABS 1.3 12/26/2023 1547   MONOABS 0.6 12/26/2023 1547   EOSABS 0.3  12/26/2023 1547   BASOSABS 0.1 12/26/2023 1547    CMP     Component Value Date/Time   NA 139 02/05/2024 1636   NA 142 04/05/2017 1558   K 3.2 (L) 02/05/2024 1636   CL 99 02/05/2024 1636   CO2 30 02/05/2024 1636   GLUCOSE 140 (H) 02/05/2024 1636   BUN 9 02/05/2024 1636   BUN 10 04/05/2017 1558   CREATININE 1.11 02/05/2024 1636   CREATININE 0.83 12/01/2015 1457   CALCIUM  9.2 02/05/2024 1636   PROT 6.7 06/05/2023 1434   ALBUMIN 4.2 06/05/2023 1434   AST 27 06/05/2023 1434  ALT 27 06/05/2023 1434   ALKPHOS 106 06/05/2023 1434   BILITOT 0.8 06/05/2023 1434   GFRNONAA 49 (L) 06/11/2020 1859   GFRAA >60 09/12/2019 1915   Echocardiogram 06/06/2023 1. Left ventricular ejection fraction, by estimation, is 60 to 65% . Left ventricular ejection fraction by 3D volume is 64 % . The left ventricle has normal function. The left ventricle has no regional wall motion abnormalities. There is mild left ventricular hypertrophy. Left ventricular diastolic parameters are consistent with Grade I diastolic dysfunction ( impaired relaxation) .  2. Right ventricular systolic function is normal. The right ventricular size is normal. There is normal pulmonary artery systolic pressure. The estimated right ventricular systolic pressure is 19. 2 mmHg.  3. The mitral valve is normal in structure. Trivial mitral valve regurgitation.  4. The aortic valve is tricuspid. Aortic valve regurgitation is not visualized. No aortic stenosis is present.  5. The inferior vena cava is normal in size with greater than 50% respiratory variability, suggesting right atrial pressure of 3 mmHg.  Assessment/Plan:    Assessment and Plan Assessment & Plan    EGD with possible dilation and cardiac clearance due to recent syncope and bradycardia Consider scheduling procedure out and getting barium swallow with tablet while awaiting cardiac evaluation   Camie Furbish, PA-C [facility] 04/08/2024, 10:24 AM  Patient  Care Team: Joshua Debby CROME, MD as PCP - General (Internal Medicine) Nahser, Aleene PARAS, MD (Inactive) as PCP - Cardiology (Cardiology)

## 2024-04-09 ENCOUNTER — Other Ambulatory Visit: Payer: Self-pay | Admitting: Internal Medicine

## 2024-04-09 DIAGNOSIS — R000 Tachycardia, unspecified: Secondary | ICD-10-CM

## 2024-04-09 DIAGNOSIS — I10 Essential (primary) hypertension: Secondary | ICD-10-CM

## 2024-04-09 NOTE — Progress Notes (Signed)
 Cardiology Office Note:   Date:  04/10/2024  ID:  Vicki Martin, DOB 24-Dec-1950, MRN [id number] PCP: Vicki Debby CROME, MD  [facility] Providers Cardiologist:  Lynwood Schilling, MD {  History of Present Illness:   Vicki Martin is a 73 y.o. female who was last seen by Dr. Alveta in 2021.  She was seen for evaluation of a heart murmur.   She also had chest pain with a calcium  score of zero and no CAD on CT in 2018..  She presents for evaluation of bradycardia and syncope.  A monitor demonstrated NSR with 7 beats of SVT with the longest run being 14 beats.   She says that she does get lightheaded sometimes with standing.  She said that the last time she passed out was about a month ago and she was walking from the door to her couch when she just had to be eased down onto her couch because she got lightheaded.  She did not have any events where she was wearing the monitor.  She denies any palpitations.  Echo demonstrated a normal EF with mild LVH.     She also describes chest discomfort that has been ongoing.  It happens sporadically.  It was last happening a couple of days ago at rest.  It was midsternal and through to her back.  It lasted for 45 to 60 minutes.  She was diaphoretic and short of breath.  She said it was 9 out of 10 in intensity.  It was not positional.  It was not associated with any foods and it was at rest.  She did not take anything to try to get rid of it.  It went away spontaneously.  She says this is happening about 2 times a week.  She is relatively sedentary but doing minimal activities around the house does not bring it on.  ROS: As stated in the HPI and negative for all other systems.  Studies Reviewed:    EKG:   EKG Interpretation Date/Time:  Thursday April 10 2024 15:04:58 EST Ventricular Rate:  67 PR Interval:  154 QRS Duration:  124 QT Interval:  420 QTC Calculation: 443 R Axis:   21  Text Interpretation: Normal sinus rhythm Left bundle branch  block When compared with ECG of 13-Sep-2019 03:24, No significant change since last tracing Confirmed by Schilling Lynwood ([id number]) on 04/10/2024 3:22:12 PM     Risk Assessment/Calculations:       Physical Exam:   VS:  BP (!) 149/79   Pulse 66   Ht 5' 1 (1.549 m)   Wt 145 lb 3.2 oz (65.9 kg)   SpO2 96%   BMI 27.44 kg/m    Wt Readings from Last 3 Encounters:  04/10/24 145 lb 3.2 oz (65.9 kg)  02/05/24 144 lb 6.4 oz (65.5 kg)  12/26/23 144 lb 9.6 oz (65.6 kg)     GEN: Well nourished, well developed in no acute distress NECK: No JVD; No carotid bruits CARDIAC: RRR, no murmurs, rubs, gallops RESPIRATORY:  Clear to auscultation without rales, wheezing or rhonchi  ABDOMEN: Soft, non-tender, non-distended EXTREMITIES:  No edema; No deformity   ASSESSMENT AND PLAN:    Syncope: The etiology of this is not clear.  If this continues she might need a loop monitor and I will consider this.  I suspect this is more related to low blood pressures although in the office today she was  not orthostatic.  Bradycardia: Again this will be evaluated as  above.  Chest pain: This has some typical and some nontypical features.  EKG would be not interpretable so I am not going to do a stress test.  I will send her for coronary CTA.  If this is unremarkable I would suggest GI follow-up.  She apparently does have this scheduled to having missed her recent consultation.    Follow up with me in about 2 months.   Signed, Lynwood Schilling, MD

## 2024-04-10 ENCOUNTER — Encounter: Payer: Self-pay | Admitting: Cardiology

## 2024-04-10 ENCOUNTER — Ambulatory Visit: Attending: Cardiology | Admitting: Cardiology

## 2024-04-10 VITALS — BP 149/79 | HR 66 | Ht 61.0 in | Wt 145.2 lb

## 2024-04-10 DIAGNOSIS — Z01812 Encounter for preprocedural laboratory examination: Secondary | ICD-10-CM | POA: Diagnosis not present

## 2024-04-10 DIAGNOSIS — R55 Syncope and collapse: Secondary | ICD-10-CM | POA: Diagnosis not present

## 2024-04-10 DIAGNOSIS — R001 Bradycardia, unspecified: Secondary | ICD-10-CM | POA: Diagnosis not present

## 2024-04-10 DIAGNOSIS — R072 Precordial pain: Secondary | ICD-10-CM | POA: Diagnosis not present

## 2024-04-10 MED ORDER — METOPROLOL TARTRATE 25 MG PO TABS: 25.0000 mg | ORAL_TABLET | Freq: Once | ORAL | 0 refills | Status: DC

## 2024-04-10 NOTE — Patient Instructions (Signed)
 Medication Instructions:  Your physician recommends that you continue on your current medications as directed. Please refer to the Current Medication list given to you today.  *If you need a refill on your cardiac medications before your next appointment, please call your pharmacy*  Lab Work: BMET today at [facility] If you have labs (blood work) drawn today and your tests are completely normal, you will receive your results only by: MyChart Message (if you have MyChart) OR A paper copy in the mail If you have any lab test that is abnormal or we need to change your treatment, we will call you to review the results.  Testing/Procedures: Coronary CTA  Follow-Up: At [facility], you and your health needs are our priority.  As part of our continuing mission to provide you with exceptional heart care, our providers are all part of one team.  This team includes your primary Cardiologist (physician) and Advanced Practice Providers or APPs (Physician Assistants and Nurse Practitioners) who all work together to provide you with the care you need, when you need it.  Your next appointment:   2 month(s)  Provider:   [place], MD  We recommend signing up for the patient portal called MyChart.  Sign up information is provided on this After Visit Summary.  MyChart is used to connect with patients for Virtual Visits (Telemedicine).  Patients are able to view lab/test results, encounter notes, upcoming appointments, etc.  Non-urgent messages can be sent to your provider as well.   To learn more about what you can do with MyChart, go to [email or web address].   Other Instructions   Your cardiac CT will be scheduled at one of the below locations:   [facility] [address]  [place], [place] [address]  If scheduled at the [facility] at [address], please enter the parking lot using the [address] entrance and use the FREE valet service  at the patient drop-off area. Enter the building and check-in with registration on the main floor.  Please follow these instructions carefully (unless otherwise directed):  An IV will be required for this test and Nitroglycerin  will be given.  Hold all erectile dysfunction medications at least 3 days (72 hrs) prior to test. (Ie viagra, cialis, sildenafil, tadalafil, etc)   On the Night Before the Test: Be sure to Drink plenty of water. Do not consume any caffeinated/decaffeinated beverages or chocolate 12 hours prior to your test. Do not take any antihistamines 12 hours prior to your test.  On the Day of the Test: Drink plenty of water until 1 hour prior to the test. Do not eat any food 1 hour prior to test. You may take your regular medications prior to the test.  Take metoprolol  (Lopressor ) 25 mg two hours prior to test. If you take Furosemide/Hydrochlorothiazide/Spironolactone /Chlorthalidone, please HOLD on the morning of the test. (HOLD TORSEMIDE  the day of the CT) Patients who wear a continuous glucose monitor MUST remove the device prior to scanning. FEMALES- please wear underwire-free bra if available, avoid dresses & tight clothing  After the Test: Drink plenty of water. After receiving IV contrast, you may experience a mild flushed feeling. This is normal. On occasion, you may experience a mild rash up to 24 hours after the test. This is not dangerous. If this occurs, you can take Benadryl 25 mg, Zyrtec, Claritin, or Allegra and increase your fluid intake. (Patients taking Tikosyn should avoid Benadryl, and may take Zyrtec, Claritin, or Allegra)  If you experience trouble breathing, this can be serious. If it is severe call 911 IMMEDIATELY. If it is mild, please call our office.  We will call to schedule your test 2-4 weeks out understanding that some insurance companies will need an authorization prior to the service being performed.   For more information and frequently asked  questions, please visit our website : [email or web address]  For non-scheduling related questions, please contact the cardiac imaging nurse navigator should you have any questions/concerns: Cardiac Imaging Nurse Navigators Direct Office Dial: [phone]   For scheduling needs, including cancellations and rescheduling, please call Brittany, [phone].

## 2024-04-11 ENCOUNTER — Other Ambulatory Visit: Payer: Self-pay | Admitting: Internal Medicine

## 2024-04-11 DIAGNOSIS — Z01812 Encounter for preprocedural laboratory examination: Secondary | ICD-10-CM | POA: Diagnosis not present

## 2024-04-11 DIAGNOSIS — E785 Hyperlipidemia, unspecified: Secondary | ICD-10-CM

## 2024-04-12 ENCOUNTER — Ambulatory Visit: Payer: Self-pay | Admitting: Cardiology

## 2024-04-12 LAB — BASIC METABOLIC PANEL WITH GFR
BUN/Creatinine Ratio: 9 — ABNORMAL LOW (ref 12–28)
BUN: 10 mg/dL (ref 8–27)
CO2: 24 mmol/L (ref 20–29)
Calcium: 9.1 mg/dL (ref 8.7–10.3)
Chloride: 102 mmol/L (ref 96–106)
Creatinine, Ser: 1.07 mg/dL — ABNORMAL HIGH (ref 0.57–1.00)
Glucose: 100 mg/dL — ABNORMAL HIGH (ref 70–99)
Potassium: 4 mmol/L (ref 3.5–5.2)
Sodium: 142 mmol/L (ref 134–144)
eGFR: 55 mL/min/1.73 — ABNORMAL LOW (ref >59)

## 2024-04-15 ENCOUNTER — Other Ambulatory Visit: Payer: Self-pay | Admitting: Internal Medicine

## 2024-04-15 DIAGNOSIS — R000 Tachycardia, unspecified: Secondary | ICD-10-CM

## 2024-04-15 DIAGNOSIS — I10 Essential (primary) hypertension: Secondary | ICD-10-CM

## 2024-04-23 ENCOUNTER — Telehealth (HOSPITAL_COMMUNITY): Payer: Self-pay | Admitting: Emergency Medicine

## 2024-04-23 ENCOUNTER — Telehealth (HOSPITAL_COMMUNITY): Payer: Self-pay | Admitting: *Deleted

## 2024-04-23 NOTE — Telephone Encounter (Signed)
 Attempted to call patient regarding upcoming cardiac CT appointment. Left message on voicemail with name and callback number Rockwell Alexandria RN Navigator Cardiac Imaging [facility] [phone] Office [phone] Cell

## 2024-04-23 NOTE — Telephone Encounter (Signed)
 Patient returning call about her upcoming cardiac imaging study; pt verbalizes understanding of appt date/time, parking situation and where to check in, pre-test NPO status and medications ordered, and verified current allergies; name and call back number provided for further questions should they arise  Larey Brick RN Navigator Cardiac Imaging [facility] [phone] office [phone] cell  Patient to take 25mg  metoprolol tartrate two hours prior to her cardiac CT scan.

## 2024-04-25 ENCOUNTER — Ambulatory Visit (HOSPITAL_COMMUNITY)
Admission: RE | Admit: 2024-04-25 | Discharge: 2024-04-25 | Disposition: A | Discharge status: Home / Self Care | Source: Ambulatory Visit | Attending: Cardiology | Admitting: Cardiology

## 2024-04-25 DIAGNOSIS — R072 Precordial pain: Secondary | ICD-10-CM | POA: Diagnosis not present

## 2024-04-25 MED ORDER — NITROGLYCERIN 0.4 MG SL SUBL
0.8000 mg | SUBLINGUAL_TABLET | Freq: Once | SUBLINGUAL | Status: AC
  Administered 2024-04-25: 0.8 mg via SUBLINGUAL

## 2024-04-25 MED ORDER — IOHEXOL 350 MG/ML SOLN
100.0000 mL | Freq: Once | INTRAVENOUS | Status: AC | PRN
  Administered 2024-04-25: 100 mL via INTRAVENOUS

## 2024-05-01 ENCOUNTER — Other Ambulatory Visit: Payer: Self-pay | Admitting: Internal Medicine

## 2024-05-01 DIAGNOSIS — E876 Hypokalemia: Secondary | ICD-10-CM

## 2024-05-01 DIAGNOSIS — I10 Essential (primary) hypertension: Secondary | ICD-10-CM

## 2024-05-15 ENCOUNTER — Ambulatory Visit

## 2024-05-15 VITALS — Ht 61.0 in | Wt 145.0 lb

## 2024-05-15 DIAGNOSIS — H9193 Unspecified hearing loss, bilateral: Secondary | ICD-10-CM

## 2024-05-15 DIAGNOSIS — Z0000 Encounter for general adult medical examination without abnormal findings: Secondary | ICD-10-CM | POA: Diagnosis not present

## 2024-05-15 NOTE — Patient Instructions (Addendum)
 Ms. Bogle,  Thank you for taking the time for your Medicare Wellness Visit. I appreciate your continued commitment to your health goals. Please review the care plan we discussed, and feel free to reach out if I can assist you further.  Please note that Annual Wellness Visits do not include a physical exam. Some assessments may be limited, especially if the visit was conducted virtually. If needed, we may recommend an in-person follow-up with your provider.  Ongoing Care Seeing your primary care provider every 3 to 6 months helps us  monitor your health and provide consistent, personalized care.   Referrals If a referral was made during today's visit and you haven't received any updates within two weeks, please contact the referred provider directly to check on the status.  Recommended Screenings:  Health Maintenance  Topic Date Due   Zoster (Shingles) Vaccine (2 of 2) 03/21/2021   COVID-19 Vaccine (3 - 2025-26 season) 01/28/2024   Medicare Annual Wellness Visit  05/15/2025   DTaP/Tdap/Td vaccine (3 - Td or Tdap) 08/03/2025   Colon Cancer Screening  09/19/2025   Breast Cancer Screening  03/06/2026   Pneumococcal Vaccine for age over 86  Completed   Flu Shot  Completed   Osteoporosis screening with Bone Density Scan  Completed   Hepatitis C Screening  Completed   Meningitis B Vaccine  Aged Out       05/15/2024    3:58 PM  Advanced Directives  Does Patient Have a Medical Advance Directive? No  Would patient like information on creating a medical advance directive? No - Patient declined    Vision: Annual vision screenings are recommended for early detection of glaucoma, cataracts, and diabetic retinopathy. These exams can also reveal signs of chronic conditions such as diabetes and high blood pressure.  Dental: Annual dental screenings help detect early signs of oral cancer, gum disease, and other conditions linked to overall health, including heart disease and diabetes.

## 2024-05-15 NOTE — Progress Notes (Signed)
 Chief Complaint  Patient presents with   Medicare Wellness     Subjective:  Please attest and cosign this visit due to patients primary care provider not being in the office at the time the visit was completed.  (Pt of Dr Debby Molt)   Vicki Martin is a 73 y.o. female who presents for a Medicare Annual Wellness Visit.  Visit info / Clinical Intake: Medicare Wellness Visit Type:: Subsequent Annual Wellness Visit Persons participating in visit and providing information:: patient Medicare Wellness Visit Mode:: Telephone If telephone:: video declined Since this visit was completed virtually, some vitals may be partially provided or unavailable. Missing vitals are due to the limitations of the virtual format.: Documented vitals are patient reported If Telephone or Video please confirm:: I connected with patient using audio/video enable telemedicine. I verified patient identity with two identifiers, discussed telehealth limitations, and patient agreed to proceed. Patient Location:: Home Provider Location:: Office Interpreter Needed?: No Pre-visit prep was completed: yes AWV questionnaire completed by patient prior to visit?: no Living arrangements:: (!) lives alone Patient's Overall Health Status Rating: (!) fair Typical amount of pain: none Does pain affect daily life?: no Are you currently prescribed opioids?: no  Dietary Habits and Nutritional Risks How many meals a day?: (!) 1 (stated has no appetitie) Eats fruit and vegetables daily?: yes Most meals are obtained by: preparing own meals In the last 2 weeks, have you had any of the following?: none Diabetic:: no  Functional Status Activities of Daily Living (to include ambulation/medication): Independent Ambulation: Independent with device- listed below (wears reading eyeglasses) Home Assistive Devices/Equipment: Eyeglasses Medication Administration: Independent Home Management (perform basic housework or laundry):  Independent Manage your own finances?: yes Primary transportation is: driving Concerns about vision?: no *vision screening is required for WTM* Concerns about hearing?: (!) yes Uses hearing aids?: no Hear whispered voice?: yes  Fall Screening Falls in the past year?: 1 Number of falls in past year: 1 (3) Was there an injury with Fall?: 0 Fall Risk Category Calculator: 2 Patient Fall Risk Level: Moderate Fall Risk  Fall Risk Patient at Risk for Falls Due to: Impaired balance/gait; Other (Comment) (unsteady gait) Fall risk Follow up: Falls evaluation completed; Falls prevention discussed  Home and Transportation Safety: All rugs have non-skid backing?: N/A, no rugs All stairs or steps have railings?: N/A, no stairs Grab bars in the bathtub or shower?: yes Have non-skid surface in bathtub or shower?: yes Good home lighting?: yes Regular seat belt use?: yes  Cognitive Assessment Difficulty concentrating, remembering, or making decisions? : no Will 6CIT or Mini Cog be Completed: yes What year is it?: 0 points What month is it?: 0 points Give patient an address phrase to remember (5 components): [address] [place], [place] About what time is it?: 0 points Count backwards from 20 to 1: 0 points Say the months of the year in reverse: 0 points Repeat the address phrase from earlier: 2 points (February) 6 CIT Score: 2 points  Advance Directives (For Healthcare) Does Patient Have a Medical Advance Directive?: No Would patient like information on creating a medical advance directive?: No - Patient declined  Reviewed/Updated  Reviewed/Updated: Reviewed All (Medical, Surgical, Family, Medications, Allergies, Care Teams, Patient Goals)    Allergies (verified) Codeine and Penicillins   Current Medications (verified) Outpatient Encounter Medications as of 05/15/2024  Medication Sig   atorvastatin  (LIPITOR) 40 MG tablet Take 1 tablet by mouth once daily   carvedilol  (COREG ) 12.5  MG tablet  TAKE 1 TABLET BY MOUTH TWICE DAILY WITH A MEAL   clonazePAM  (KLONOPIN ) 0.5 MG tablet Take 1 tablet (0.5 mg total) by mouth 3 (three) times daily as needed for anxiety.   DULoxetine  (CYMBALTA ) 60 MG capsule Take 1 capsule by mouth once daily   empagliflozin  (JARDIANCE ) 10 MG TABS tablet TAKE 1 TABLET BY MOUTH ONCE DAILY BEFORE BREAKFAST . APPOINTMENT REQUIRED FOR FUTURE REFILLS   esomeprazole  (NEXIUM ) 40 MG capsule Take 1 capsule (40 mg total) by mouth daily.   metoprolol  tartrate (LOPRESSOR ) 25 MG tablet Take 1 tablet (25 mg total) by mouth once for 1 dose. Take 90-120 minutes prior to scan. Hold for SBP less than 110.   potassium chloride  (KLOR-CON ) 10 MEQ tablet TAKE 1 TABLET BY MOUTH THREE TIMES DAILY   torsemide  (DEMADEX ) 20 MG tablet TAKE 1 TABLET BY MOUTH ONCE DAILY . APPOINTMENT REQUIRED FOR FUTURE REFILLS   No facility-administered encounter medications on file as of 05/15/2024.    History: Past Medical History:  Diagnosis Date   Anxiety    Arthritis    Depression    Family history of pancreatic cancer    Family history of stomach cancer    GERD (gastroesophageal reflux disease)    Hyperlipidemia    no medications   Hypertension    Migraines    Sleep apnea    pt declines CPAP   Past Surgical History:  Procedure Laterality Date   ABDOMINAL HYSTERECTOMY     reports complet but with ovaries retained   CHOLECYSTECTOMY     removed hip bone to replace vertebrae     Family History  Problem Relation Age of Onset   Rheum arthritis Mother    Stomach cancer Mother 59   Pancreatic cancer Mother 71   Heart disease Father    Hypertension Sister    Cancer Sister        lung   Lung cancer Brother        deceased   Early death Brother    Liver cancer Brother    Hypertension Brother    COPD Brother    Ovarian cysts Daughter    Stomach cancer Maternal Aunt    Pancreatic cancer Maternal Aunt    Cancer Maternal Aunt        NOS   Cancer Maternal Uncle        NOS    Lung cancer Other    Diabetes Neg Hx    Alcohol abuse Neg Hx    Stroke Neg Hx    Colon cancer Neg Hx    Esophageal cancer Neg Hx    Rectal cancer Neg Hx    Social History   Occupational History   Occupation: REtired  Tobacco Use   Smoking status: Never   Smokeless tobacco: Never  Vaping Use   Vaping status: Never Used  Substance and Sexual Activity   Alcohol use: No    Alcohol/week: 0.0 standard drinks of alcohol   Drug use: No   Sexual activity: Not Currently    Partners: Male   Tobacco Counseling Counseling given: No  SDOH Screenings   Food Insecurity: No Food Insecurity (05/15/2024)  Housing: Unknown (05/15/2024)  Transportation Needs: No Transportation Needs (05/15/2024)  Utilities: Not At Risk (05/15/2024)  Alcohol Screen: Low Risk (03/09/2023)  Depression (PHQ2-9): Low Risk (05/15/2024)  Financial Resource Strain: Low Risk (03/09/2023)  Physical Activity: Inactive (05/15/2024)  Social Connections: Socially Isolated (05/15/2024)  Stress: No Stress Concern Present (05/15/2024)  Tobacco Use: Low Risk (05/15/2024)  Health Literacy: Adequate Health Literacy (05/15/2024)   See flowsheets for full screening details  Depression Screen PHQ 2 & 9 Depression Scale- Over the past 2 weeks, how often have you been bothered by any of the following problems? Little interest or pleasure in doing things: 0 Feeling down, depressed, or hopeless (PHQ Adolescent also includes...irritable): 0 PHQ-2 Total Score: 0 Trouble falling or staying asleep, or sleeping too much: 3 (only gets 4hrs of sleep - takes Clonazepam  sometimes) Feeling tired or having little energy: 0 Poor appetite or overeating (PHQ Adolescent also includes...weight loss): 0 Feeling bad about yourself - or that you are a failure or have let yourself or your family down: 0 Trouble concentrating on things, such as reading the newspaper or watching television (PHQ Adolescent also includes...like school work): 0 Moving  or speaking so slowly that other people could have noticed. Or the opposite - being so fidgety or restless that you have been moving around a lot more than usual: 0 Thoughts that you would be better off dead, or of hurting yourself in some way: 0 PHQ-9 Total Score: 3 If you checked off any problems, how difficult have these problems made it for you to do your work, take care of things at home, or get along with other people?: Not difficult at all  Depression Treatment Depression Interventions/Treatment : EYV7-0 Score <4 Follow-up Not Indicated; Medication; Currently on Treatment     Goals Addressed               This Visit's Progress     Patient Stated (pt-stated)        Patient stated she plans to stay active             Objective:    Today's Vitals   05/15/24 1555  Weight: 145 lb (65.8 kg)  Height: 5' 1 (1.549 m)   Body mass index is 27.4 kg/m.  Hearing/Vision screen Hearing Screening - Comments:: Referral Audiology - difficulty hearing Vision Screening - Comments:: Denies vision concerns - wears reading eyeglasses Immunizations and Health Maintenance Health Maintenance  Topic Date Due   Zoster Vaccines- Shingrix (2 of 2) 03/21/2021   COVID-19 Vaccine (3 - 2025-26 season) 01/28/2024   Medicare Annual Wellness (AWV)  05/15/2025   DTaP/Tdap/Td (3 - Td or Tdap) 08/03/2025   Colonoscopy  09/19/2025   Mammogram  03/06/2026   Pneumococcal Vaccine: 50+ Years  Completed   Influenza Vaccine  Completed   Bone Density Scan  Completed   Hepatitis C Screening  Completed   Meningococcal B Vaccine  Aged Out        Assessment/Plan:  This is a routine wellness examination for Siriah.  Referral to Audiology: pt c/o difficulty hearing  Patient Care Team: Joshua Debby CROME, MD as PCP - General (Internal Medicine) Lynwood Schilling, MD as PCP - Cardiology (Cardiology)  I have personally reviewed and noted the following in the patients chart:   Medical and social  history Use of alcohol, tobacco or illicit drugs  Current medications and supplements including opioid prescriptions. Functional ability and status Nutritional status Physical activity Advanced directives List of other physicians Hospitalizations, surgeries, and ER visits in previous 12 months Vitals Screenings to include cognitive, depression, and falls Referrals and appointments  Orders Placed This Encounter  Procedures   Ambulatory referral to Audiology    Referral Priority:   Routine    Referral Type:   Audiology Exam    Referral Reason:   Specialty Services Required  Referred to Provider:   Helane Darryle LABOR, AUD    Number of Visits Requested:   1   In addition, I have reviewed and discussed with patient certain preventive protocols, quality metrics, and best practice recommendations. A written personalized care plan for preventive services as well as general preventive health recommendations were provided to patient.   Verdie CHRISTELLA Saba, CMA   05/15/2024   Return in 1 year (on 05/15/2025).  After Visit Summary: (MyChart) Due to this being a telephonic visit, the after visit summary with patients personalized plan was offered to patient via MyChart   Nurse Notes: scheduled an appt w/PCP to f/u on poor appetite (1/2-1-meal per day) for 05/2024.

## 2024-05-29 ENCOUNTER — Other Ambulatory Visit: Payer: Self-pay | Admitting: Internal Medicine

## 2024-05-29 DIAGNOSIS — I10 Essential (primary) hypertension: Secondary | ICD-10-CM

## 2024-05-29 DIAGNOSIS — E876 Hypokalemia: Secondary | ICD-10-CM

## 2024-06-02 ENCOUNTER — Encounter: Payer: Self-pay | Admitting: Gastroenterology

## 2024-06-02 ENCOUNTER — Ambulatory Visit: Admitting: Gastroenterology

## 2024-06-02 VITALS — BP 102/60 | HR 66 | Ht 61.0 in | Wt 144.0 lb

## 2024-06-02 DIAGNOSIS — K219 Gastro-esophageal reflux disease without esophagitis: Secondary | ICD-10-CM

## 2024-06-02 DIAGNOSIS — R1010 Upper abdominal pain, unspecified: Secondary | ICD-10-CM

## 2024-06-02 DIAGNOSIS — R1310 Dysphagia, unspecified: Secondary | ICD-10-CM | POA: Diagnosis not present

## 2024-06-02 DIAGNOSIS — R197 Diarrhea, unspecified: Secondary | ICD-10-CM | POA: Diagnosis not present

## 2024-06-02 DIAGNOSIS — R1319 Other dysphagia: Secondary | ICD-10-CM

## 2024-06-02 NOTE — Progress Notes (Signed)
 "  Vicki Martin [id number] [birth year]   Chief Complaint: Trouble swallowing  Referring Provider: Joshua Debby CROME, MD Primary GI MD: Dr. Abran   HPI: Vicki Martin is a 74 y.o. female with past medical history of anxiety/depression, family history of pancreatic and stomach cancer, GERD, HLD, HTN, sleep apnea, migraines, hysterectomy, cholecystectomy who presents today for a complaint of dysphagia.    Seen by PCP 12/26/2023 for dizziness, lightheadedness, and recent falls.  At that time also endorsed dysphagia ongoing for about 6 months.  Had recently had to increase her OTC reflux medication.  Was started on esomeprazole  40 mg once daily and referred to GI for further evaluation of dysphagia. She was placed on a long-term heart monitor for bradycardia and syncope.   Labs 12/26/2023 showed normal CBC, low potassium 3.2 and was subsequently started on a supplement, normal TSH, normal magnesium , normal lipid panel.   At follow-up visit in September was referred to cardiology.    Seen by cardiology 04/10/2024.  Syncope and bradycardia suspected to be related to low blood pressures.  Stress test/coronary CTA ordered for evaluation of chest pain.  Recommendation was for GI follow-up if that was unremarkable. Workup was normal, no further cardiac workup recommended.   Discussed the use of AI scribe software for clinical note transcription with the patient, who gave verbal consent to proceed.  History of Present Illness Vicki Martin is a 74 year old female with gastroesophageal reflux disease who presents for evaluation of dysphagia.  Dysphagia - Intermittent dysphagia to solids, liquids, and pills for approximately six months - Sensation of food and liquids getting stuck in the throat - Episodes are not constant and require repeated swallowing to clear - Occasional regurgitation of food - No specific food triggers - Symptoms attributed to stress - No current odynophagia, but  previously experienced rawness and irritation - No changes in voice, appetite, or weight - Family history notable for a nephew who died of throat cancer  Gastroesophageal reflux disease - GERD well controlled with Nexium  at a higher dose since July 2025 - No heartburn or asthma symptoms while adherent to current regimen - No prior upper endoscopy  Upper abdominal pain and diarrhea - Upper abdominal pain has worsened recently, occurring about three times per week - Pain is bilateral in the upper abdomen - Pain is not clearly related to eating and improves after a bowel movement - Longstanding intermittent diarrhea - No constipation, hematochezia, or melena - Last colonoscopy in 2017 was normal    Previous GI Procedures/Imaging   Colonoscopy 09/17/2015 - The entire examined colon is normal on direct and retroflexion views.  - No specimens collected. - Recall 10 years   Past Medical History:  Diagnosis Date   Anxiety    Arthritis    Depression    Family history of pancreatic cancer    Family history of stomach cancer    GERD (gastroesophageal reflux disease)    Hyperlipidemia    no medications   Hypertension    Migraines    Sleep apnea    pt declines CPAP    Past Surgical History:  Procedure Laterality Date   ABDOMINAL HYSTERECTOMY     reports complet but with ovaries retained   CHOLECYSTECTOMY     removed hip bone to replace vertebrae      Current Outpatient Medications  Medication Sig Dispense Refill   atorvastatin  (LIPITOR) 40 MG tablet Take 1 tablet by mouth once daily 90 tablet 0  carvedilol  (COREG ) 12.5 MG tablet TAKE 1 TABLET BY MOUTH TWICE DAILY WITH A MEAL 180 tablet 0   clonazePAM  (KLONOPIN ) 0.5 MG tablet Take 1 tablet (0.5 mg total) by mouth 3 (three) times daily as needed for anxiety. 270 tablet 0   DULoxetine  (CYMBALTA ) 60 MG capsule Take 1 capsule by mouth once daily 90 capsule 0   empagliflozin  (JARDIANCE ) 10 MG TABS tablet TAKE 1 TABLET BY MOUTH  ONCE DAILY BEFORE BREAKFAST . APPOINTMENT REQUIRED FOR FUTURE REFILLS 90 tablet 1   esomeprazole  (NEXIUM ) 40 MG capsule Take 1 capsule (40 mg total) by mouth daily. 90 capsule 1   potassium chloride  (KLOR-CON ) 10 MEQ tablet TAKE 1 TABLET BY MOUTH THREE TIMES DAILY 270 tablet 0   torsemide  (DEMADEX ) 20 MG tablet TAKE 1 TABLET BY MOUTH ONCE DAILY . APPOINTMENT REQUIRED FOR FUTURE REFILLS 90 tablet 1   metoprolol  tartrate (LOPRESSOR ) 25 MG tablet Take 1 tablet (25 mg total) by mouth once for 1 dose. Take 90-120 minutes prior to scan. Hold for SBP less than 110. 1 tablet 0   No current facility-administered medications for this visit.    Allergies as of 06/02/2024 - Review Complete 06/02/2024  Allergen Reaction Noted   Codeine Other (See Comments) 07/27/2014   Penicillins Rash 07/27/2014    Family History  Problem Relation Age of Onset   Rheum arthritis Mother    Stomach cancer Mother 27   Pancreatic cancer Mother 54   Heart disease Father    Hypertension Sister    Cancer Sister        lung   Lung cancer Brother        deceased   Early death Brother    Liver cancer Brother    Hypertension Brother    COPD Brother    Ovarian cysts Daughter    Stomach cancer Maternal Aunt    Pancreatic cancer Maternal Aunt    Cancer Maternal Aunt        NOS   Cancer Maternal Uncle        NOS   Lung cancer Other    Diabetes Neg Hx    Alcohol abuse Neg Hx    Stroke Neg Hx    Colon cancer Neg Hx    Esophageal cancer Neg Hx    Rectal cancer Neg Hx     Social History[1]   Review of Systems:    Constitutional: No weight loss, fever, chills Cardiovascular: No chest pain Respiratory: No SOB  Gastrointestinal: See HPI and otherwise negative   Physical Exam:  Vital signs: BP 102/60   Pulse 66   Ht 5' 1 (1.549 m)   Wt 144 lb (65.3 kg)   BMI 27.21 kg/m   Wt Readings from Last 3 Encounters:  06/02/24 144 lb (65.3 kg)  05/15/24 145 lb (65.8 kg)  04/10/24 145 lb 3.2 oz (65.9 kg)     Constitutional: Pleasant, overweight female in NAD, alert and cooperative Head:  Normocephalic and atraumatic.  Respiratory: Respirations even and unlabored. Lungs clear to auscultation bilaterally.  No wheezes, crackles, or rhonchi.  Cardiovascular:  Regular rate and rhythm. No murmurs. No peripheral edema. Gastrointestinal:  Soft, nondistended, minimally tender to palpation of upper abdomen. No rebound or guarding. Normal bowel sounds. No appreciable masses or hepatomegaly. Rectal:  Not performed.  Neurologic:  Alert and oriented x4;  grossly normal neurologically.  Skin:   Dry and intact without significant lesions or rashes. Psychiatric: Oriented to person, place and time. Demonstrates good judgement and  reason without abnormal affect or behaviors.   Echocardiogram 06/06/2023 1. Left ventricular ejection fraction, by estimation, is 60 to 65% . Left ventricular ejection fraction by 3D volume is 64 % . The left ventricle has normal function. The left ventricle has no regional wall motion abnormalities. There is mild left ventricular hypertrophy. Left ventricular diastolic parameters are consistent with Grade I diastolic dysfunction ( impaired relaxation) .  2. Right ventricular systolic function is normal. The right ventricular size is normal. There is normal pulmonary artery systolic pressure. The estimated right ventricular systolic pressure is 19. 2 mmHg.  3. The mitral valve is normal in structure. Trivial mitral valve regurgitation.  4. The aortic valve is tricuspid. Aortic valve regurgitation is not visualized. No aortic stenosis is present.  5. The inferior vena cava is normal in size with greater than 50% respiratory variability, suggesting right atrial pressure of 3 mmHg.  Assessment/Plan:   Assessment & Plan Dysphagia Intermittent dysphagia for at least the last six months, somewhat improved with increased reflux medication but symptoms persist.  Does not appear to have had a  previous upper endoscopy.  Recent reassuring cardiac workup.  - Ordered upper endoscopy to assess for structural lesions and exclude malignancy. I thoroughly discussed the procedure with the patient to include nature of the procedure, alternatives, benefits, and risks (including but not limited to bleeding, infection, perforation, anesthesia/cardiac/pulmonary complications). Patient verbalized understanding and gave verbal consent to proceed with procedure.  - Discussed potential esophageal stricture; dilation may be performed during endoscopy if indicated. - Consider barium swallow study if endoscopy is unrevealing to evaluate esophageal motility.  Gastroesophageal reflux disease Symptoms well controlled on current Nexium  regimen.  - Advised continuation of current Nexium  regimen as prescribed by primary care.  Chronic abdominal pain with diarrhea Chronic, intermittent upper abdominal pain and longstanding intermittent diarrhea without alarming features. Pain not severe or acutely worsened.  Upper abdominal pain is mild and occurs maybe 3 times a week, and often improves after bowel movement.  Last colonoscopy 2017 was normal with recommended recall in 10 years.  - Consider CT scan or abdominal ultrasound if upper endoscopy does not identify a cause for abdominal pain. - Advised to report worsening or severe pain and seek emergency care if symptoms become severe.    Camie Furbish, PA-C [facility] 06/02/2024, 2:39 PM  Patient Care Team: Joshua Debby CROME, MD as PCP - General (Internal Medicine) Lavona Agent, MD as PCP - Cardiology (Cardiology)       [1]  Social History Tobacco Use   Smoking status: Never   Smokeless tobacco: Never  Vaping Use   Vaping status: Never Used  Substance Use Topics   Alcohol use: No    Alcohol/week: 0.0 standard drinks of alcohol   Drug use: No   "

## 2024-06-02 NOTE — Patient Instructions (Signed)
 You have been scheduled for an endoscopy. Please follow written instructions given to you at your visit today.  If you use inhalers (even only as needed), please bring them with you on the day of your procedure.  If you take any of the following medications, they will need to be adjusted prior to your procedure:   DO NOT TAKE 7 DAYS PRIOR TO TEST- Trulicity (dulaglutide) Ozempic, Wegovy (semaglutide) Mounjaro, Zepbound (tirzepatide) Bydureon Bcise (exanatide extended release)  DO NOT TAKE 1 DAY PRIOR TO YOUR TEST Rybelsus (semaglutide) Adlyxin (lixisenatide) Victoza (liraglutide) Byetta (exanatide) ___________________________________________________________________________

## 2024-06-03 NOTE — Progress Notes (Signed)
 Noted

## 2024-06-04 ENCOUNTER — Other Ambulatory Visit: Payer: Self-pay | Admitting: Medical Genetics

## 2024-06-12 ENCOUNTER — Encounter (HOSPITAL_BASED_OUTPATIENT_CLINIC_OR_DEPARTMENT_OTHER): Payer: Self-pay

## 2024-06-12 ENCOUNTER — Emergency Department (HOSPITAL_BASED_OUTPATIENT_CLINIC_OR_DEPARTMENT_OTHER)
Admission: EM | Admit: 2024-06-12 | Discharge: 2024-06-12 | Disposition: A | Discharge status: Home / Self Care | Attending: Emergency Medicine | Admitting: Emergency Medicine

## 2024-06-12 ENCOUNTER — Emergency Department (HOSPITAL_BASED_OUTPATIENT_CLINIC_OR_DEPARTMENT_OTHER)

## 2024-06-12 ENCOUNTER — Other Ambulatory Visit: Payer: Self-pay

## 2024-06-12 DIAGNOSIS — I10 Essential (primary) hypertension: Secondary | ICD-10-CM | POA: Diagnosis not present

## 2024-06-12 DIAGNOSIS — Z7901 Long term (current) use of anticoagulants: Secondary | ICD-10-CM | POA: Diagnosis not present

## 2024-06-12 DIAGNOSIS — E876 Hypokalemia: Secondary | ICD-10-CM | POA: Diagnosis not present

## 2024-06-12 DIAGNOSIS — R42 Dizziness and giddiness: Secondary | ICD-10-CM | POA: Insufficient documentation

## 2024-06-12 LAB — CBC WITH DIFFERENTIAL/PLATELET
Abs Immature Granulocytes: 0.01 K/uL (ref 0.00–0.07)
Basophils Absolute: 0.1 K/uL (ref 0.0–0.1)
Basophils Relative: 1 %
Eosinophils Absolute: 0.3 K/uL (ref 0.0–0.5)
Eosinophils Relative: 4 %
HCT: 45.6 % (ref 36.0–46.0)
Hemoglobin: 15.5 g/dL — ABNORMAL HIGH (ref 12.0–15.0)
Immature Granulocytes: 0 %
Lymphocytes Relative: 30 %
Lymphs Abs: 2.2 K/uL (ref 0.7–4.0)
MCH: 30.3 pg (ref 26.0–34.0)
MCHC: 34 g/dL (ref 30.0–36.0)
MCV: 89.1 fL (ref 80.0–100.0)
Monocytes Absolute: 0.7 K/uL (ref 0.1–1.0)
Monocytes Relative: 9 %
Neutro Abs: 4.3 K/uL (ref 1.7–7.7)
Neutrophils Relative %: 56 %
Platelets: 269 K/uL (ref 150–400)
RBC: 5.12 MIL/uL — ABNORMAL HIGH (ref 3.87–5.11)
RDW: 12.6 % (ref 11.5–15.5)
WBC: 7.5 K/uL (ref 4.0–10.5)
nRBC: 0 % (ref 0.0–0.2)

## 2024-06-12 LAB — COMPREHENSIVE METABOLIC PANEL WITH GFR
ALT: 29 U/L (ref 0–44)
AST: 37 U/L (ref 15–41)
Albumin: 4.7 g/dL (ref 3.5–5.0)
Alkaline Phosphatase: 173 U/L — ABNORMAL HIGH (ref 38–126)
Anion gap: 12 (ref 5–15)
BUN: 13 mg/dL (ref 8–23)
CO2: 30 mmol/L (ref 22–32)
Calcium: 9.6 mg/dL (ref 8.9–10.3)
Chloride: 98 mmol/L (ref 98–111)
Creatinine, Ser: 1.22 mg/dL — ABNORMAL HIGH (ref 0.44–1.00)
GFR, Estimated: 47 mL/min — ABNORMAL LOW
Glucose, Bld: 112 mg/dL — ABNORMAL HIGH (ref 70–99)
Potassium: 3.3 mmol/L — ABNORMAL LOW (ref 3.5–5.1)
Sodium: 140 mmol/L (ref 135–145)
Total Bilirubin: 0.7 mg/dL (ref 0.0–1.2)
Total Protein: 7.4 g/dL (ref 6.5–8.1)

## 2024-06-12 LAB — URINALYSIS, W/ REFLEX TO CULTURE (INFECTION SUSPECTED)
Bacteria, UA: NONE SEEN
Bilirubin Urine: NEGATIVE
Glucose, UA: 500 mg/dL — AB
Hgb urine dipstick: NEGATIVE
Ketones, ur: NEGATIVE mg/dL
Leukocytes,Ua: NEGATIVE
Nitrite: NEGATIVE
Protein, ur: NEGATIVE mg/dL
Specific Gravity, Urine: <1.005 — ABNORMAL LOW (ref 1.005–1.030)
pH: 6 (ref 5.0–8.0)

## 2024-06-12 LAB — CBG MONITORING, ED: Glucose-Capillary: 108 mg/dL — ABNORMAL HIGH (ref 70–99)

## 2024-06-12 MED ORDER — LACTATED RINGERS IV BOLUS
1000.0000 mL | Freq: Once | INTRAVENOUS | Status: AC
  Administered 2024-06-12: 1000 mL via INTRAVENOUS

## 2024-06-12 NOTE — ED Provider Notes (Signed)
 "  [place] EMERGENCY DEPARTMENT AT [facility]  Provider Note  CSN: [id number] Arrival date & time: 06/12/24 0214  History Chief Complaint  Patient presents with   Dizziness    Vicki Martin is a 74 y.o. female with history of HTN, HLD, GERD and recently having some intermittent dysphagia brought to the ED by daughter for evaluation of dizziness and weakness. Daughter reports patient lives alone and she talks to her on the phone most days. Daughter works as agricultural engineer and called patient around [employer]. On the phone she noticed patient seemed to have slurred speech. Patient told her she'd had some weakness and dizziness, stumbling into walls while walking in the house for the last 2 days. Daughter sent EMS to the house where stroke screen was negative. They recommended she come to the ED for evaluation.    Home Medications Prior to Admission medications  Medication Sig Start Date End Date Taking? Authorizing Provider  atorvastatin  (LIPITOR) 40 MG tablet Take 1 tablet by mouth once daily 04/15/24   Joshua Debby CROME, MD  carvedilol  (COREG ) 12.5 MG tablet TAKE 1 TABLET BY MOUTH TWICE DAILY WITH A MEAL 04/16/24   Joshua Debby CROME, MD  clonazePAM  (KLONOPIN ) 0.5 MG tablet Take 1 tablet (0.5 mg total) by mouth 3 (three) times daily as needed for anxiety. 02/05/24   Joshua Debby CROME, MD  DULoxetine  (CYMBALTA ) 60 MG capsule Take 1 capsule by mouth once daily 03/28/24   Joshua Debby CROME, MD  empagliflozin  (JARDIANCE ) 10 MG TABS tablet TAKE 1 TABLET BY MOUTH ONCE DAILY BEFORE BREAKFAST . APPOINTMENT REQUIRED FOR FUTURE REFILLS 01/02/24   Joshua Debby CROME, MD  esomeprazole  (NEXIUM ) 40 MG capsule Take 1 capsule (40 mg total) by mouth daily. 12/26/23   Joshua Debby CROME, MD  metoprolol  tartrate (LOPRESSOR ) 25 MG tablet Take 1 tablet (25 mg total) by mouth once for 1 dose. Take 90-120 minutes prior to scan. Hold for SBP less than 110. 04/10/24 05/15/24  Lavona Agent, MD  potassium chloride   (KLOR-CON ) 10 MEQ tablet TAKE 1 TABLET BY MOUTH THREE TIMES DAILY 05/02/24   Joshua Debby CROME, MD  torsemide  (DEMADEX ) 20 MG tablet TAKE 1 TABLET BY MOUTH ONCE DAILY . APPOINTMENT REQUIRED FOR FUTURE REFILLS 01/02/24   Joshua Debby CROME, MD     Allergies    Codeine and Penicillins   Review of Systems   Review of Systems Please see HPI for pertinent positives and negatives  Physical Exam BP 132/65   Pulse 73   Temp 98.1 F (36.7 C) (Oral)   Resp 18   Ht 5' 1 (1.549 m)   Wt 65.3 kg   SpO2 97%   BMI 27.20 kg/m   Physical Exam Vitals and nursing note reviewed.  Constitutional:      Appearance: Normal appearance.  HENT:     Head: Normocephalic and atraumatic.     Nose: Nose normal.     Mouth/Throat:     Mouth: Mucous membranes are moist.  Eyes:     General: No visual field deficit.    Extraocular Movements: Extraocular movements intact.     Conjunctiva/sclera: Conjunctivae normal.     Pupils: Pupils are equal, round, and reactive to light.     Comments: No nystagmus  Cardiovascular:     Rate and Rhythm: Normal rate.  Pulmonary:     Effort: Pulmonary effort is normal.     Breath sounds: Normal breath sounds.  Abdominal:     General: Abdomen  is flat.     Palpations: Abdomen is soft.     Tenderness: There is no abdominal tenderness.  Musculoskeletal:        General: No swelling. Normal range of motion.     Cervical back: Neck supple.  Skin:    General: Skin is warm and dry.  Neurological:     General: No focal deficit present.     Mental Status: She is alert and oriented to person, place, and time.     Cranial Nerves: No cranial nerve deficit, dysarthria or facial asymmetry.     Sensory: No sensory deficit.     Motor: No weakness or pronator drift.  Psychiatric:        Mood and Affect: Mood normal.     ED Results / Procedures / Treatments   EKG EKG Interpretation Date/Time:  Thursday June 12 2024 02:27:14 EST Ventricular Rate:  71 PR Interval:  171 QRS  Duration:  138 QT Interval:  422 QTC Calculation: 459 R Axis:   1  Text Interpretation: Sinus rhythm Left bundle branch block No significant change since last tracing Confirmed by Roselyn Dunnings [phone]) on 06/12/2024 2:42:42 AM  Procedures Procedures  Medications Ordered in the ED Medications  lactated ringers  bolus 1,000 mL (1,000 mLs Intravenous New Bag/Given 06/12/24 0234)    Initial Impression and Plan  Patient here with 2 days of dizziness and difficulty walking. She also reports poor PO intake due to her dysphagia (scheduled for EGD). She has a normal neuro exam now. No signs of stroke and outside window for intervention either way. Will check labs, CT head and give IVF for possible dehydration.   ED Course   Clinical Course as of 06/12/24 0416  Thu Jun 12, 2024  0242 CBC is unremarkable.  [CS]  [id number] I personally viewed the images from radiology studies and agree with radiologist interpretation: CT is negative.  [CS]  0306 CMP not significantly changed from baseline. [CS]  0414 UA is clear. Patient was able to walk to the bathroom with a steady gait. She reports feeling at baseline. Not having any slurred speech while in the ED. I discussed admission to the hospital for further evaluation of possible TIA vs posterior circulation stroke, but she is adamant she wants to go home and follow up with PCP as an outpatient. Recommend she continue her current medications and RTED for any worsening or worrisome symptoms.  [CS]    Clinical Course User Index [CS] Roselyn Dunnings NOVAK, MD     MDM Rules/Calculators/A&P Medical Decision Making Problems Addressed: Dizziness: acute illness or injury  Amount and/or Complexity of Data Reviewed Labs: ordered. Decision-making details documented in ED Course. Radiology: ordered and independent interpretation performed. Decision-making details documented in ED Course. ECG/medicine tests: ordered and independent interpretation performed.  Decision-making details documented in ED Course.  Risk Decision regarding hospitalization.     Final Clinical Impression(s) / ED Diagnoses Final diagnoses:  Dizziness    Rx / DC Orders ED Discharge Orders     None        Roselyn Dunnings NOVAK, MD 06/12/24 [phone]  "

## 2024-06-12 NOTE — ED Triage Notes (Signed)
 BIB daughter with c/o slurred speech that daughter noticed on the phone.  Called EMS to evaluate her until she could get there. No noticeable deficit during triage, speech appears normal.  Patient reports that symptoms started yesterday, she has been dizzy for approximately a week. Increased blurriness is her eyes for the past few days.   Aox4, resp even and unlabored. Vss.

## 2024-06-13 ENCOUNTER — Ambulatory Visit: Admitting: Cardiology

## 2024-06-19 ENCOUNTER — Encounter: Payer: Self-pay | Admitting: Internal Medicine

## 2024-06-19 ENCOUNTER — Ambulatory Visit: Admitting: Internal Medicine

## 2024-06-19 VITALS — BP 134/76 | HR 65 | Temp 97.7°F | Ht 61.0 in | Wt 145.0 lb

## 2024-06-19 DIAGNOSIS — T502X5A Adverse effect of carbonic-anhydrase inhibitors, benzothiadiazides and other diuretics, initial encounter: Secondary | ICD-10-CM | POA: Diagnosis not present

## 2024-06-19 DIAGNOSIS — I5189 Other ill-defined heart diseases: Secondary | ICD-10-CM | POA: Diagnosis not present

## 2024-06-19 DIAGNOSIS — R7303 Prediabetes: Secondary | ICD-10-CM | POA: Diagnosis not present

## 2024-06-19 DIAGNOSIS — N1832 Chronic kidney disease, stage 3b: Secondary | ICD-10-CM | POA: Diagnosis not present

## 2024-06-19 DIAGNOSIS — I10 Essential (primary) hypertension: Secondary | ICD-10-CM

## 2024-06-19 DIAGNOSIS — E785 Hyperlipidemia, unspecified: Secondary | ICD-10-CM

## 2024-06-19 DIAGNOSIS — E876 Hypokalemia: Secondary | ICD-10-CM

## 2024-06-19 LAB — BASIC METABOLIC PANEL WITH GFR
BUN: 12 mg/dL (ref 6–23)
CO2: 30 meq/L (ref 19–32)
Calcium: 9.1 mg/dL (ref 8.4–10.5)
Chloride: 101 meq/L (ref 96–112)
Creatinine, Ser: 1.03 mg/dL (ref 0.40–1.20)
GFR: 53.81 mL/min — ABNORMAL LOW
Glucose, Bld: 71 mg/dL (ref 70–99)
Potassium: 3.9 meq/L (ref 3.5–5.1)
Sodium: 138 meq/L (ref 135–145)

## 2024-06-19 LAB — HEMOGLOBIN A1C: Hgb A1c MFr Bld: 6.2 % (ref 4.6–6.5)

## 2024-06-19 LAB — CK: Total CK: 95 U/L (ref 17–177)

## 2024-06-19 LAB — MAGNESIUM: Magnesium: 2 mg/dL (ref 1.5–2.5)

## 2024-06-19 MED ORDER — EMPAGLIFLOZIN 10 MG PO TABS: 10.0000 mg | ORAL_TABLET | Freq: Every day | ORAL | 1 refills | Status: AC

## 2024-06-19 NOTE — Patient Instructions (Signed)
 Hypokalemia Hypokalemia means that the amount of potassium in the blood is lower than normal. Potassium is a mineral (electrolyte) that helps regulate the amount of fluid in the body. It also stimulates muscle tightening (contraction) and helps nerves work properly. Normally, most of the body's potassium is inside cells, and only a very small amount is in the blood. Because the amount in the blood is so small, minor changes to potassium levels in the blood can be life-threatening. What are the causes? This condition may be caused by: Antibiotic medicine. Diarrhea or vomiting. Taking too much of a medicine that helps you have a bowel movement (laxative) can cause diarrhea and lead to hypokalemia. Chronic kidney disease (CKD). Medicines that help the body get rid of excess fluid (diuretics). Eating disorders, such as anorexia or bulimia. Low magnesium levels in the body. Sweating a lot. What are the signs or symptoms? Symptoms of this condition include: Weakness. Constipation. Fatigue. Muscle cramps. Mental confusion. Skipped heartbeats or irregular heartbeat (palpitations). Tingling or numbness. How is this diagnosed? This condition is diagnosed with a blood test. How is this treated? This condition may be treated by: Taking potassium supplements. Adjusting the medicines that you take. Eating more foods that contain a lot of potassium. If your potassium level is very low, you may need to get potassium through an IV and be monitored in the hospital. Follow these instructions at home: Eating and drinking  Eat a healthy diet. A healthy diet includes fresh fruits and vegetables, whole grains, healthy fats, and lean proteins. If told, eat more foods that contain a lot of potassium. These include: Nuts, such as peanuts and pistachios. Seeds, such as sunflower seeds and pumpkin seeds. Peas, lentils, and lima beans. Whole grain and bran cereals and breads. Fresh fruits and vegetables,  such as apricots, avocado, bananas, cantaloupe, kiwi, oranges, tomatoes, asparagus, and potatoes. Juices, such as orange, tomato, and prune. Lean meats, including fish. Milk and milk products, such as yogurt. General instructions Take over-the-counter and prescription medicines only as told by your health care provider. This includes vitamins, natural food products, and supplements. Keep all follow-up visits. This is important. Contact a health care provider if: You have weakness that gets worse. You feel your heart pounding or racing. You vomit. You have diarrhea. You have diabetes and you have trouble keeping your blood sugar in your target range. Get help right away if: You have chest pain. You have shortness of breath. You have vomiting or diarrhea that lasts for more than 2 days. You faint. These symptoms may be an emergency. Get help right away. Call 911. Do not wait to see if the symptoms will go away. Do not drive yourself to the hospital. Summary Hypokalemia means that the amount of potassium in the blood is lower than normal. This condition is diagnosed with a blood test. Hypokalemia may be treated by taking potassium supplements, adjusting the medicines that you take, or eating more foods that are high in potassium. If your potassium level is very low, you may need to get potassium through an IV and be monitored in the hospital. This information is not intended to replace advice given to you by your health care provider. Make sure you discuss any questions you have with your health care provider. Document Revised: 01/27/2021 Document Reviewed: 01/27/2021 Elsevier Patient Education  2024 [employer].

## 2024-06-19 NOTE — Progress Notes (Signed)
 "  Subjective:  Patient ID: Vicki Martin, female    DOB: August 18, 1950  Age: 74 y.o. MRN: [id number]  CC: Hypertension   HPI Vicki Martin presents for f/up ---  Discussed the use of AI scribe software for clinical note transcription with the patient, who gave verbal consent to proceed.  History of Present Illness Vicki Martin is a 74 year old female who presents with episodes of dizziness and speech difficulties.  She experiences episodes of dizziness and difficulty with speech articulation. Paramedics recommended hospital evaluation, where a scan was performed but no definitive cause was identified. No lingering symptoms such as slurred speech, numbness, weakness, tingling, or trouble walking or talking are present after these episodes.  She experiences shortness of breath during usual activities such as sweeping, mopping, and preparing meals. She describes feeling 'really winded' and sometimes dizzy, necessitating a chair nearby for rest. These symptoms have persisted for about six months. She also experiences a tight feeling in her chest, but not pain. She has a family history of heart disease and has been diagnosed with a heart murmur. She reports that a heart doctor performed tests and did not find anything.  She experiences cramps in her legs and toes both during the day and night. She reports taking medication for low potassium as prescribed after a previous emergency room visit. She sometimes experiences pain in her legs when walking.  She takes medication for emotional well-being, which helps prevent depression. She feels fine emotionally as long as she adheres to her medication regimen.     Outpatient Medications Prior to Visit  Medication Sig Dispense Refill   atorvastatin  (LIPITOR) 40 MG tablet Take 1 tablet by mouth once daily 90 tablet 0   carvedilol  (COREG ) 12.5 MG tablet TAKE 1 TABLET BY MOUTH TWICE DAILY WITH A MEAL 180 tablet 0   clonazePAM  (KLONOPIN ) 0.5 MG  tablet Take 1 tablet (0.5 mg total) by mouth 3 (three) times daily as needed for anxiety. 270 tablet 0   DULoxetine  (CYMBALTA ) 60 MG capsule Take 1 capsule by mouth once daily 90 capsule 0   esomeprazole  (NEXIUM ) 40 MG capsule Take 1 capsule (40 mg total) by mouth daily. 90 capsule 1   potassium chloride  (KLOR-CON ) 10 MEQ tablet TAKE 1 TABLET BY MOUTH THREE TIMES DAILY 270 tablet 0   torsemide  (DEMADEX ) 20 MG tablet TAKE 1 TABLET BY MOUTH ONCE DAILY . APPOINTMENT REQUIRED FOR FUTURE REFILLS 90 tablet 1   empagliflozin  (JARDIANCE ) 10 MG TABS tablet TAKE 1 TABLET BY MOUTH ONCE DAILY BEFORE BREAKFAST . APPOINTMENT REQUIRED FOR FUTURE REFILLS 90 tablet 1   metoprolol  tartrate (LOPRESSOR ) 25 MG tablet Take 1 tablet (25 mg total) by mouth once for 1 dose. Take 90-120 minutes prior to scan. Hold for SBP less than 110. 1 tablet 0   No facility-administered medications prior to visit.    ROS Review of Systems  Constitutional:  Negative for appetite change, chills, diaphoresis, fatigue and fever.  HENT: Negative.    Respiratory:  Positive for shortness of breath. Negative for cough, chest tightness and wheezing.   Cardiovascular:  Negative for chest pain, palpitations and leg swelling.  Gastrointestinal: Negative.  Negative for abdominal pain, constipation, diarrhea, nausea and vomiting.  Endocrine: Negative.   Genitourinary: Negative.  Negative for difficulty urinating and dysuria.  Musculoskeletal: Negative.  Negative for arthralgias and myalgias.  Skin: Negative.   Neurological:  Negative for dizziness, weakness and light-headedness.  Hematological:  Negative for adenopathy. Does not bruise/bleed  easily.  Psychiatric/Behavioral:  Positive for decreased concentration. Negative for confusion, dysphoric mood and suicidal ideas. The patient is nervous/anxious.     Objective:  BP 134/76 (BP Location: Right Arm, Patient Position: Sitting, Cuff Size: Normal)   Pulse 65   Temp 97.7 F (36.5 C) (Oral)    Ht 5' 1 (1.549 m)   Wt 145 lb (65.8 kg)   SpO2 98%   BMI 27.40 kg/m   BP Readings from Last 3 Encounters:  06/20/24 117/65  06/19/24 134/76  06/12/24 132/65    Wt Readings from Last 3 Encounters:  06/20/24 144 lb (65.3 kg)  06/19/24 145 lb (65.8 kg)  06/12/24 143 lb 15.4 oz (65.3 kg)    Physical Exam Vitals reviewed.  Constitutional:      Appearance: Normal appearance.  HENT:     Nose: Nose normal.     Mouth/Throat:     Mouth: Mucous membranes are moist.  Eyes:     General: No scleral icterus.    Conjunctiva/sclera: Conjunctivae normal.  Cardiovascular:     Rate and Rhythm: Normal rate and regular rhythm.     Pulses:          Dorsalis pedis pulses are 1+ on the right side and 1+ on the left side.       Posterior tibial pulses are 1+ on the right side and 1+ on the left side.  Pulmonary:     Effort: Pulmonary effort is normal.     Breath sounds: No stridor. No wheezing, rhonchi or rales.  Abdominal:     General: Abdomen is flat. Bowel sounds are normal. There is no distension.     Palpations: Abdomen is soft. There is no hepatomegaly, splenomegaly or mass.     Tenderness: There is no abdominal tenderness. There is no guarding.     Hernia: No hernia is present.  Musculoskeletal:        General: Normal range of motion.     Cervical back: Neck supple.     Right lower leg: No edema.     Left lower leg: No edema.  Feet:     Right foot:     Skin integrity: Skin integrity normal.  Lymphadenopathy:     Cervical: No cervical adenopathy.  Skin:    General: Skin is warm and dry.  Neurological:     General: No focal deficit present.     Mental Status: She is alert.  Psychiatric:        Mood and Affect: Mood normal.        Behavior: Behavior normal.     Lab Results  Component Value Date   WBC 7.5 06/12/2024   HGB 15.5 (H) 06/12/2024   HCT 45.6 06/12/2024   PLT 269 06/12/2024   GLUCOSE 71 06/19/2024   CHOL 113 12/26/2023   TRIG 116.0 12/26/2023   HDL 52.70  12/26/2023   LDLDIRECT 40.0 10/24/2018   LDLCALC 37 12/26/2023   ALT 29 06/12/2024   AST 37 06/12/2024   NA 138 06/19/2024   K 3.9 06/19/2024   CL 101 06/19/2024   CREATININE 1.03 06/19/2024   BUN 12 06/19/2024   CO2 30 06/19/2024   TSH 3.63 12/26/2023   HGBA1C 6.2 06/19/2024    CT Head Wo Contrast Result Date: 06/12/2024 CLINICAL DATA:  Vertigo slurred speech, initial encounter EXAM: CT HEAD WITHOUT CONTRAST TECHNIQUE: Contiguous axial images were obtained from the base of the skull through the vertex without intravenous contrast. RADIATION DOSE REDUCTION: This exam  was performed according to the departmental dose-optimization program which includes automated exposure control, adjustment of the mA and/or kV according to patient size and/or use of iterative reconstruction technique. COMPARISON:  09/03/2019 FINDINGS: Brain: No evidence of acute infarction, hemorrhage, hydrocephalus, extra-axial collection or mass lesion/mass effect. Vascular: No hyperdense vessel or unexpected calcification. Skull: Normal. Negative for fracture or focal lesion. Sinuses/Orbits: No acute finding. Other: None. IMPRESSION: No acute intracranial abnormality noted. Electronically Signed   By: Oneil Devonshire M.D.   On: 06/12/2024 02:45    Estimated Creatinine Clearance: 42.2 mL/min (by C-G formula based on SCr of 1.03 mg/dL).   Assessment & Plan:   Essential hypertension- BP is well controlled. -     Basic metabolic panel with GFR; Future  Diuretic-induced hypokalemia -     Magnesium ; Future -     Basic metabolic panel with GFR; Future  Prediabetes -     Basic metabolic panel with GFR; Future -     Hemoglobin A1c; Future  Hyperlipidemia LDL goal <130- LDL goal achieved. Doing well on the statin  -     CK; Future  Stage 3b chronic kidney disease (HCC)- Will avoid nephrotoxic agents  -     Empagliflozin ; Take 1 tablet (10 mg total) by mouth daily.  Dispense: 90 tablet; Refill: 1  Diastolic dysfunction  without heart failure -     Empagliflozin ; Take 1 tablet (10 mg total) by mouth daily.  Dispense: 90 tablet; Refill: 1     Follow-up: Return in about 6 months (around 12/17/2024).  Debby Molt, MD "

## 2024-06-20 ENCOUNTER — Encounter: Payer: Self-pay | Admitting: Internal Medicine

## 2024-06-20 ENCOUNTER — Ambulatory Visit: Admitting: Internal Medicine

## 2024-06-20 VITALS — BP 117/65 | HR 70 | Temp 97.5°F | Resp 16 | Ht 61.0 in | Wt 144.0 lb

## 2024-06-20 DIAGNOSIS — K449 Diaphragmatic hernia without obstruction or gangrene: Secondary | ICD-10-CM

## 2024-06-20 DIAGNOSIS — Q399 Congenital malformation of esophagus, unspecified: Secondary | ICD-10-CM

## 2024-06-20 DIAGNOSIS — K222 Esophageal obstruction: Secondary | ICD-10-CM | POA: Diagnosis not present

## 2024-06-20 DIAGNOSIS — K219 Gastro-esophageal reflux disease without esophagitis: Secondary | ICD-10-CM

## 2024-06-20 DIAGNOSIS — R1319 Other dysphagia: Secondary | ICD-10-CM

## 2024-06-20 MED ORDER — SODIUM CHLORIDE 0.9 % IV SOLN: 500.0000 mL | Freq: Once | INTRAVENOUS | Status: DC

## 2024-06-20 NOTE — Progress Notes (Signed)
 Pt's states no medical or surgical changes since previsit or office visit.

## 2024-06-20 NOTE — Patient Instructions (Signed)
 Post dilation diet (see handout) Continue present medications  Follow- up with Dr. Abran in the office in 6-8 weeks  YOU HAD AN ENDOSCOPIC PROCEDURE TODAY AT THE [facility]:   Refer to the procedure report that was given to you for any specific questions about what was found during the examination.  If the procedure report does not answer your questions, please call your gastroenterologist to clarify.  If you requested that your care partner not be given the details of your procedure findings, then the procedure report has been included in a sealed envelope for you to review at your convenience later.  YOU SHOULD EXPECT: Some feelings of bloating in the abdomen. Passage of more gas than usual.  Walking can help get rid of the air that was put into your GI tract during the procedure and reduce the bloating. If you had a lower endoscopy (such as a colonoscopy or flexible sigmoidoscopy) you may notice spotting of blood in your stool or on the toilet paper. If you underwent a bowel prep for your procedure, you may not have a normal bowel movement for a few days.  Please Note:  You might notice some irritation and congestion in your nose or some drainage.  This is from the oxygen used during your procedure.  There is no need for concern and it should clear up in a day or so.  SYMPTOMS TO REPORT IMMEDIATELY: Following upper endoscopy (EGD)  Vomiting of blood or coffee ground material  New chest pain or pain under the shoulder blades  Painful or persistently difficult swallowing  New shortness of breath  Fever of 100F or higher  Black, tarry-looking stools  For urgent or emergent issues, a gastroenterologist can be reached at any hour by calling [phone]. Do not use MyChart messaging for urgent concerns.   DIET:  We do recommend a small meal at first, but then you may proceed to your regular diet.  Drink plenty of fluids but you should avoid alcoholic beverages for 24  hours.  ACTIVITY:  You should plan to take it easy for the rest of today and you should NOT DRIVE or use heavy machinery until tomorrow (because of the sedation medicines used during the test).    FOLLOW UP: Our staff will call the number listed on your records the next business day following your procedure.  We will call around 7:15- 8:00 am to check on you and address any questions or concerns that you may have regarding the information given to you following your procedure. If we do not reach you, we will leave a message.     If any biopsies were taken you will be contacted by phone or by letter within the next 1-3 weeks.  Please call us  at [phone] if you have not heard about the biopsies in 3 weeks.   SIGNATURES/CONFIDENTIALITY: You and/or your care partner have signed paperwork which will be entered into your electronic medical record.  These signatures attest to the fact that that the information above on your After Visit Summary has been reviewed and is understood.  Full responsibility of the confidentiality of this discharge information lies with you and/or your care-partner.

## 2024-06-20 NOTE — Progress Notes (Signed)
 Called to room to assist during endoscopic procedure.  Patient ID and intended procedure confirmed with present staff. Received instructions for my participation in the procedure from the performing physician.

## 2024-06-20 NOTE — Progress Notes (Unsigned)
 Expand All Collapse All    Vicki Martin [id number] 1951/05/15     Chief Complaint: Trouble swallowing   Referring Provider: Joshua Debby CROME, MD Primary GI MD: Dr. Abran    HPI: Vicki Martin is a 74 y.o. female with past medical history of anxiety/depression, family history of pancreatic and stomach cancer, GERD, HLD, HTN, sleep apnea, migraines, hysterectomy, cholecystectomy who presents today for a complaint of dysphagia.     Seen by PCP 12/26/2023 for dizziness, lightheadedness, and recent falls.  At that time also endorsed dysphagia ongoing for about 6 months.  Had recently had to increase her OTC reflux medication.  Was started on esomeprazole  40 mg once daily and referred to GI for further evaluation of dysphagia. She was placed on a long-term heart monitor for bradycardia and syncope.   Labs 12/26/2023 showed normal CBC, low potassium 3.2 and was subsequently started on a supplement, normal TSH, normal magnesium , normal lipid panel.   At follow-up visit in September was referred to cardiology.     Seen by cardiology 04/10/2024.  Syncope and bradycardia suspected to be related to low blood pressures.  Stress test/coronary CTA ordered for evaluation of chest pain.  Recommendation was for GI follow-up if that was unremarkable. Workup was normal, no further cardiac workup recommended.     Discussed the use of AI scribe software for clinical note transcription with the patient, who gave verbal consent to proceed.   History of Present Illness Vicki Martin is a 74 year old female with gastroesophageal reflux disease who presents for evaluation of dysphagia.   Dysphagia - Intermittent dysphagia to solids, liquids, and pills for approximately six months - Sensation of food and liquids getting stuck in the throat - Episodes are not constant and require repeated swallowing to clear - Occasional regurgitation of food - No specific food triggers - Symptoms attributed to  stress - No current odynophagia, but previously experienced rawness and irritation - No changes in voice, appetite, or weight - Family history notable for a nephew who died of throat cancer   Gastroesophageal reflux disease - GERD well controlled with Nexium  at a higher dose since July 2025 - No heartburn or asthma symptoms while adherent to current regimen - No prior upper endoscopy   Upper abdominal pain and diarrhea - Upper abdominal pain has worsened recently, occurring about three times per week - Pain is bilateral in the upper abdomen - Pain is not clearly related to eating and improves after a bowel movement - Longstanding intermittent diarrhea - No constipation, hematochezia, or melena - Last colonoscopy in 2017 was normal       Previous GI Procedures/Imaging    Colonoscopy 09/17/2015 - The entire examined colon is normal on direct and retroflexion views.  - No specimens collected. - Recall 10 years         Past Medical History:  Diagnosis Date   Anxiety     Arthritis     Depression     Family history of pancreatic cancer     Family history of stomach cancer     GERD (gastroesophageal reflux disease)     Hyperlipidemia      no medications   Hypertension     Migraines     Sleep apnea      pt declines CPAP               Past Surgical History:  Procedure Laterality Date   ABDOMINAL HYSTERECTOMY  reports complet but with ovaries retained   CHOLECYSTECTOMY       removed hip bone to replace vertebrae                    Current Outpatient Medications  Medication Sig Dispense Refill   atorvastatin  (LIPITOR) 40 MG tablet Take 1 tablet by mouth once daily 90 tablet 0   carvedilol  (COREG ) 12.5 MG tablet TAKE 1 TABLET BY MOUTH TWICE DAILY WITH A MEAL 180 tablet 0   clonazePAM  (KLONOPIN ) 0.5 MG tablet Take 1 tablet (0.5 mg total) by mouth 3 (three) times daily as needed for anxiety. 270 tablet 0   DULoxetine  (CYMBALTA ) 60 MG capsule Take 1 capsule by  mouth once daily 90 capsule 0   empagliflozin  (JARDIANCE ) 10 MG TABS tablet TAKE 1 TABLET BY MOUTH ONCE DAILY BEFORE BREAKFAST . APPOINTMENT REQUIRED FOR FUTURE REFILLS 90 tablet 1   esomeprazole  (NEXIUM ) 40 MG capsule Take 1 capsule (40 mg total) by mouth daily. 90 capsule 1   potassium chloride  (KLOR-CON ) 10 MEQ tablet TAKE 1 TABLET BY MOUTH THREE TIMES DAILY 270 tablet 0   torsemide  (DEMADEX ) 20 MG tablet TAKE 1 TABLET BY MOUTH ONCE DAILY . APPOINTMENT REQUIRED FOR FUTURE REFILLS 90 tablet 1   metoprolol  tartrate (LOPRESSOR ) 25 MG tablet Take 1 tablet (25 mg total) by mouth once for 1 dose. Take 90-120 minutes prior to scan. Hold for SBP less than 110. 1 tablet 0      No current facility-administered medications for this visit.             Allergies as of 06/02/2024 - Review Complete 06/02/2024  Allergen Reaction Noted   Codeine Other (See Comments) 07/27/2014   Penicillins Rash 07/27/2014           Family History  Problem Relation Age of Onset   Rheum arthritis Mother     Stomach cancer Mother 29   Pancreatic cancer Mother 33   Heart disease Father     Hypertension Sister     Cancer Sister          lung   Lung cancer Brother          deceased   Early death Brother     Liver cancer Brother     Hypertension Brother     COPD Brother     Ovarian cysts Daughter     Stomach cancer Maternal Aunt     Pancreatic cancer Maternal Aunt     Cancer Maternal Aunt          NOS   Cancer Maternal Uncle          NOS   Lung cancer Other     Diabetes Neg Hx     Alcohol abuse Neg Hx     Stroke Neg Hx     Colon cancer Neg Hx     Esophageal cancer Neg Hx     Rectal cancer Neg Hx            [Social History]   [Social History]      Tobacco Use   Smoking status: Never   Smokeless tobacco: Never  Vaping Use   Vaping status: Never Used  Substance Use Topics   Alcohol use: No      Alcohol/week: 0.0 standard drinks of alcohol   Drug use: No     Review of Systems:     Constitutional: No weight loss, fever, chills Cardiovascular: No chest pain Respiratory: No SOB  Gastrointestinal: See HPI and otherwise negative    Physical Exam:  Vital signs: BP 102/60   Pulse 66   Ht 5' 1 (1.549 m)   Wt 144 lb (65.3 kg)   BMI 27.21 kg/m       Wt Readings from Last 3 Encounters:  06/02/24 144 lb (65.3 kg)  05/15/24 145 lb (65.8 kg)  04/10/24 145 lb 3.2 oz (65.9 kg)    Constitutional: Pleasant, overweight female in NAD, alert and cooperative Head:  Normocephalic and atraumatic.  Respiratory: Respirations even and unlabored. Lungs clear to auscultation bilaterally.  No wheezes, crackles, or rhonchi.  Cardiovascular:  Regular rate and rhythm. No murmurs. No peripheral edema. Gastrointestinal:  Soft, nondistended, minimally tender to palpation of upper abdomen. No rebound or guarding. Normal bowel sounds. No appreciable masses or hepatomegaly. Rectal:  Not performed.  Neurologic:  Alert and oriented x4;  grossly normal neurologically.  Skin:   Dry and intact without significant lesions or rashes. Psychiatric: Oriented to person, place and time. Demonstrates good judgement and reason without abnormal affect or behaviors.     Echocardiogram 06/06/2023 1. Left ventricular ejection fraction, by estimation, is 60 to 65% . Left ventricular ejection fraction by 3D volume is 64 % . The left ventricle has normal function. The left ventricle has no regional wall motion abnormalities. There is mild left ventricular hypertrophy. Left ventricular diastolic parameters are consistent with Grade I diastolic dysfunction ( impaired relaxation) .  2. Right ventricular systolic function is normal. The right ventricular size is normal. There is normal pulmonary artery systolic pressure. The estimated right ventricular systolic pressure is 19. 2 mmHg.  3. The mitral valve is normal in structure. Trivial mitral valve regurgitation.  4. The aortic valve is tricuspid. Aortic valve  regurgitation is not visualized. No aortic stenosis is present.  5. The inferior vena cava is normal in size with greater than 50% respiratory variability, suggesting right atrial pressure of 3 mmHg.   Assessment/Plan:    Assessment & Plan Dysphagia Intermittent dysphagia for at least the last six months, somewhat improved with increased reflux medication but symptoms persist.  Does not appear to have had a previous upper endoscopy.  Recent reassuring cardiac workup.   - Ordered upper endoscopy to assess for structural lesions and exclude malignancy. I thoroughly discussed the procedure with the patient to include nature of the procedure, alternatives, benefits, and risks (including but not limited to bleeding, infection, perforation, anesthesia/cardiac/pulmonary complications). Patient verbalized understanding and gave verbal consent to proceed with procedure.  - Discussed potential esophageal stricture; dilation may be performed during endoscopy if indicated. - Consider barium swallow study if endoscopy is unrevealing to evaluate esophageal motility.   Gastroesophageal reflux disease Symptoms well controlled on current Nexium  regimen.   - Advised continuation of current Nexium  regimen as prescribed by primary care.   Chronic abdominal pain with diarrhea Chronic, intermittent upper abdominal pain and longstanding intermittent diarrhea without alarming features. Pain not severe or acutely worsened.  Upper abdominal pain is mild and occurs maybe 3 times a week, and often improves after bowel movement.  Last colonoscopy 2017 was normal with recommended recall in 10 years.   - Consider CT scan or abdominal ultrasound if upper endoscopy does not identify a cause for abdominal pain. - Advised to report worsening or severe pain and seek emergency care if symptoms become severe.       Vicki Furbish, PA-C [facility] 06/02/2024, 2:39 PM      Recent HPI as above.  After EGD with  esophageal dilation.

## 2024-06-20 NOTE — Op Note (Signed)
 [facility] Patient Name: Vicki Martin Procedure Date: 06/20/2024 3:14 PM MRN: [id number] Endoscopist: Norleen SAILOR. Abran , MD, [id number] Age: 74 Referring MD:  Date of Birth: August 19, 1950 Gender: Female Account #: [id number] Procedure:                Upper GI endoscopy with balloon dilation of the                            esophagus. 18 mm max Indications:              Dysphagia, Therapeutic procedure, Esophageal reflux Medicines:                Monitored Anesthesia Care Procedure:                Pre-Anesthesia Assessment:                           - Prior to the procedure, a History and Physical                            was performed, and patient medications and                            allergies were reviewed. The patient's tolerance of                            previous anesthesia was also reviewed. The risks                            and benefits of the procedure and the sedation                            options and risks were discussed with the patient.                            All questions were answered, and informed consent                            was obtained. Prior Anticoagulants: The patient has                            taken no anticoagulant or antiplatelet agents. ASA                            Grade Assessment: II - A patient with mild systemic                            disease. After reviewing the risks and benefits,                            the patient was deemed in satisfactory condition to                            undergo the procedure.  After obtaining informed consent, the endoscope was                            passed under direct vision. Throughout the                            procedure, the patient's blood pressure, pulse, and                            oxygen saturations were monitored continuously. The                            Olympus Scope SN [id number] was introduced through the                             mouth, and advanced to the second part of duodenum.                            The upper GI endoscopy was accomplished without                            difficulty. The patient tolerated the procedure                            well. Scope In: Scope Out: Findings:                 One benign-appearing ringlike stricture, intrinsic                            moderate stenosis was found 35 cm from the                            incisors. This stenosis measured 1.5 cm (inner                            diameter). A TTS dilator was passed through the                            scope. Dilation with a 15-16.5-18 mm balloon                            dilator was performed to 18 mm. The ring                            demonstrated good mucosal disruption.                           The exam of the esophagus found it to be a bit                            tortuous, but was otherwise normal.                           The  stomach was normal except for a hiatal hernia.                           The examined duodenum was normal.                           The cardia and gastric fundus were normal on                            retroflexion. Complications:            No immediate complications. Estimated Blood Loss:     Estimated blood loss: none. Impression:               - Benign-appearing esophageal stenosis. Dilated.                           - Normal stomach. Hiatal hernia.                           - Normal examined duodenum.                           - No specimens collected. Recommendation:           1. Patient has a contact number available for                            emergencies. The signs and symptoms of potential                            delayed complications were discussed with the                            patient. Return to normal activities tomorrow.                            Written discharge instructions were provided to the                            patient.                            2. Post dilation diet.                           3. Continue present medications.                           4. Follow-up with Dr. Abran in the office in 6 to 8                            weeks Norleen SAILOR. Abran, MD 06/20/2024 3:52:43 PM This report has been signed electronically.

## 2024-06-20 NOTE — Progress Notes (Unsigned)
 Vss nad trans to pacu

## 2024-06-21 ENCOUNTER — Ambulatory Visit: Payer: Self-pay | Admitting: Internal Medicine

## 2024-06-24 ENCOUNTER — Other Ambulatory Visit: Payer: Self-pay | Admitting: Internal Medicine

## 2024-06-24 ENCOUNTER — Telehealth: Payer: Self-pay | Admitting: *Deleted

## 2024-06-24 DIAGNOSIS — F332 Major depressive disorder, recurrent severe without psychotic features: Secondary | ICD-10-CM

## 2024-06-24 DIAGNOSIS — I5189 Other ill-defined heart diseases: Secondary | ICD-10-CM

## 2024-06-24 DIAGNOSIS — I10 Essential (primary) hypertension: Secondary | ICD-10-CM

## 2024-06-24 DIAGNOSIS — K2100 Gastro-esophageal reflux disease with esophagitis, without bleeding: Secondary | ICD-10-CM

## 2024-06-24 DIAGNOSIS — F410 Panic disorder [episodic paroxysmal anxiety] without agoraphobia: Secondary | ICD-10-CM

## 2024-06-24 DIAGNOSIS — E876 Hypokalemia: Secondary | ICD-10-CM

## 2024-06-24 NOTE — Telephone Encounter (Signed)
" °  Follow up Call-     06/20/2024    3:16 PM  Call back number  Post procedure Call Back phone  # [phone]  Permission to leave phone message Yes   Left message to call back if any questions or concerns "

## 2024-07-24 ENCOUNTER — Other Ambulatory Visit: Payer: Self-pay

## 2024-12-17 ENCOUNTER — Ambulatory Visit: Admitting: Internal Medicine

## 2025-05-20 ENCOUNTER — Encounter: Admitting: Internal Medicine

## 2025-05-20 ENCOUNTER — Ambulatory Visit
